# Patient Record
Sex: Female | Born: 1955 | Race: White | Hispanic: No | Smoking: Current every day smoker
Health system: Southern US, Community
[De-identification: ages and names within clinical notes are randomized; demographics above are authoritative.]

## PROBLEM LIST (undated history)

## (undated) ENCOUNTER — Emergency Department (HOSPITAL_COMMUNITY): Admission: EM | Payer: No Typology Code available for payment source | Source: Home / Self Care

## (undated) DIAGNOSIS — G40909 Epilepsy, unspecified, not intractable, without status epilepticus: Secondary | ICD-10-CM

## (undated) DIAGNOSIS — E119 Type 2 diabetes mellitus without complications: Secondary | ICD-10-CM

## (undated) DIAGNOSIS — C539 Malignant neoplasm of cervix uteri, unspecified: Secondary | ICD-10-CM

## (undated) DIAGNOSIS — C801 Malignant (primary) neoplasm, unspecified: Secondary | ICD-10-CM

## (undated) DIAGNOSIS — F419 Anxiety disorder, unspecified: Secondary | ICD-10-CM

## (undated) DIAGNOSIS — F32A Depression, unspecified: Secondary | ICD-10-CM

## (undated) DIAGNOSIS — D649 Anemia, unspecified: Secondary | ICD-10-CM

## (undated) DIAGNOSIS — F329 Major depressive disorder, single episode, unspecified: Secondary | ICD-10-CM

## (undated) DIAGNOSIS — I10 Essential (primary) hypertension: Secondary | ICD-10-CM

## (undated) HISTORY — DX: Malignant neoplasm of cervix uteri, unspecified: C53.9

## (undated) HISTORY — PX: OTHER SURGICAL HISTORY: SHX169

---

## 1999-01-08 ENCOUNTER — Emergency Department (HOSPITAL_COMMUNITY): Admission: EM | Admit: 1999-01-08 | Discharge: 1999-01-08 | Payer: Self-pay

## 1999-08-11 ENCOUNTER — Emergency Department (HOSPITAL_COMMUNITY): Admission: EM | Admit: 1999-08-11 | Discharge: 1999-08-11 | Payer: Self-pay | Admitting: Emergency Medicine

## 1999-10-24 ENCOUNTER — Emergency Department (HOSPITAL_COMMUNITY): Admission: EM | Admit: 1999-10-24 | Discharge: 1999-10-24 | Payer: Self-pay | Admitting: Emergency Medicine

## 2000-07-01 ENCOUNTER — Emergency Department (HOSPITAL_COMMUNITY): Admission: EM | Admit: 2000-07-01 | Discharge: 2000-07-01 | Payer: Self-pay | Admitting: Emergency Medicine

## 2000-07-04 ENCOUNTER — Encounter (HOSPITAL_COMMUNITY): Admission: RE | Admit: 2000-07-04 | Discharge: 2000-10-02 | Payer: Self-pay | Admitting: Emergency Medicine

## 2001-12-09 ENCOUNTER — Emergency Department (HOSPITAL_COMMUNITY): Admission: EM | Admit: 2001-12-09 | Discharge: 2001-12-09 | Payer: Self-pay | Admitting: Emergency Medicine

## 2004-08-16 ENCOUNTER — Emergency Department (HOSPITAL_COMMUNITY): Admission: EM | Admit: 2004-08-16 | Discharge: 2004-08-16 | Payer: Self-pay | Admitting: Emergency Medicine

## 2012-09-24 ENCOUNTER — Emergency Department (HOSPITAL_COMMUNITY)
Admission: EM | Admit: 2012-09-24 | Discharge: 2012-09-24 | Disposition: A | Discharge status: Home / Self Care | Payer: Self-pay | Attending: Emergency Medicine | Admitting: Emergency Medicine

## 2012-09-24 ENCOUNTER — Encounter (HOSPITAL_COMMUNITY): Payer: Self-pay | Admitting: Emergency Medicine

## 2012-09-24 DIAGNOSIS — Z8669 Personal history of other diseases of the nervous system and sense organs: Secondary | ICD-10-CM | POA: Insufficient documentation

## 2012-09-24 DIAGNOSIS — K029 Dental caries, unspecified: Secondary | ICD-10-CM | POA: Insufficient documentation

## 2012-09-24 HISTORY — DX: Epilepsy, unspecified, not intractable, without status epilepticus: G40.909

## 2012-09-24 MED ORDER — ALUM & MAG HYDROXIDE-SIMETH 200-200-20 MG/5ML PO SUSP
15.0000 mL | Freq: Once | ORAL | Status: AC
  Administered 2012-09-24: 15 mL via ORAL
  Filled 2012-09-24: qty 30

## 2012-09-24 MED ORDER — HYDROCODONE-ACETAMINOPHEN 5-325 MG PO TABS: 2.0000 | ORAL_TABLET | Freq: Four times a day (QID) | ORAL | Status: DC | PRN

## 2012-09-24 NOTE — ED Provider Notes (Signed)
Medical screening examination/treatment/procedure(s) were performed by non-physician practitioner and as supervising physician I was immediately available for consultation/collaboration.   David H Yao, MD 09/24/12 0600 

## 2012-09-24 NOTE — ED Notes (Signed)
Pt up at desk stating that she would like to leave.  Pt informed that she should be seen shortly.  Pt now back in room

## 2012-09-24 NOTE — ED Notes (Signed)
PT. REPORTS PERSISTENT LEFT UPPER MOLAR PAIN ONSET YESTERDAY UNRELIEVED BY OTC PAIN MEDICATION .

## 2012-09-24 NOTE — Discharge Instructions (Signed)
Dental Caries  Tooth decay (dental caries, cavities) is the most common of all oral diseases. It occurs in all ages but is more common in children and young adults.  CAUSES  Bacteria in your mouth combine with foods (particularly sugars and starches) to produce plaque. Plaque is a substance that sticks to the hard surfaces of teeth. The bacteria in the plaque produce acids that attack the enamel of teeth. Repeated acid attacks dissolve the enamel and create holes in the teeth. Root surfaces of teeth may also get these holes.  Other contributing factors include:   Frequent snacking and drinking of cavity-producing foods and liquids.  Poor oral hygiene.  Dry mouth.  Substance abuse such as methamphetamine.  Broken or poor fitting dental restorations.  Eating disorders.  Gastroesophageal reflux disease (GERD).  Certain radiation treatments to the head and neck. SYMPTOMS  At first, dental decay appears as white, chalky areas on the enamel. In this early stage, symptoms are seldom present. As the decay progresses, pits and holes may appear on the enamel surfaces. Progression of the decay will lead to softening of the hard layers of the tooth. At this point you may experience some pain or achy feeling after sweet, hot, or cold foods or drinks are consumed. If left untreated, the decay will reach the internal structures of the tooth and produce severe pain. Extensive dental treatment, such as root canal therapy, may be needed to save the tooth at this late stage of decay development.  DIAGNOSIS  Most cavities will be detected during regular check-ups. A thorough medical and dental history will be taken by the dentist. The dentist will use instruments to check the surfaces of your teeth for any breakdown or discoloration. Some dentists have special instruments, such as lasers, that detect tooth decay. Dental X-rays may also show some cavities that are not visible to the eye (such as between the  contact areas of the teeth). TREATMENT  Treatment involves removal of the tooth decay and replacement with a restorative material such as silver, gold, or composite (white) material. However, if the decay involves a large area of the tooth and there is little remaining healthy tooth structure, a cap (crown) will be fitted over the remaining structure. If the decay involves the center part of the tooth (pulp), root canal treatment will be needed before any type of dental restoration is placed. If the tooth is severely destroyed by the decay process, leaving the remaining tooth structures unrestorable, the tooth will need to be pulled (extracted). Some early tooth decay may be reversed by fluoride treatments and thorough brushing and flossing at home. PREVENTION   Eat healthy foods. Restrict the amount of sugary, starchy foods and liquids you consume. Avoid frequent snacking and drinking of unhealthy foods and liquids.  Sealants can help with prevention of cavities. Sealants are composite resins applied onto the biting surfaces of teeth at risk for decay. They smooth out the pits and grooves and prevent food from being trapped in them. This is done in early childhood before tooth decay has started.  Fluoride tablets may also be prescribed to children between 6 months and 4 years of age if your drinking water is not fluoridated. The fluoride absorbed by the tooth enamel makes teeth less susceptible to decay. Thorough daily cleaning with a toothbrush and dental floss is the best way to prevent cavities. Use of a fluoride toothpaste is highly recommended. Fluoride mouth rinses may be used in specific cases.  Topical application of  fluoride by your dentist is important in children.  Regular visits with a dentist for checkups and cleanings are also important. SEEK IMMEDIATE DENTAL CARE IF:  You have a fever.  You develop redness and swelling of your face, jaw, or neck.  You develop swelling around a  tooth.  You are unable to open your mouth or cannot swallow.  You have severe pain uncontrolled by pain medicine. Document Released: 02/23/2002 Document Revised: 08/26/2011 Document Reviewed: 11/08/2010 [facility] Patient Information 2013 [place], [place]. Been referred to a dentist, to please call first thing in the morning telling him that you referred through the emergency department, and they will make arrangements to see, you in the next one to 2, days

## 2012-09-24 NOTE — ED Provider Notes (Signed)
History     CSN: [id number]  Arrival date & time 09/24/12  0109   First MD Initiated Contact with Patient 09/24/12 0308      Chief Complaint  Patient presents with  . Dental Pain    (Consider location/radiation/quality/duration/timing/severity/associated sxs/prior treatment) HPI Comments: Sensation loss of feeling in her left upper second molar.  Several weeks, ago.  Pain has been increasing since that time.  She has not contacted her dentist  Patient is a 57 y.o. female presenting with tooth pain. The history is provided by the patient.  Dental PainThe primary symptoms include mouth pain. Primary symptoms do not include dental injury, headaches or fever. The symptoms began more than 1 week ago. The symptoms are worsening. The symptoms occur constantly.    Past Medical History  Diagnosis Date  . Epilepsy     History reviewed. No pertinent past surgical history.  No family history on file.  History  Substance Use Topics  . Smoking status: Not on file  . Smokeless tobacco: Not on file  . Alcohol Use: Not on file    OB History   Grav Para Term Preterm Abortions TAB SAB Ect Mult Living                  Review of Systems  Unable to perform ROS Constitutional: Negative for fever.  HENT: Positive for dental problem.   Neurological: Negative for headaches.  All other systems reviewed and are negative.    Allergies  Review of patient's allergies indicates not on file.  Home Medications   Current Outpatient Rx  Name  Route  Sig  Dispense  Refill  . HYDROcodone-acetaminophen (NORCO/VICODIN) 5-325 MG per tablet   Oral   Take 2 tablets by mouth every 6 (six) hours as needed for pain.   30 tablet   0     BP 136/87  Pulse 87  Temp(Src) 98.5 F (36.9 C) (Oral)  Resp 14  SpO2 97%  Physical Exam  Constitutional: She is oriented to person, place, and time. She appears well-developed and well-nourished.  HENT:  Head: Normocephalic and atraumatic.    Mouth/Throat:    Eyes: Pupils are equal, round, and reactive to light.  Neck: Normal range of motion.  Cardiovascular: Normal rate.   Pulmonary/Chest: Effort normal.  Musculoskeletal: Normal range of motion.  Lymphadenopathy:    She has no cervical adenopathy.  Neurological: She is alert and oriented to person, place, and time.    ED Course  Dental Date/Time: 09/24/2012 3:49 AM Performed by: Arman Filter Authorized by: Arman Filter Consent: Verbal consent obtained. Risks and benefits: risks, benefits and alternatives were discussed Consent given by: patient Patient understanding: patient states understanding of the procedure being performed Patient identity confirmed: verbally with patient Time out: Immediately prior to procedure a "time out" was called to verify the correct patient, procedure, equipment, support staff and site/side marked as required. Patient tolerance: Patient tolerated the procedure well with no immediate complications.   (including critical care time)  Labs Reviewed - No data to display No results found.   1. Dental cavity       MDM  Dental block performed patient tolerated procedure well.  She is referred to Dr. Dr. Russella Dar for followup in the morning        Arman Filter, NP 09/24/12 0349  Arman Filter, NP 09/24/12 0350  Arman Filter, NP 09/24/12 [phone]

## 2013-10-29 ENCOUNTER — Encounter (HOSPITAL_COMMUNITY): Payer: Self-pay | Admitting: Emergency Medicine

## 2013-10-29 ENCOUNTER — Emergency Department (HOSPITAL_COMMUNITY)
Admission: EM | Admit: 2013-10-29 | Discharge: 2013-10-29 | Discharge status: Against Medical Advice | Payer: Self-pay | Attending: Emergency Medicine | Admitting: Emergency Medicine

## 2013-10-29 DIAGNOSIS — R52 Pain, unspecified: Secondary | ICD-10-CM | POA: Insufficient documentation
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
## 2013-10-29 NOTE — ED Notes (Signed)
Pt is displaying manic behavior, flight of ideas.

## 2013-10-29 NOTE — ED Notes (Signed)
Pt smoking in her room.  Explained that she could not smoke in the hospital.  Pt voices understanding and apologizes for same.  Cigs. Removed and placed at nurses station.  Pt st's that she needs to leave due to  Having a MR child at home alone and needs to get back to him.  AMA signed and pt left to catch a bus home.

## 2013-10-29 NOTE — ED Notes (Signed)
Pt states her PCP is "Dr. Darrol Poke". States he was supposed to put her in a nursing home.

## 2013-11-08 ENCOUNTER — Encounter (HOSPITAL_COMMUNITY): Payer: Self-pay | Admitting: Emergency Medicine

## 2013-11-08 ENCOUNTER — Emergency Department (HOSPITAL_COMMUNITY)
Admission: EM | Admit: 2013-11-08 | Discharge: 2013-11-08 | Disposition: A | Discharge status: Home / Self Care | Payer: Self-pay | Attending: Emergency Medicine | Admitting: Emergency Medicine

## 2013-11-08 DIAGNOSIS — Z79899 Other long term (current) drug therapy: Secondary | ICD-10-CM | POA: Insufficient documentation

## 2013-11-08 DIAGNOSIS — Z880 Allergy status to penicillin: Secondary | ICD-10-CM | POA: Insufficient documentation

## 2013-11-08 DIAGNOSIS — G40909 Epilepsy, unspecified, not intractable, without status epilepticus: Secondary | ICD-10-CM | POA: Insufficient documentation

## 2013-11-08 DIAGNOSIS — R21 Rash and other nonspecific skin eruption: Secondary | ICD-10-CM | POA: Insufficient documentation

## 2013-11-08 MED ORDER — HYDROCORTISONE 2.5 % EX LOTN: TOPICAL_LOTION | Freq: Two times a day (BID) | CUTANEOUS | Status: DC

## 2013-11-08 NOTE — ED Provider Notes (Signed)
CSN: [id number]     Arrival date & time 11/08/13  1248 History   First MD Initiated Contact with Patient 11/08/13 1328    This chart was scribed for Margarita Sermons, a non-physician practitioner working with Threasa Beards, MD by Denice Bors, ED Scribe. This patient was seen in room TR08C/TR08C and the patient's care was started at 2:40 PM     Chief Complaint  Patient presents with  . Facial Pain     (Consider location/radiation/quality/duration/timing/severity/associated sxs/prior Treatment) The history is provided by the patient. No language interpreter was used.   HPI Comments: Latoya Green is a 58 y.o. female who presents to the Emergency Department complaining of persistent left facial rash onset 18 months. Describes rash as gradually improving in severity and mildly pruritic.  Reports associated redness. Denies homicidal ideations and suicidal ideations. Denies associated fever, itchiness, chills, nausea, emesis, drainage, and recent travel. States she was evaluated by PCP for same.   Past Medical History  Diagnosis Date  . Epilepsy    History reviewed. No pertinent past surgical history. History reviewed. No pertinent family history. History  Substance Use Topics  . Smoking status: Not on file  . Smokeless tobacco: Not on file  . Alcohol Use: No   OB History   Grav Para Term Preterm Abortions TAB SAB Ect Mult Living                 Review of Systems  Constitutional: Negative for fever and chills.  Skin: Positive for rash.      Allergies  Penicillins  Home Medications   Prior to Admission medications   Medication Sig Start Date End Date Taking? Authorizing Provider  carbamazepine (CARBATROL) 200 MG 12 hr capsule Take 200 mg by mouth 2 (two) times daily.   Yes Historical Provider, MD  sertraline (ZOLOFT) 100 MG tablet Take 100 mg by mouth daily.   Yes Historical Provider, MD   BP 145/85  Pulse 99  Temp(Src) 98.3 F (36.8 C) (Oral)  Resp 18   SpO2 98% Physical Exam  Nursing note and vitals reviewed. Constitutional: She is oriented to person, place, and time. She appears well-developed and well-nourished. No distress.  HENT:  Head: Normocephalic and atraumatic.  Mouth/Throat: Uvula is midline, oropharynx is clear and moist and mucous membranes are normal. No oropharyngeal exudate, posterior oropharyngeal edema or posterior oropharyngeal erythema.  Eyes: EOM are normal.  Neck: Neck supple.  Cardiovascular: Normal rate and regular rhythm.   Pulmonary/Chest: Effort normal and breath sounds normal. No respiratory distress.  Musculoskeletal: Normal range of motion.  Neurological: She is alert and oriented to person, place, and time.  Skin: Skin is warm and dry. Rash noted. There is erythema.  Left face: Erythematous, scaly papular rash (several papules)  Psychiatric: She has a normal mood and affect. Her behavior is normal.    ED Course  Procedures (including critical care time) COORDINATION OF CARE:  Nursing notes reviewed. Vital signs reviewed. Initial pt interview and examination performed.   Filed Vitals:   11/08/13 1316  BP: 145/85  Pulse: 99  Temp: 98.3 F (36.8 C)  TempSrc: Oral  Resp: 18  SpO2: 98%    2:50 PM-Discussed work up plan with pt at bedside, which includes No orders of the defined types were placed in this encounter.  . Pt agrees with plan.   Treatment plan initiated:Medications - No data to display   Initial diagnostic testing ordered.      Labs Review Labs Reviewed -  No data to display  Imaging Review No results found.   EKG Interpretation None      MDM   Final diagnoses:  None   Patient presenting with a pruritic rash to the left side of the face that has been present for 18 months.  No signs of infection.  No swelling of the lips, tongue, or throat.  Patient stable for discharge.  Patient instructed to follow up with her PCP or Dermatology.   Hyman Bible, PA-C 11/08/13  [phone]

## 2013-11-08 NOTE — ED Notes (Signed)
Patient states that she needs to be placed into a skilled nursing facility because "noone will hire me with this"  - patient was pointing to face.   Patient states was told that if she "picked out a nursing home, you will get me in there".

## 2013-11-08 NOTE — ED Notes (Signed)
Pt reports having rash and pain to left side of face x 18 months. Airway intact, no distress noted.

## 2013-11-11 NOTE — ED Provider Notes (Signed)
Medical screening examination/treatment/procedure(s) were performed by non-physician practitioner and as supervising physician I was immediately available for consultation/collaboration.   EKG Interpretation None       Threasa Beards, MD 11/11/13 [phone]

## 2013-12-04 ENCOUNTER — Emergency Department (HOSPITAL_COMMUNITY)
Admission: EM | Admit: 2013-12-04 | Discharge: 2013-12-06 | Disposition: A | Discharge status: Home / Self Care | Payer: Self-pay | Attending: Emergency Medicine | Admitting: Emergency Medicine

## 2013-12-04 ENCOUNTER — Encounter (HOSPITAL_COMMUNITY): Payer: Self-pay | Admitting: Emergency Medicine

## 2013-12-04 DIAGNOSIS — F3289 Other specified depressive episodes: Secondary | ICD-10-CM | POA: Insufficient documentation

## 2013-12-04 DIAGNOSIS — F17200 Nicotine dependence, unspecified, uncomplicated: Secondary | ICD-10-CM | POA: Insufficient documentation

## 2013-12-04 DIAGNOSIS — F411 Generalized anxiety disorder: Secondary | ICD-10-CM | POA: Insufficient documentation

## 2013-12-04 DIAGNOSIS — F329 Major depressive disorder, single episode, unspecified: Secondary | ICD-10-CM | POA: Insufficient documentation

## 2013-12-04 DIAGNOSIS — R45851 Suicidal ideations: Secondary | ICD-10-CM

## 2013-12-04 HISTORY — DX: Malignant (primary) neoplasm, unspecified: C80.1

## 2013-12-04 LAB — COMPREHENSIVE METABOLIC PANEL
ALBUMIN: 3.8 g/dL (ref 3.5–5.2)
ALT: 12 U/L (ref 0–35)
AST: 20 U/L (ref 0–37)
Alkaline Phosphatase: 101 U/L (ref 39–117)
BUN: 7 mg/dL (ref 6–23)
CALCIUM: 9.6 mg/dL (ref 8.4–10.5)
CO2: 21 mEq/L (ref 19–32)
CREATININE: 0.65 mg/dL (ref 0.50–1.10)
Chloride: 98 mEq/L (ref 96–112)
GFR calc Af Amer: >90 mL/min (ref >90)
GFR calc non Af Amer: >90 mL/min (ref >90)
Glucose, Bld: 176 mg/dL — ABNORMAL HIGH (ref 70–99)
Potassium: 3.6 mEq/L — ABNORMAL LOW (ref 3.7–5.3)
Sodium: 136 mEq/L — ABNORMAL LOW (ref 137–147)
TOTAL PROTEIN: 7.6 g/dL (ref 6.0–8.3)
Total Bilirubin: 0.2 mg/dL — ABNORMAL LOW (ref 0.3–1.2)

## 2013-12-04 LAB — RAPID URINE DRUG SCREEN, HOSP PERFORMED
AMPHETAMINES: NOT DETECTED
BENZODIAZEPINES: NOT DETECTED
Barbiturates: NOT DETECTED
Cocaine: NOT DETECTED
OPIATES: NOT DETECTED
Tetrahydrocannabinol: NOT DETECTED

## 2013-12-04 LAB — CBC
HCT: 37.9 % (ref 36.0–46.0)
Hemoglobin: 12.2 g/dL (ref 12.0–15.0)
MCH: 24.1 pg — AB (ref 26.0–34.0)
MCHC: 32.2 g/dL (ref 30.0–36.0)
MCV: 74.8 fL — AB (ref 78.0–100.0)
PLATELETS: 321 10*3/uL (ref 150–400)
RBC: 5.07 MIL/uL (ref 3.87–5.11)
RDW: 16.6 % — AB (ref 11.5–15.5)
WBC: 10.2 10*3/uL (ref 4.0–10.5)

## 2013-12-04 LAB — SALICYLATE LEVEL

## 2013-12-04 LAB — ACETAMINOPHEN LEVEL: Acetaminophen (Tylenol), Serum: <15 ug/mL (ref 10–30)

## 2013-12-04 LAB — ETHANOL: Alcohol, Ethyl (B): <11 mg/dL (ref 0–11)

## 2013-12-04 MED ORDER — SERTRALINE HCL 50 MG PO TABS
100.0000 mg | ORAL_TABLET | Freq: Every day | ORAL | Status: DC
  Administered 2013-12-05 – 2013-12-06 (×2): 100 mg via ORAL
  Filled 2013-12-04 (×3): qty 2

## 2013-12-04 MED ORDER — HYDROCORTISONE 2.5 % EX LOTN: TOPICAL_LOTION | Freq: Two times a day (BID) | CUTANEOUS | Status: DC

## 2013-12-04 MED ORDER — NICOTINE 21 MG/24HR TD PT24
21.0000 mg | MEDICATED_PATCH | Freq: Every day | TRANSDERMAL | Status: DC
  Administered 2013-12-05 – 2013-12-06 (×2): 21 mg via TRANSDERMAL

## 2013-12-04 MED ORDER — CARBAMAZEPINE ER 200 MG PO CP12
200.0000 mg | ORAL_CAPSULE | Freq: Two times a day (BID) | ORAL | Status: DC
  Administered 2013-12-04 – 2013-12-06 (×4): 200 mg via ORAL
  Filled 2013-12-04 (×8): qty 1

## 2013-12-04 MED ORDER — LORAZEPAM 1 MG PO TABS
1.0000 mg | ORAL_TABLET | Freq: Once | ORAL | Status: AC
  Administered 2013-12-04: 1 mg via ORAL
  Filled 2013-12-04: qty 1

## 2013-12-04 MED ORDER — HYDROCORTISONE 2.5 % RE CREA
TOPICAL_CREAM | Freq: Two times a day (BID) | RECTAL | Status: DC
  Administered 2013-12-04 – 2013-12-06 (×4): via TOPICAL
  Filled 2013-12-04 (×2): qty 28.35

## 2013-12-04 MED ORDER — IBUPROFEN 200 MG PO TABS: 600.0000 mg | ORAL_TABLET | Freq: Three times a day (TID) | ORAL | Status: DC | PRN

## 2013-12-04 MED ORDER — ACETAMINOPHEN 325 MG PO TABS: 650.0000 mg | ORAL_TABLET | ORAL | Status: DC | PRN

## 2013-12-04 MED ORDER — ZOLPIDEM TARTRATE 5 MG PO TABS: 5.0000 mg | ORAL_TABLET | Freq: Every evening | ORAL | Status: DC | PRN

## 2013-12-04 MED ORDER — ONDANSETRON HCL 4 MG PO TABS: 4.0000 mg | ORAL_TABLET | Freq: Three times a day (TID) | ORAL | Status: DC | PRN

## 2013-12-04 MED ORDER — HYDROCORTISONE 1 % EX LOTN
TOPICAL_LOTION | Freq: Two times a day (BID) | CUTANEOUS | Status: DC
  Filled 2013-12-04: qty 118

## 2013-12-04 MED ORDER — CARBAMAZEPINE ER 200 MG PO CP12: 200.0000 mg | ORAL_CAPSULE | Freq: Two times a day (BID) | ORAL | Status: DC
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
## 2013-12-04 NOTE — ED Notes (Signed)
Latoya Green with social work to check into situation regarding pts son and care at home for him.
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
## 2013-12-04 NOTE — ED Notes (Signed)
She states she wants to kill herself because she states "im afraid im going to die down here in this [place] and no one will help me to get back home up [place]." she states she would use a gun to kill herself if she had one. Denies HI. She denies substance abuse. She is A&OX4, resp e/u

## 2013-12-04 NOTE — Progress Notes (Addendum)
Weekend CSW made APS report with on-call worker Kenyatta regarding son Latoya Green who is at home without a caregiver.  Tilden Fossa, MSW, [place] Clinical Social Worker [facility] Emergency Dept. [phone]

## 2013-12-04 NOTE — ED Notes (Signed)
Valuables placed in envelope [phone]

## 2013-12-04 NOTE — ED Notes (Signed)
Pt son's name Latoya Green", 58 YO female at [facility] [phone], pt states he is an invalid. Clover Mealy, CSW in charge of case

## 2013-12-04 NOTE — Progress Notes (Signed)
Weekend CSW informed that patient caring for disabled son at home and there is no one to care for him while she is in the hospital. CSW met with patient who states that she is at the hospital due to suicidal ideations. Patient endorsed suicidal thoughts several times during the conversation. Patient stated that she has been the sole caregiver for her adult son who requires total care. Patient denies that there is any family or friends who can look in on her son. Patient reports that son is IDD. CSW informed patient that CSW will be required to contact APS to intervene. Patient reports that she cannot care for him anymore.   Weekend CSW contacted on-call APS worker, awaiting call back.   Tilden Fossa, MSW, [place] Clinical Social Worker [facility] Emergency Dept. [phone]

## 2013-12-05 ENCOUNTER — Emergency Department (HOSPITAL_COMMUNITY): Payer: Self-pay

## 2013-12-05 LAB — CARBAMAZEPINE LEVEL, TOTAL: Carbamazepine Lvl: 4.9 ug/mL (ref 4.0–12.0)

## 2013-12-05 LAB — PREGNANCY, URINE: Preg Test, Ur: NEGATIVE

## 2013-12-05 MED ORDER — LOPERAMIDE HCL 2 MG PO CAPS
2.0000 mg | ORAL_CAPSULE | Freq: Once | ORAL | Status: AC
  Administered 2013-12-05: 2 mg via ORAL
  Filled 2013-12-05: qty 1

## 2013-12-05 NOTE — ED Provider Notes (Signed)
Psych recs InPt for SI; Pt sleeping. 0900  Babette Relic, MD 12/06/13 [phone]

## 2013-12-05 NOTE — BH Assessment (Signed)
[place] Assessment Progress Note  The following facilities were called in an attempt to place the pt:  [facility] - left message and faxed referral for review @ [facility] - no answer @ [facility] [place] - left message @ [facility] - fax referral per Suzie @ 1029 - referral faxed for review [facility] - no beds per [facility] @ [place] no beds per [facility] @ [place] - no beds per [facility] @ [facility] [place] - no beds per [facility] @ 1035  TTS will continue to seek placement for the pt.  Shaune Pascal, MS, [facility] Licensed Professional Counselor Triage Specialist

## 2013-12-05 NOTE — ED Provider Notes (Signed)
Medical screening examination/treatment/procedure(s) were performed by non-physician practitioner and as supervising physician I was immediately available for consultation/collaboration.     Veryl Speak, MD 12/05/13 [phone]

## 2013-12-05 NOTE — Progress Notes (Signed)
Bill from [facility] called requesting additional information for review including UDS, Pregnancy, medications taking for epiliepsy, type of cancer and how long in remission and image of EKG.  MHT contacted [place], all information was faxed and MHT unable to locate EKG in Epic completed on 12/05/13.  [facility] Psych to continue to work on finding EKG test.  All information faxed needed for pt review for acceptance.  Wyvonnia Dusky, MHT/NS

## 2013-12-05 NOTE — ED Provider Notes (Signed)
Behavioral health requested screening EKG and chest x-ray prior to placement. Chest x-ray results reviewed no acute findings. EKG reviewed heart rate 81, mild prolonged QT, normal axis, right atrial enlargement, sinus, no acute ST changes.  Mariea Clonts   Mariea Clonts, MD 12/05/13 [phone]

## 2013-12-05 NOTE — Progress Notes (Signed)
MHT initiated psychiatric placement at the following facilities with bed availability:  1)[facility]-faxed referral 2)[facility]-no answer 3)[facility]-faxed referral 4)[facility]-faxed referral 5)[facility] beds [facility] beds 7)[facility]-faxed referral  Wyvonnia Dusky, MHT/NS

## 2013-12-05 NOTE — ED Notes (Addendum)
Pt belongings taken to ED locker and valuables placed in safe box 13

## 2013-12-05 NOTE — Progress Notes (Signed)
Weekend CSW contacted [facility] to inquire if patient's son has a care coordinator in hopes to inform care coordinator that son is home alone. [place] staff were unable to find patient in system due to limited information.   CSW met with patient to get more information. Patient states that although she has been in contact with [facility] in the past, her son does not have a care coordinator. Patient does have a Medicaid Education officer, museum though DSS. Patient again denied having any other family or agency involvement with son.   CSW requested non-emergency police assistance to perform a wellness check on son.   Tilden Fossa, MSW, [place] Clinical Social Worker [facility] Emergency Dept. [phone]

## 2013-12-05 NOTE — ED Notes (Signed)
EKG faxed to TTS [facility], UA and Tegretol level added to labs.

## 2013-12-05 NOTE — Discharge Instructions (Signed)
Move patient to [facility] psych ED.

## 2013-12-05 NOTE — ED Provider Notes (Addendum)
Pt alert, content, interacting w staff. Vitals stable. Psych team has requests pt be moved to [facility] psych ED for their evaluation/tx there. Discussed w EDP [place] at [facility].      Mirna Mires, MD 12/05/13 [phone]
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
## 2013-12-06 LAB — URINALYSIS, ROUTINE W REFLEX MICROSCOPIC
Bilirubin Urine: NEGATIVE
GLUCOSE, UA: NEGATIVE mg/dL
HGB URINE DIPSTICK: NEGATIVE
KETONES UR: NEGATIVE mg/dL
Nitrite: NEGATIVE
Protein, ur: NEGATIVE mg/dL
Specific Gravity, Urine: 1.01 (ref 1.005–1.030)
Urobilinogen, UA: 0.2 mg/dL (ref 0.0–1.0)
pH: 6.5 (ref 5.0–8.0)

## 2013-12-06 LAB — URINE MICROSCOPIC-ADD ON

## 2013-12-06 NOTE — BH Assessment (Signed)
Received call from [facility] at [facility] stating that Dr. Johnn Hai has accepted Pt to their facility AFTER 0800. Nursing report should be called to [phone]. Nataki asked she be called at [phone] when the Pt is ready for transport. Notified Dr. Julianne Rice and Miguel Rota, RN of acceptance.  Orpah Greek Rosana Hoes, [facility] Triage Specialist [phone]

## 2013-12-06 NOTE — BH Assessment (Addendum)
Reassessment completed. Pt continues to endorse suicidal ideations. Pt did not report any HI, AH or VH at this time. Pt did not report any issues with her medication, appetite or sleep. Inpatient treatment is recommended.

## 2013-12-06 NOTE — BH Assessment (Signed)
Spoke with Fraser Din, Biomedical engineer at [facility]. TTS requesting to speak with Carla in intake/admissions. Angela Nevin is not available and Fraser Din will ensure that Angela Nevin contacts TTS when she is available regarding this pt's acceptance to [facility].  Shaune Pollack, MS, [place] Assessment Counselor

## 2013-12-06 NOTE — BHH Counselor (Addendum)
Call back from [place]. Pt can be transferred now once RN calls report.  Arnold Long, [place] Assessment Counselor   [place] nurses' [phone] sts they are not aware of pt having been accepted to [facility]. Writer explained that pt was accepted by Dr. Johnn Hai per [place]. Writer told to call [facility] Dept. The correct phone number for [facility] is [phone] x [phone]. Medical laboratory scientific officer. Call from [place] at [facility]. She says pt has been accepted. She sts that she still needs to call NP to double check acceptance and that NP will give bed #. Angela Nevin will call writer back w/ bed #.   Arnold Long, [place] Assessment Counselor [phone]

## 2013-12-06 NOTE — Progress Notes (Signed)
[facility] CL did not get to see patient but will be sending information about [facility], using the address provided.

## 2013-12-06 NOTE — BH Assessment (Signed)
Confirmed with Candice Camp at [facility] that pt has been transferred to [facility] for inpatient treatment.  Shaune Pollack, MS, [place] Assessment Counselor

## 2013-12-06 NOTE — Progress Notes (Signed)
MHT initiated bed placement at the following facilities with bed availability:  1)[facility]-faxed pregnancy, UDS, EKG, Tegerol level; requested UA be faxed 2)[facility]-will review in AM with MD 3)[facility]-faxed referral  Wyvonnia Dusky, MHT/NS

## 2013-12-06 NOTE — Progress Notes (Signed)
MHT awaiting a UA to be completed for [facility] or [facility] to consider for AM admission.  Wyvonnia Dusky, MHT/NS

## 2013-12-06 NOTE — ED Notes (Signed)
[place], TTS called to report pt accepted to [facility], Dr Jake Samples accepting MD, pt not able to be transferred until after 8am.  RN to RN report, [phone].

## 2013-12-27 ENCOUNTER — Encounter (HOSPITAL_COMMUNITY): Payer: Self-pay | Admitting: Emergency Medicine

## 2013-12-27 ENCOUNTER — Emergency Department (HOSPITAL_COMMUNITY)
Admission: EM | Admit: 2013-12-27 | Discharge: 2013-12-28 | Disposition: A | Discharge status: Home / Self Care | Payer: Self-pay | Attending: Emergency Medicine | Admitting: Emergency Medicine

## 2013-12-27 DIAGNOSIS — Z859 Personal history of malignant neoplasm, unspecified: Secondary | ICD-10-CM | POA: Insufficient documentation

## 2013-12-27 DIAGNOSIS — G40909 Epilepsy, unspecified, not intractable, without status epilepticus: Secondary | ICD-10-CM | POA: Insufficient documentation

## 2013-12-27 DIAGNOSIS — F17200 Nicotine dependence, unspecified, uncomplicated: Secondary | ICD-10-CM | POA: Insufficient documentation

## 2013-12-27 DIAGNOSIS — Z880 Allergy status to penicillin: Secondary | ICD-10-CM | POA: Insufficient documentation

## 2013-12-27 DIAGNOSIS — Z0389 Encounter for observation for other suspected diseases and conditions ruled out: Secondary | ICD-10-CM | POA: Insufficient documentation

## 2013-12-27 DIAGNOSIS — Z711 Person with feared health complaint in whom no diagnosis is made: Secondary | ICD-10-CM

## 2013-12-27 DIAGNOSIS — Z591 Inadequate housing, unspecified: Secondary | ICD-10-CM | POA: Insufficient documentation

## 2013-12-27 DIAGNOSIS — Z79899 Other long term (current) drug therapy: Secondary | ICD-10-CM | POA: Insufficient documentation
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
## 2013-12-28 ENCOUNTER — Emergency Department (HOSPITAL_COMMUNITY)
Admission: EM | Admit: 2013-12-28 | Discharge: 2013-12-29 | Disposition: A | Discharge status: Other Acute Inpatient Hospital | Payer: Self-pay | Attending: Emergency Medicine | Admitting: Emergency Medicine

## 2013-12-28 ENCOUNTER — Encounter (HOSPITAL_COMMUNITY): Payer: Self-pay | Admitting: Emergency Medicine

## 2013-12-28 DIAGNOSIS — F3289 Other specified depressive episodes: Secondary | ICD-10-CM | POA: Insufficient documentation

## 2013-12-28 DIAGNOSIS — F329 Major depressive disorder, single episode, unspecified: Secondary | ICD-10-CM | POA: Insufficient documentation

## 2013-12-28 DIAGNOSIS — F32A Depression, unspecified: Secondary | ICD-10-CM

## 2013-12-28 DIAGNOSIS — Z859 Personal history of malignant neoplasm, unspecified: Secondary | ICD-10-CM | POA: Insufficient documentation

## 2013-12-28 DIAGNOSIS — F323 Major depressive disorder, single episode, severe with psychotic features: Secondary | ICD-10-CM | POA: Diagnosis present

## 2013-12-28 DIAGNOSIS — R45851 Suicidal ideations: Secondary | ICD-10-CM | POA: Insufficient documentation

## 2013-12-28 DIAGNOSIS — G40909 Epilepsy, unspecified, not intractable, without status epilepticus: Secondary | ICD-10-CM | POA: Insufficient documentation

## 2013-12-28 DIAGNOSIS — Z79899 Other long term (current) drug therapy: Secondary | ICD-10-CM | POA: Insufficient documentation

## 2013-12-28 DIAGNOSIS — Z880 Allergy status to penicillin: Secondary | ICD-10-CM | POA: Insufficient documentation

## 2013-12-28 DIAGNOSIS — F17200 Nicotine dependence, unspecified, uncomplicated: Secondary | ICD-10-CM | POA: Insufficient documentation

## 2013-12-28 LAB — RAPID URINE DRUG SCREEN, HOSP PERFORMED
AMPHETAMINES: NOT DETECTED
Barbiturates: NOT DETECTED
Benzodiazepines: NOT DETECTED
Cocaine: NOT DETECTED
OPIATES: NOT DETECTED
Tetrahydrocannabinol: NOT DETECTED

## 2013-12-28 LAB — CARBAMAZEPINE LEVEL, TOTAL: Carbamazepine Lvl: 5.2 ug/mL (ref 4.0–12.0)

## 2013-12-28 LAB — CBC WITH DIFFERENTIAL/PLATELET
Basophils Absolute: 0 10*3/uL (ref 0.0–0.1)
Basophils Relative: 0 % (ref 0–1)
EOS ABS: 0.1 10*3/uL (ref 0.0–0.7)
Eosinophils Relative: 1 % (ref 0–5)
HCT: 37.7 % (ref 36.0–46.0)
HEMOGLOBIN: 11.8 g/dL — AB (ref 12.0–15.0)
LYMPHS ABS: 1.3 10*3/uL (ref 0.7–4.0)
Lymphocytes Relative: 17 % (ref 12–46)
MCH: 22.7 pg — ABNORMAL LOW (ref 26.0–34.0)
MCHC: 31.3 g/dL (ref 30.0–36.0)
MCV: 72.5 fL — AB (ref 78.0–100.0)
MONOS PCT: 4 % (ref 3–12)
Monocytes Absolute: 0.3 10*3/uL (ref 0.1–1.0)
Neutro Abs: 6 10*3/uL (ref 1.7–7.7)
Neutrophils Relative %: 78 % — ABNORMAL HIGH (ref 43–77)
Platelets: 328 10*3/uL (ref 150–400)
RBC: 5.2 MIL/uL — AB (ref 3.87–5.11)
RDW: 16.2 % — ABNORMAL HIGH (ref 11.5–15.5)
WBC: 7.7 10*3/uL (ref 4.0–10.5)

## 2013-12-28 LAB — BASIC METABOLIC PANEL
Anion gap: 17 — ABNORMAL HIGH (ref 5–15)
BUN: 8 mg/dL (ref 6–23)
CHLORIDE: 100 meq/L (ref 96–112)
CO2: 21 mEq/L (ref 19–32)
Calcium: 9.5 mg/dL (ref 8.4–10.5)
Creatinine, Ser: 0.64 mg/dL (ref 0.50–1.10)
GFR calc Af Amer: >90 mL/min (ref >90)
GLUCOSE: 151 mg/dL — AB (ref 70–99)
Potassium: 4 mEq/L (ref 3.7–5.3)
Sodium: 138 mEq/L (ref 137–147)

## 2013-12-28 LAB — ETHANOL: Alcohol, Ethyl (B): <11 mg/dL (ref 0–11)

## 2013-12-28 MED ORDER — ONDANSETRON HCL 4 MG PO TABS: 4.0000 mg | ORAL_TABLET | Freq: Three times a day (TID) | ORAL | Status: DC | PRN

## 2013-12-28 MED ORDER — NICOTINE 21 MG/24HR TD PT24: 21.0000 mg | MEDICATED_PATCH | Freq: Every day | TRANSDERMAL | Status: DC | PRN

## 2013-12-28 MED ORDER — ALUM & MAG HYDROXIDE-SIMETH 200-200-20 MG/5ML PO SUSP: 30.0000 mL | ORAL | Status: DC | PRN

## 2013-12-28 MED ORDER — IBUPROFEN 200 MG PO TABS: 400.0000 mg | ORAL_TABLET | Freq: Three times a day (TID) | ORAL | Status: DC | PRN

## 2013-12-28 MED ORDER — ACETAMINOPHEN 325 MG PO TABS: 650.0000 mg | ORAL_TABLET | ORAL | Status: DC | PRN

## 2013-12-28 MED ORDER — LORAZEPAM 1 MG PO TABS
1.0000 mg | ORAL_TABLET | Freq: Three times a day (TID) | ORAL | Status: DC | PRN
  Administered 2013-12-28: 1 mg via ORAL
  Filled 2013-12-28 (×2): qty 1

## 2013-12-28 NOTE — Progress Notes (Signed)
Writer informed the TTS Counselor stationed at [employer] ED Estill [place])

## 2013-12-28 NOTE — ED Notes (Addendum)
Per EMS, Pt, from home, c/o SI, but could not give specific plan.  EMS reports that Pt has a disabled son that she cares for everyday and seems overwhelmed.  Hx of depression.     Upon assessment, Pt reports SI x several weeks w/ several different plans, including a knife and stopping medications.  Sts she used to see a psychiatrist for depression.  Sts "my son is going to have to go to a home.  I can't care for him anymore.  I called my neighbor after calling EMS and gave her the Social Worker's number.  She should be watching him."
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
## 2013-12-28 NOTE — ED Provider Notes (Signed)
CSN: [id number]     Arrival date & time 12/28/13  1551 History   First MD Initiated Contact with Patient 12/28/13 1601     Chief Complaint  Patient presents with  . Suicidal      HPI Pt was seen at 1600. Per pt, c/o gradual onset and worsening of persistent depression for the past several weeks. Pt endorses SI with several different plans (stopping her seizure medications, stabbing self with knife). States she can't take care of her disabled son anymore. EMS felt pt seemed "overwhelmed" on their arrival to scene. Denies SA, no HI, no hallucinations.    Past Medical History  Diagnosis Date  . Epilepsy   . Cancer    History reviewed. No pertinent past surgical history.  History  Substance Use Topics  . Smoking status: Current Every Day Smoker -- 1.00 packs/day    Types: Cigarettes  . Smokeless tobacco: Not on file  . Alcohol Use: No    Review of Systems ROS: Statement: All systems negative except as marked or noted in the HPI; Constitutional: Negative for fever and chills. ; ; Eyes: Negative for eye pain, redness and discharge. ; ; ENMT: Negative for ear pain, hoarseness, nasal congestion, sinus pressure and sore throat. ; ; Cardiovascular: Negative for chest pain, palpitations, diaphoresis, dyspnea and peripheral edema. ; ; Respiratory: Negative for cough, wheezing and stridor. ; ; Gastrointestinal: Negative for nausea, vomiting, diarrhea, abdominal pain, blood in stool, hematemesis, jaundice and rectal bleeding. . ; ; Genitourinary: Negative for dysuria, flank pain and hematuria. ; ; Musculoskeletal: Negative for back pain and neck pain. Negative for swelling and trauma.; ; Skin: Negative for pruritus, rash, abrasions, blisters, bruising and skin lesion.; ; Neuro: Negative for headache, lightheadedness and neck stiffness. Negative for weakness, altered level of consciousness , altered mental status, extremity weakness, paresthesias, involuntary movement, seizure and syncope.; Psych:   +depression, +SI. No SA, no HI, no hallucinations.     Allergies  Penicillins  Home Medications   Prior to Admission medications   Medication Sig Start Date End Date Taking? Authorizing Provider  acetaminophen (TYLENOL) 500 MG tablet Take 500-1,000 mg by mouth every 6 (six) hours as needed for moderate pain.   Yes Historical Provider, MD  carbamazepine (CARBATROL) 200 MG 12 hr capsule Take 200 mg by mouth 2 (two) times daily.   Yes Historical Provider, MD  sertraline (ZOLOFT) 100 MG tablet Take 100 mg by mouth daily.   Yes Historical Provider, MD  tolnaftate (ANTIFUNGAL) 1 % cream Apply 1 application topically 2 (two) times daily as needed. spot on face   Yes Historical Provider, MD   BP 129/85  Pulse 111  Temp(Src) 98.3 F (36.8 C) (Oral)  Resp 18  SpO2 96% Physical Exam 1605: Physical examination:  Nursing notes reviewed; Vital signs and O2 SAT reviewed;  Constitutional: Well developed, Well nourished, Well hydrated, In no acute distress; Head:  Normocephalic, atraumatic; Eyes: EOMI, PERRL, No scleral icterus; ENMT: Mouth and pharynx normal, Mucous membranes moist; Neck: Supple, Full range of motion, No lymphadenopathy; Cardiovascular: Regular rate and rhythm, No murmur, rub, or gallop; Respiratory: Breath sounds clear & equal bilaterally, No rales, rhonchi, wheezes.  Speaking full sentences with ease, Normal respiratory effort/excursion; Chest: Nontender, Movement normal; Abdomen: Soft, Nontender, Nondistended, Normal bowel sounds; Genitourinary: No CVA tenderness; Extremities: Pulses normal, No tenderness, No edema, No calf edema or asymmetry.; Neuro: AA&Ox3, Major CN grossly intact.  Speech clear. No gross focal motor or sensory deficits in extremities.; Skin: Color  normal, Warm, Dry.; Psych:  Affect flat, poor eye contact.    ED Course  Procedures     MDM  MDM Reviewed: previous chart, nursing note and vitals Reviewed previous: labs Interpretation: labs   Results for  orders placed during the hospital encounter of 12/28/13  ETHANOL      Result Value Ref Range   Alcohol, Ethyl (B) <11  0 - 11 mg/dL  URINE RAPID DRUG SCREEN (HOSP PERFORMED)      Result Value Ref Range   Opiates NONE DETECTED  NONE DETECTED   Cocaine NONE DETECTED  NONE DETECTED   Benzodiazepines NONE DETECTED  NONE DETECTED   Amphetamines NONE DETECTED  NONE DETECTED   Tetrahydrocannabinol NONE DETECTED  NONE DETECTED   Barbiturates NONE DETECTED  NONE DETECTED  BASIC METABOLIC PANEL      Result Value Ref Range   Sodium 138  137 - 147 mEq/L   Potassium 4.0  3.7 - 5.3 mEq/L   Chloride 100  96 - 112 mEq/L   CO2 21  19 - 32 mEq/L   Glucose, Bld 151 (*) 70 - 99 mg/dL   BUN 8  6 - 23 mg/dL   Creatinine, Ser 0.64  0.50 - 1.10 mg/dL   Calcium 9.5  8.4 - 10.5 mg/dL   GFR calc non Af Amer >90  >90 mL/min   GFR calc Af Amer >90  >90 mL/min   Anion gap 17 (*) 5 - 15  CBC WITH DIFFERENTIAL      Result Value Ref Range   WBC 7.7  4.0 - 10.5 K/uL   RBC 5.20 (*) 3.87 - 5.11 MIL/uL   Hemoglobin 11.8 (*) 12.0 - 15.0 g/dL   HCT 37.7  36.0 - 46.0 %   MCV 72.5 (*) 78.0 - 100.0 fL   MCH 22.7 (*) 26.0 - 34.0 pg   MCHC 31.3  30.0 - 36.0 g/dL   RDW 16.2 (*) 11.5 - 15.5 %   Platelets 328  150 - 400 K/uL   Neutrophils Relative % 78 (*) 43 - 77 %   Neutro Abs 6.0  1.7 - 7.7 K/uL   Lymphocytes Relative 17  12 - 46 %   Lymphs Abs 1.3  0.7 - 4.0 K/uL   Monocytes Relative 4  3 - 12 %   Monocytes Absolute 0.3  0.1 - 1.0 K/uL   Eosinophils Relative 1  0 - 5 %   Eosinophils Absolute 0.1  0.0 - 0.7 K/uL   Basophils Relative 0  0 - 1 %   Basophils Absolute 0.0  0.0 - 0.1 K/uL   Dg Chest Port 1 View 12/05/2013   CLINICAL DATA:  Admission chest radiograph.  EXAM: PORTABLE CHEST - 1 VIEW  COMPARISON:  None.  FINDINGS: The lungs are well-aerated. Pulmonary vascularity is at the upper limits of normal. Mildly increased interstitial markings are thought to be chronic in nature. There is no evidence of pleural  effusion or pneumothorax.  The cardiomediastinal silhouette is borderline normal in size. No acute osseous abnormalities are seen. A moderate hiatal hernia is seen.  IMPRESSION: 1. No acute cardiopulmonary process seen. 2. Moderate hiatal hernia seen.   Electronically Signed   By: Garald Balding M.D.   On: 12/05/2013 02:10    1900:  TTS evaluation pending. Will move to Psych ED. Holding orders written.   Alfonzo Feller, DO 12/28/13 2338

## 2013-12-28 NOTE — ED Notes (Signed)
Bed: [facility] Expected date:  Expected time:  Means of arrival:  Comments: EMS - [facility]/SI
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
## 2013-12-28 NOTE — ED Notes (Signed)
Patient and belongings have been wanded by security. Patient has 2 bags... 1 patient belonging bag and 1 pocketbook. Items are behind desk in triage.
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
## 2013-12-29 ENCOUNTER — Inpatient Hospital Stay (HOSPITAL_COMMUNITY)
Admission: AD | Admit: 2013-12-29 | Discharge: 2014-01-04 | DRG: 885 | Disposition: A | Discharge status: Home / Self Care | Payer: No Typology Code available for payment source | Source: Intra-hospital | Attending: Psychiatry | Admitting: Psychiatry

## 2013-12-29 ENCOUNTER — Encounter (HOSPITAL_COMMUNITY): Payer: Self-pay | Admitting: Behavioral Health

## 2013-12-29 ENCOUNTER — Encounter (HOSPITAL_COMMUNITY): Payer: Self-pay | Admitting: Registered Nurse

## 2013-12-29 DIAGNOSIS — F22 Delusional disorders: Secondary | ICD-10-CM | POA: Diagnosis present

## 2013-12-29 DIAGNOSIS — Z5987 Material hardship due to limited financial resources, not elsewhere classified: Secondary | ICD-10-CM

## 2013-12-29 DIAGNOSIS — R45851 Suicidal ideations: Secondary | ICD-10-CM | POA: Diagnosis not present

## 2013-12-29 DIAGNOSIS — Z598 Other problems related to housing and economic circumstances: Secondary | ICD-10-CM | POA: Diagnosis not present

## 2013-12-29 DIAGNOSIS — F411 Generalized anxiety disorder: Secondary | ICD-10-CM | POA: Diagnosis present

## 2013-12-29 DIAGNOSIS — F329 Major depressive disorder, single episode, unspecified: Secondary | ICD-10-CM | POA: Diagnosis present

## 2013-12-29 DIAGNOSIS — F323 Major depressive disorder, single episode, severe with psychotic features: Secondary | ICD-10-CM

## 2013-12-29 DIAGNOSIS — F333 Major depressive disorder, recurrent, severe with psychotic symptoms: Principal | ICD-10-CM | POA: Diagnosis present

## 2013-12-29 DIAGNOSIS — F17200 Nicotine dependence, unspecified, uncomplicated: Secondary | ICD-10-CM | POA: Diagnosis present

## 2013-12-29 DIAGNOSIS — Z85828 Personal history of other malignant neoplasm of skin: Secondary | ICD-10-CM

## 2013-12-29 DIAGNOSIS — Z5989 Other problems related to housing and economic circumstances: Secondary | ICD-10-CM | POA: Diagnosis not present

## 2013-12-29 DIAGNOSIS — G4700 Insomnia, unspecified: Secondary | ICD-10-CM | POA: Diagnosis present

## 2013-12-29 MED ORDER — CARBAMAZEPINE ER 200 MG PO TB12
200.0000 mg | ORAL_TABLET | Freq: Two times a day (BID) | ORAL | Status: DC
  Administered 2013-12-29 – 2014-01-04 (×12): 200 mg via ORAL
  Filled 2013-12-29 (×4): qty 1
  Filled 2013-12-29: qty 28
  Filled 2013-12-29 (×5): qty 1
  Filled 2013-12-29: qty 28
  Filled 2013-12-29 (×2): qty 1
  Filled 2013-12-29 (×2): qty 28
  Filled 2013-12-29 (×5): qty 1

## 2013-12-29 MED ORDER — HYDROXYZINE HCL 25 MG PO TABS
25.0000 mg | ORAL_TABLET | Freq: Four times a day (QID) | ORAL | Status: DC | PRN
  Administered 2013-12-29 – 2014-01-02 (×4): 25 mg via ORAL
  Filled 2013-12-29: qty 30
  Filled 2013-12-29 (×4): qty 1

## 2013-12-29 MED ORDER — MAGNESIUM HYDROXIDE 400 MG/5ML PO SUSP: 30.0000 mL | Freq: Every day | ORAL | Status: DC | PRN

## 2013-12-29 MED ORDER — CARBAMAZEPINE ER 200 MG PO TB12
200.0000 mg | ORAL_TABLET | Freq: Two times a day (BID) | ORAL | Status: DC
  Administered 2013-12-29: 200 mg via ORAL
  Filled 2013-12-29 (×2): qty 1

## 2013-12-29 MED ORDER — SERTRALINE HCL 100 MG PO TABS
100.0000 mg | ORAL_TABLET | Freq: Every day | ORAL | Status: DC
  Administered 2013-12-30 – 2014-01-04 (×6): 100 mg via ORAL
  Filled 2013-12-29 (×3): qty 1
  Filled 2013-12-29 (×2): qty 14
  Filled 2013-12-29 (×2): qty 1
  Filled 2013-12-29: qty 2
  Filled 2013-12-29 (×2): qty 1

## 2013-12-29 MED ORDER — SERTRALINE HCL 50 MG PO TABS
100.0000 mg | ORAL_TABLET | Freq: Every day | ORAL | Status: DC
  Administered 2013-12-29: 100 mg via ORAL
  Filled 2013-12-29: qty 2

## 2013-12-29 MED ORDER — TRAZODONE HCL 50 MG PO TABS
50.0000 mg | ORAL_TABLET | Freq: Every evening | ORAL | Status: DC | PRN
  Administered 2013-12-29 – 2014-01-01 (×5): 50 mg via ORAL
  Filled 2013-12-29 (×16): qty 1

## 2013-12-29 NOTE — ED Notes (Signed)
Notified [facility] of transportation needed to [facility]. Stated someone would be here between 4-4:15.

## 2013-12-29 NOTE — Progress Notes (Signed)
PHARMACY BRIEF NOTE:   MEDICATION RECONCILIATION - CARBAMAZEPINE ER    Patient's prior to admission medication list includes Carbatrol (carbamazepine ER) 200 mg PO BID, which has not yet been resumed.  According to chart, PMH includes epilepsy.   Recommend: Resume carbamazepine ER 200 mg PO BID unless withholding this medication is medically necessary and appropriate.  Clayburn Pert, PharmD, BCPS Pager: [phone] 12/29/2013  12:00 PM

## 2013-12-29 NOTE — ED Notes (Signed)
[facility] here to transport to [facility]. Belongings bag x2 given to driver. Ambulatory without difficulty. Denies pain. No complaints voiced.

## 2013-12-29 NOTE — ED Notes (Signed)
Informed her that she was going to be admitted to [facility].
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
## 2013-12-29 NOTE — Progress Notes (Signed)
Admission note: Pt skin assessed, belongings searched, v/s taken, documents signed and writer explained to pt the policy here at [facility].  Report given to [place], Therapist, sports. Pt safety maintained.   Pt reports feeling SI at time of admission. Pt verbally contracts for safety. Christa, RN made aware.

## 2013-12-29 NOTE — Progress Notes (Signed)
[facility] CL provided pt with a [employer], highlighting [facility] [place], to help patient establish primary care.

## 2013-12-29 NOTE — ED Notes (Signed)
Called report to Alva at [facility]. Accepted to room 501-1. Will notify [facility] for transportation.

## 2013-12-29 NOTE — Progress Notes (Signed)
Did not attend group 

## 2013-12-29 NOTE — ED Notes (Signed)
Very anxious, offered PRN Ativan but she declined.

## 2013-12-29 NOTE — Consult Note (Signed)
  Patient states that she is depressed.  "I am taking care of my son who is mentally and physically disabled; but I can't do it anymore.  I am getting sick and I can't take care him; I am trapped.  He won't let me call."  Patient states that she is overwhelmed.    Agree with TTS assessment Inpatient treatment recommended Patient accepted to [facility]  501/01.  Start home medications; monitor for safety and stabilization until transferred.   Shuvon B. Rankin FNP-BC  Patient is seen face to face for psychiatric evaluation along with physician extender, case discussed after clinical rounds and formulated appropriate treatment plan. Reviewed the information documented and agree with the treatment plan.  Aundra Espin,JANARDHAHA R. 12/30/2013 9:58 AM

## 2013-12-30 DIAGNOSIS — R45851 Suicidal ideations: Secondary | ICD-10-CM

## 2013-12-30 MED ORDER — RISPERIDONE 3 MG PO TABS
3.0000 mg | ORAL_TABLET | Freq: Every day | ORAL | Status: DC
  Administered 2013-12-30 – 2014-01-03 (×5): 3 mg via ORAL
  Filled 2013-12-30: qty 14
  Filled 2013-12-30 (×4): qty 1
  Filled 2013-12-30: qty 14
  Filled 2013-12-30 (×2): qty 1

## 2013-12-30 MED ORDER — NICOTINE POLACRILEX 2 MG MT GUM
2.0000 mg | CHEWING_GUM | OROMUCOSAL | Status: DC | PRN
  Administered 2013-12-30 (×3): 2 mg via ORAL
  Filled 2013-12-30 (×3): qty 1

## 2013-12-30 NOTE — Clinical Social Work Note (Addendum)
Pt requested CSW to contact [place] with DSS to check on her son; consent received and in the chart.  Pt's son is mentally retarded and pt is the sole caretaker for him.  CSW spoke with Tia [phone]) at this time.  Tia explained that pt's son has mild MR but has been sheltered and taught to stay in and watch TV all day, so this is all her son knows.  Tia explained that DSS plans to take guardianship of pt's son, but pt is unaware of this plan at this time.  Tia states that pt has been showing bizarre behaviors at home (as reported by the neighbor) such as picking up cigarette butts off the ground and smoking them and not being able to answer question clearly, or forgetting what she just said.    Tia states that pt's son is not being placed in a group home yet, as they are just starting the process. Tia states that the neighbor and aunt are checking on him daily, feeding him and making sure he takes his meds.    Regan Lemming, LCSW 12/30/2013  3:15 PM

## 2013-12-30 NOTE — [facility] Group Notes (Signed)
[place] LCSW Group Therapy  12/30/2013 1:15 PM   Type of Therapy:  Group Therapy  Participation Level:  Did Not Attend - pt has not attended groups all day, rests in her room and currently meeting with MD  Regan Lemming, [place] 12/30/2013 2:48 PM

## 2013-12-30 NOTE — [facility] Suicide Risk Assessment (Signed)
   Nursing information obtained from:  Patient Demographic factors:  Divorced or widowed;Caucasian;Low socioeconomic status;Living alone Current Mental Status:  Suicidal ideation indicated by patient;Suicidal ideation indicated by others;Self-harm thoughts Loss Factors:  Loss of significant relationship;Financial problems / change in socioeconomic status Historical Factors:  Family history of suicide Risk Reduction Factors:  Sense of responsibility to family;Employed Total Time spent with patient: 45 minutes  CLINICAL FACTORS:   Depression:   Anhedonia/ psychosis  Psychiatric Specialty Exam: Physical Exam  ROS  Blood pressure 127/78, pulse 98, temperature 97.8 F (36.6 C), temperature source Oral, resp. rate 18, height 5\' 7"  (1.702 m), weight 78.019 kg (172 lb).Body mass index is 26.93 kg/(m^2).  SEE ADMIT NOTE MSE   COGNITIVE FEATURES THAT CONTRIBUTE TO RISK:  Closed-mindedness  / limited insight  SUICIDE RISK:   Moderate:  Frequent suicidal ideation with limited intensity, and duration, some specificity in terms of plans, no associated intent, good self-control, limited dysphoria/symptomatology, some risk factors present, and identifiable protective factors, including available and accessible social support.  PLAN OF CARE:Patient will be admitted to inpatient psychiatric unit for stabilization and safety. Will provide and encourage milieu participation. Provide medication management and maked adjustments as needed.  Will follow daily.    I certify that inpatient services furnished can reasonably be expected to improve the patient's condition.  COBOS, [place] 12/30/2013, 2:07 PM

## 2013-12-30 NOTE — Progress Notes (Signed)
D:  Passive SI-contracts. Pt denies HI/AVH. Pt is pleasant and cooperative. Pt worried that son may have to go to Group home. Pt concerned that she can't take afford her current place to live without Son's additional income. Pt continues to pace the halls.  A: Pt was offered support and encouragement. Pt was given scheduled medications. Pt was encourage to attend groups. Q 15 minute checks were done for safety.   R:. Pt is taking medication. Pt has no complaints.Pt receptive to treatment and safety maintained on unit.

## 2013-12-30 NOTE — Progress Notes (Signed)
Patient ID: Latoya Green, female   DOB: [birth year], 58 y.o.   MRN: [id number]  0900 [facility] 0900  The focus of this group is to educate the patient on the purpose and policies of crisis stabilization and provide a format to answer questions about their admission.  The group details unit policies and expectations of patients while admitted.  Patient did not attend group although she was encouraged.
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
## 2013-12-30 NOTE — BHH Suicide Risk Assessment (Signed)
[facility] Adult Inpatient Family/Significant Other Suicide Prevention Education  Suicide Prevention Education:   Patient Refusal for Family/Significant Other Suicide Prevention Education: The patient has refused to provide written consent for family/significant other to be provided Family/Significant Other Suicide Prevention Education during admission and/or prior to discharge.  Physician notified.  CSW provided suicide prevention information with patient.    The suicide prevention education provided includes the following:  Suicide risk factors  Suicide prevention and interventions  National Suicide Hotline telephone number  [facility] assessment telephone number  [facility] Emergency Assistance [place] and/or Residential Mobile Crisis Unit telephone number   Regan Lemming, [place] 12/30/2013 9:23 AM

## 2013-12-30 NOTE — Progress Notes (Signed)
Patient ID: Latoya Green, female   DOB: 09-13-1955, 58 y.o.   MRN: [id number]  D: Pt. Denies HI and A/V Hallucinations. Patient endorses suicidal ideation but can contract for safety at this time.She rates her depression and anxiety at 9/10 and her hopelessness at 10/10 for the day. Patient reports that she slept poorly, and her energy level is low today. Patient does not report any pain or discomfort at this time. Patient is disorganized when speaking and flight of ideas. Patient reports that, "heat is going to make me sick and have a seizure." When asked if the air conditioning had been cut off in her home patient reported no but there is heat outside. Patient acts bizarre and is cautious with this Probation officer. Patient is isolating at this time.  A: Support and encouragement provided to the patient to go to groups and come to Probation officer with questions or concerns. Patient verbalizes understanding but remains avoidant. Scheduled medications administered to patient per physician's orders.  R: Patient cooperative but isolates. Patient is rarely seen in the milieu and most of the time is seen laying in her bed. Q15 minute checks are maintained for safety.

## 2013-12-31 LAB — LIPID PANEL
CHOLESTEROL: 141 mg/dL (ref 0–200)
HDL: 58 mg/dL (ref >39)
LDL Cholesterol: 53 mg/dL (ref 0–99)
TRIGLYCERIDES: 150 mg/dL — AB (ref <150)
Total CHOL/HDL Ratio: 2.4 RATIO
VLDL: 30 mg/dL (ref 0–40)

## 2013-12-31 LAB — HEMOGLOBIN A1C
Hgb A1c MFr Bld: 6.2 % — ABNORMAL HIGH (ref <5.7)
Mean Plasma Glucose: 131 mg/dL — ABNORMAL HIGH (ref <117)

## 2013-12-31 NOTE — Progress Notes (Signed)
NUTRITION ASSESSMENT  Pt identified as at risk on the Malnutrition Screen Tool  INTERVENTION: 1. Educated patient on the importance of nutrition and encouraged intake of food and beverages. 2. Discussed weight goals. 3. Supplements: none at this time.  NUTRITION DIAGNOSIS: Unintentional weight loss related to sub-optimal intake as evidenced by pt report.   Goal: Pt to meet >/= 90% of their estimated nutrition needs.  Monitor:  PO intake  Assessment:  Patient admitted with psychosis.  Eating well here with good appetite, Made self eat at home but would eat regular meals.  UBW 200 lbs.  Recent weight loss of 28 lbs.  58 y.o. female  Height: Ht Readings from Last 1 Encounters:  12/29/13 5\' 7"  (1.702 m)    Weight: Wt Readings from Last 1 Encounters:  12/29/13 172 lb (78.019 kg)    Weight Hx: Wt Readings from Last 10 Encounters:  12/29/13 172 lb (78.019 kg)  12/04/13 172 lb 7 oz (78.217 kg)    BMI:  Body mass index is 26.93 kg/(m^2). Pt meets criteria for overweight based on current BMI.  Estimated Nutritional Needs: Kcal: 25-30 kcal/kg Protein: > 1 gram protein/kg Fluid: 1 ml/kcal  Diet Order: General Pt is also offered choice of unit snacks mid-morning and mid-afternoon.  Pt is eating as desired.   Lab results and medications reviewed.   Antonieta Iba, RD, LDN Clinical Inpatient Dietitian Pager:  [phone] Weekend and after hours pager:  [phone]

## 2013-12-31 NOTE — Progress Notes (Signed)
Psychoeducational Group Note  Date:  12/31/2013 Time:  2105  Group Topic/Focus:  Wrap-Up Group:   The focus of this group is to help patients review their daily goal of treatment and discuss progress on daily workbooks.  Participation Level: Did Not Attend  Participation Quality:  Not Applicable  Affect:  Not Applicable  Cognitive:  Not Applicable  Insight:  Not Applicable  Engagement in Group: Not Applicable  Additional Comments:  The patient was encouraged to attend group, but she waived her hand and then rolled back over in her bed.   Archie Balboa S 12/31/2013, 9:07 PM

## 2013-12-31 NOTE — Progress Notes (Signed)
[name] is seen OOB UAL on the campus tolerated well  today. She is sad, depressed and has a flat affect. She rates her depression and hopelessness "5/10/6" and states her DC plan is " I'm not sure yet". A She  Is isolative to her room, does not communicate with this staff and delusional thinkging contiues. R Safety is in place poc cont.

## 2013-12-31 NOTE — BHH Group Notes (Signed)
[facility] LCSW Group Therapy  12/31/2013 1:15 PM   Type of Therapy:  Group Therapy  Participation Level:  Did Not Attend - pt sleeping in her room  Regan Lemming, [place] 12/31/2013 2:29 PM
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
## 2013-12-31 NOTE — BHH Group Notes (Signed)
[facility] LCSW Aftercare Discharge Planning Group Note   12/31/2013  8:45 AM  Participation Quality:  Did Not Attend - pt awake in her room but refuses to come to groups  Regan Lemming, LCSW 12/31/2013 9:20 AM

## 2013-12-31 NOTE — Tx Team (Signed)
Interdisciplinary Treatment Plan Update (Adult)  Date: 12/31/2013  Time Reviewed:  9:45 AM  Progress in Treatment: Attending groups: Yes Participating in groups:  Yes Taking medication as prescribed:  Yes Tolerating medication:  Yes Family/Significant othe contact made: No, pt refused Patient understands diagnosis:  Yes Discussing patient identified problems/goals with staff:  Yes Medical problems stabilized or resolved:  Yes Denies suicidal/homicidal ideation: Yes Issues/concerns per patient self-inventory:  Yes Other:  New problem(s) identified: N/A  Discharge Plan or Barriers: Pt will follow up at [facility] for outpatient medication management and therapy.    Reason for Continuation of Hospitalization: Anxiety Depression Medication Stabilization  Comments: N/A  Estimated length of stay: 3-5 days  For review of initial/current patient goals, please see plan of care.  Attendees: Patient:     Family:     Physician:  Dr. Parke Poisson 12/31/2013 9:26 AM   Nursing:  Vira Browns, RN 12/31/2013 9:26 AM   Clinical Social Worker:  Regan Lemming, LCSW 12/31/2013 9:26 AM   Other:   Other:     Other:     Other:     Other:    Other:    Other:    Other:    Other:      Scribe for Treatment Team:   Ane Payment, 12/31/2013 , 9:26 AM

## 2013-12-31 NOTE — Progress Notes (Signed)
D:  Pt passive SI-contracts. Pt denies HI/AVH. Pt is pleasant and cooperative. Pt stated she felt a little better today. Pt was observed on milieu pacing less than yesterday. Pt refused trazodone because she stated it made her legs jumpy last night.   A: Pt was offered support and encouragement. Pt was given scheduled medications. Pt was encourage to attend groups. Q 15 minute checks were done for safety.   R:Pt attends groups and interacts well with peers and staff. Pt is taking medication. Pt has no complaints at this time.Pt receptive to treatment and safety maintained on unit.

## 2014-01-01 DIAGNOSIS — F29 Unspecified psychosis not due to a substance or known physiological condition: Secondary | ICD-10-CM

## 2014-01-01 NOTE — Progress Notes (Signed)
[facility] MD Progress Note  01/01/2014 11:41 AM Latoya Green  MRN:  [id number] Subjective:  " I am feeling much better now. Im not as sleepy and much more alert." She notes improvement in her mood and over well being. She was present during this morning group sessions, and reports active participation. Reports compliance with medication and denies side effects at this time. Currently rates her depression at 5/10, anxiety 4/10"considering what Im facing its not to bad...money problems, fear of being alone once my son leaves." States her 36yo son may move to a group home, she is starting to develop caregiver strain and working. "Money will leave with him so in a way that is scary" Denies SI "not at the moment"/HI/AVH.  Objective:  Patient seen and chart reviewed. SW has made phone contacts to document her son, who is an adult who has a mental/physical disability, is safe. Report is that DSS is now involved. Continues to have little milieu participation, tends to stay in her room and keep to herself. No agitation or disruptive behaviors.She is observed walking up and down the hallway.   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  Respiratory: Negative for cough.   Cardiovascular: Negative for chest pain.  Psychiatric/Behavioral: Positive for depression.       (+) somatic , paranoid delusions    Blood pressure 98/66, pulse 98, temperature 98.5 F (36.9 C), temperature source Oral, resp. rate 16, height 5\' 7"  (1.702 m), weight 78.019 kg (172 lb).Body mass index is 26.93 kg/(m^2).  General Appearance: Fairly Groomed  Engineer, water::  Minimal   Speech:  Normal Rate  Volume:  Decreased  Mood:  Depressed, Hopeless and Worthless-  Affect:  Anxious   Thought Process:  Circumstantial and Linear  Orientation:  NA- fully alert and attentive  Thought Content:  WDL  Suicidal Thoughts:  No- denies any current suicidal ideations, denies homicidal ideations, contracts for safety on the unit  Homicidal Thoughts:  No  Memory:  Negative  Judgement:  Fair  Insight:  Lacking  Psychomotor Activity:  Normal  Concentration:  Fair  Recall:  Good  Fund of Knowledge:Good  Language: Good  Akathisia:  No  Handed:  Right  AIMS (if indicated):     Assets:  Communication Skills Desire for Improvement Resilience  Sleep:  Number of Hours: 5.25   Musculoskeletal: Strength & Muscle Tone: within normal limits Gait & Station: normal Patient leans: N/A  Current Medications: Current Facility-Administered Medications  Medication Dose Route Frequency Provider Last Rate Last Dose  . carbamazepine (TEGRETOL XR) 12 hr tablet 200 mg  200 mg Oral BID Shuvon Rankin, NP   200 mg at 01/01/14 0759  . hydrOXYzine (ATARAX/VISTARIL) tablet 25 mg  25 mg Oral Q6H PRN Laverle Hobby, PA-C   25 mg at 12/31/13 2147  . magnesium hydroxide (MILK OF MAGNESIA) suspension 30 mL  30 mL Oral Daily PRN Shuvon Rankin, NP      . nicotine polacrilex (NICORETTE) gum 2 mg  2 mg Oral PRN Neita Garnet, MD   2 mg at 12/30/13 2227  . risperiDONE (RISPERDAL) tablet 3 mg  3  mg Oral QHS Neita Garnet, MD   3 mg at 12/31/13 2147  . sertraline (ZOLOFT) tablet 100 mg  100 mg Oral Daily Shuvon Rankin, NP   100 mg at 01/01/14 0759  . traZODone (DESYREL) tablet 50 mg  50 mg Oral QHS,MR X 1 Laverle Hobby, PA-C   50 mg at 12/31/13 3151    Lab Results:  Results for orders placed during the hospital encounter of 12/29/13 (from the past 48 hour(s))  LIPID PANEL     Status: Abnormal   Collection Time    12/31/13  6:49 AM      Result Value Ref Range   Cholesterol 141  0 - 200 mg/dL    Triglycerides 150 (*) <150 mg/dL   HDL 58  >39 mg/dL   Total CHOL/HDL Ratio 2.4     VLDL 30  0 - 40 mg/dL   LDL Cholesterol 53  0 - 99 mg/dL   Comment:            Total Cholesterol/HDL:CHD Risk     Coronary Heart Disease Risk Table                         Men   Women      1/2 Average Risk   3.4   3.3      Average Risk       5.0   4.4      2 X Average Risk   9.6   7.1      3 X Average Risk  23.4   11.0                Use the calculated Patient Ratio     above and the CHD Risk Table     to determine the patient's CHD Risk.                ATP III CLASSIFICATION (LDL):      <100     mg/dL   Optimal      100-129  mg/dL   Near or Above                        Optimal      130-159  mg/dL   Borderline      160-189  mg/dL   High      >190     mg/dL   Very High     Performed at [place] A1[facility]     Status: Abnormal   Collection Time    12/31/13  6:49 AM      Result Value Ref Range   Hemoglobin A1C 6.2 (*) <5.7 %   Comment: (NOTE)                                                                               According to the ADA Clinical Practice Recommendations for 2011, when     HbA1c is used as a screening test:      >=6.5%   Diagnostic of Diabetes Mellitus               (if abnormal result  is confirmed)     5.7-6.4%   Increased risk of developing Diabetes Mellitus     References:Diagnosis and Classification of Diabetes Mellitus,Diabetes     EPPI,9518,84(ZYSAY 1):S62-S69 and Standards of Medical Care in             Diabetes - 2011,Diabetes TKZS,0109,32 (Suppl 1):S11-S61.   Mean Plasma Glucose 131 (*) <117 mg/dL   Comment: Performed at [facility]    Physical Findings: AIMS: Facial and Oral Movements Muscles of Facial Expression: None, normal Lips and Perioral Area: None, normal Jaw: None, normal Tongue: None, normal,Extremity Movements Upper (arms, wrists, hands, fingers): None, normal Lower (legs, knees, ankles, toes): None, normal, Trunk  Movements Neck, shoulders, hips: None, normal, Overall Severity Severity of abnormal movements (highest score from questions above): None, normal Incapacitation due to abnormal movements: None, normal Patient's awareness of abnormal movements (rate only patient's report): No Awareness, Dental Status Current problems with teeth and/or dentures?: No Does patient usually wear dentures?: No  CIWA:    COWS:     Assessment:  Patient remains depressed. She tends to isolate and her milieu participation has been limited. She is tolerating medications well thus far. Denies side effects. Please note that patient admits to having a difficult time if her son leaves the home, however she is no longer able to care for him. She is concerned about losing his income.  Of note, HgbA1C is elevated, but has no current clinical symptoms.  Treatment Plan Summary: Daily contact with patient to assess and evaluate symptoms and progress in treatment Medication management See below  Plan: 1. Continue inpatient treatment, milieu, support. She is encouraged to participate in all group sessions.  2. Have reviewed labs with patient and recommended dietary changes , will get a Nutrionist/Dietary consult and patient encouraged to see PCP soon after discharge. 3.Continue Risperidone 3 mgrs QHS 4. Continue Zoloft 100 mgrs Daily 5. Continue Tegretol XR 200 mgrs BID, for history of epilepsy.   Medical Decision Making Problem Points:  Established problem, stable/improving (1), Review of last therapy session (1) and Review of psycho-social stressors (1) Data Points:  Review or order clinical lab tests (1) Review of medication regiment & side effects (2)  I certify that inpatient services furnished can reasonably be expected to improve the patient's condition.   Priscille Loveless S FNP-BC 01/01/2014, 11:41 AM

## 2014-01-01 NOTE — Progress Notes (Signed)
Latoya Green is seen OOB UAL on the 500 hall today. She walks around with a frown on her face. SHe  Is anxious, nervous and paces up and down the halls often.   A She completes her morning assessment  And on it she rates her depression, hopelessness and anxiety "6/6/6" and says she is worried about her " heat sensitivity and she denies  Experiencing SI within the past 24 hrs.   R She takes her medications and attends her groups as planned. Safety is in place  And poc cont.

## 2014-01-01 NOTE — BHH Group Notes (Signed)
[facility] Group Notes:  (Nursing/MHT/Case Management/Adjunct)  Date:  01/01/2014  Time:  11:21 AM  Type of Therapy:  Psychoeducational Skills  Participation Level:  Did Not Attend  [name] 01/01/2014, 11:21 AM

## 2014-01-01 NOTE — BHH Group Notes (Signed)
[facility] Group Notes: (Clinical Social Work)   01/01/2014      Type of Therapy:  Group Therapy   Participation Level:  Did Not Attend    Selmer Dominion, LCSW 01/01/2014, 2:17 PM

## 2014-01-01 NOTE — Progress Notes (Addendum)
Psychoeducational Group Note  Date:  01/25/2012 Time: 1100  Group Topic/Focus:  Identifying Needs:   The focus of this group is to help patients identify their personal needs that have been historically problematic and identify healthy behaviors to address their needs.  Participation Level:  active Participation Quality: good Affect: flat Cognitive:  minimal  Insight:  minimal  Engagement in Group: engaged  Additional Comments:  P Elizandro Laura RN [employer]

## 2014-01-01 NOTE — Progress Notes (Signed)
[facility] Group Notes:  (Nursing/MHT/Case Management/Adjunct)  Date:  01/01/2014  Time:  8:46 PM  Type of Therapy:  Psychoeducational Skills  Participation Level:  Minimal  Participation Quality:  Resistant  Affect:  Flat  Cognitive:  Appropriate  Insight:  Improving  Engagement in Group:  Improving  Modes of Intervention:  Education  Summary of Progress/Problems: The patient had little to share in group this evening. The patient indicated that she felt better today since she was feeling more awake and more alert. As a theme for the day, her support system outside of the hospital will consist of her neighbors.   Archie Balboa S 01/01/2014, 8:46 PM

## 2014-01-02 MED ORDER — ACETAMINOPHEN 325 MG PO TABS
650.0000 mg | ORAL_TABLET | Freq: Four times a day (QID) | ORAL | Status: DC | PRN
  Administered 2014-01-02: 650 mg via ORAL

## 2014-01-02 NOTE — Progress Notes (Signed)
[facility] MD Progress Note  01/02/2014 11:05 AM Latoya Green  MRN:  [id number] Subjective: Pt states she is feeling good. Her son is being taken care of by a relative so this put her at ease knowing he is ok. She feels normal again, "Im not out of sort, not crying, not trying to kill myself, and I want to fix problems." She notes improvement in her mood and over well being. She was present during this morning group sessions, and reports active participation.  Reports compliance with medication and denies side effects at this time. Currently rates her depression at 1/10, anxiety 0/10. Denies SI/HI/AVH.  Objective:  Patient seen and chart reviewed. Continues to have improve in milieu participation, she is observed in the dayroom engaging with peers.  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  Respiratory: Negative for cough.   Cardiovascular: Negative for chest pain.  Psychiatric/Behavioral: Positive for depression.    Blood pressure 104/77, pulse 98, temperature 98.1 F (36.7 C), temperature source Oral, resp. rate 18, height 5\' 7"  (1.702 m), weight 78.019 kg (172 lb).Body mass index is 26.93 kg/(m^2).  General Appearance: Fairly Groomed  Engineer, water::  Minimal  Speech:  Normal Rate  Volume:  Decreased  Mood:  Depressed, Hopeless and Worthless-  Affect:  Anxious   Thought Process:  Circumstantial and Linear  Orientation:  NA- fully alert and attentive  Thought Content:  WDL  Suicidal Thoughts:  No- denies any current suicidal ideations, denies homicidal ideations, contracts for safety on the unit  Homicidal Thoughts:  No  Memory:   Negative  Judgement:  Fair  Insight:  Lacking  Psychomotor Activity:  Normal  Concentration:  Fair  Recall:  Good  Fund of Knowledge:Good  Language: Good  Akathisia:  No  Handed:  Right  AIMS (if indicated):     Assets:  Communication Skills Desire for Improvement Resilience  Sleep:  Number of Hours: 4   Musculoskeletal: Strength & Muscle Tone: within normal limits Gait & Station: normal Patient leans: N/A  Current Medications: Current Facility-Administered Medications  Medication Dose Route Frequency Provider Last Rate Last Dose  . acetaminophen (TYLENOL) tablet 650 mg  650 mg Oral Q6H PRN Dara Hoyer, PA-C   650 mg at 01/02/14 0143  . carbamazepine (TEGRETOL XR) 12 hr tablet 200 mg  200 mg Oral BID Shuvon Rankin, NP   200 mg at 01/02/14 0723  . hydrOXYzine (ATARAX/VISTARIL) tablet 25 mg  25 mg Oral Q6H PRN Laverle Hobby, PA-C   25 mg at 12/31/13 2147  . magnesium hydroxide (MILK OF MAGNESIA) suspension 30 mL  30 mL Oral Daily PRN Shuvon Rankin, NP      . nicotine polacrilex (NICORETTE) gum 2 mg  2 mg Oral PRN Neita Garnet, MD   2 mg at 12/30/13 2227  . risperiDONE (RISPERDAL) tablet 3 mg  3 mg Oral QHS Neita Garnet, MD   3 mg at 01/01/14 2128  . sertraline (ZOLOFT) tablet 100 mg  100 mg Oral Daily Shuvon Rankin, NP   100 mg at 01/02/14 0723  . traZODone (DESYREL) tablet 50 mg  50 mg Oral QHS,MR X  1 Laverle Hobby, PA-C   50 mg at 01/01/14 2128    Lab Results:  No results found for this or any previous visit (from the past 48 hour(s)).  Physical Findings: AIMS: Facial and Oral Movements Muscles of Facial Expression: None, normal Lips and Perioral Area: None, normal Jaw: None, normal Tongue: None, normal,Extremity Movements Upper (arms, wrists, hands, fingers): None, normal Lower (legs, knees, ankles, toes): None, normal, Trunk Movements Neck, shoulders, hips: None, normal, Overall Severity Severity of abnormal movements (highest score from questions above):  None, normal Incapacitation due to abnormal movements: None, normal Patient's awareness of abnormal movements (rate only patient's report): No Awareness, Dental Status Current problems with teeth and/or dentures?: No Does patient usually wear dentures?: No  CIWA:    COWS:     Assessment:  Patient remains depressed, but looks better than yesterday. She has attend milieu group sessions this morning.She is tolerating medications well thus far. Denies side effects.   Treatment Plan Summary: Daily contact with patient to assess and evaluate symptoms and progress in treatment Medication management See below  Plan: 1. Continue inpatient treatment, milieu, support. She is encouraged to participate in all group sessions.  2. Have reviewed labs with patient and recommended dietary changes , will get a Nutrionist/Dietary consult and patient encouraged to see PCP soon after discharge. 3.Continue Risperidone 3 mgrs QHS 4. Continue Zoloft 100 mgrs Daily 5. Continue Tegretol XR 200 mgrs BID, for history of epilepsy.   Medical Decision Making Problem Points:  Established problem, stable/improving (1), Review of last therapy session (1) and Review of psycho-social stressors (1) Data Points:  Review or order clinical lab tests (1) Review of medication regiment & side effects (2)  I certify that inpatient services furnished can reasonably be expected to improve the patient's condition.   Priscille Loveless S FNP-BC 01/02/2014, 11:05 AM

## 2014-01-02 NOTE — Progress Notes (Signed)
D: Pt denies SI/HI/AVH. Pt is pleasant and cooperative. Pt stated she is still working on her situation. Pt happy a family member agreed to pick her up during D/C. Pt does not want to take the Trazodone- complains it gives her restless legs at night.   A: Pt was offered support and encouragement. Pt was given scheduled medications. Pt was encourage to attend groups. Q 15 minute checks were done for safety.   R:Pt attends groups and interacts well with peers and staff. Pt is taking medication.Pt receptive to treatment and safety maintained on unit.

## 2014-01-02 NOTE — Progress Notes (Addendum)
Latoya Green is seen out on the 500 hall today. Status quo. She is pleasant, cooperative. Takes her meds according to schedule.   A She completes her morning self assessment and on it she rates her feelings of depression, hopelessness and anxiety "1/1/1", respectively and  she denies experiencing SI within the past 24 hrs . Pt remains disconnected...meaning she isolates from staff and patients, her thought pattern can be incongruent and her affect is flat and emotionless.   R POC cont and potential DC for pt to return to her home at the beginning of this coming week.

## 2014-01-02 NOTE — Progress Notes (Signed)
Patient ID: Latoya Green, female   DOB: 1956/03/08, 58 y.o.   MRN: [id number] D)  Has been visible on the hall this evening and has been interacting more with peers.  Attended group, afterward came to med window for hs meds, and was talking about her son being at home and being taken care of by her aunt who is 83 and "looks like Haskel Khan", and if there is an issue with his care, she is not aware of it.   States her aunt has a good relationship with her son and she is in good health, and is thankful for help at this time.  States she knows she has to get back on her meds and  Is working on herself.  Has been pleasant, compliant, brighter, still disheveled but improving. A)  Support and encouragement given,  Will continue to monitor for safety with q 15 minute checks, continue POC R)  Safety maintained.

## 2014-01-02 NOTE — Progress Notes (Signed)
Psychoeducational Group Note  Date: 01/02/2014 Time: 1100 Group Topic/Focus:  Making Healthy Choices:   The focus of this group is to help patients identify negative/unhealthy choices they were using prior to admission and identify positive/healthier coping strategies to replace them upon discharge.  Participation Level:  Did Not Attend  PAdditional Comments:    01/02/2014,6:03 PM Laureen Frederic, Trixie Rude

## 2014-01-02 NOTE — Progress Notes (Signed)
Patient ID: Latoya Green, female   DOB: [birth year], 58 y.o.   MRN: [id number]  Patient awake with complaints of restlessness and aches in both legs. Pt states she believes the Trazodone may have caused this. Pt requests Tylenol for aches and states it helps her sleep as well. [place], [place] notified; new orders obtained.

## 2014-01-02 NOTE — BHH Group Notes (Signed)
[place] Group Notes:  (Nursing/MHT/Case Management/Adjunct)  Date:  01/02/2014  Time:  10:55 AM  Type of Therapy:  Psychoeducational Skills  Participation Level:  Active  Participation Quality:  Appropriate  Affect:  Appropriate  Cognitive:  Appropriate  Insight:  Appropriate  Engagement in Group:  Engaged  Modes of Intervention:  Discussion  Summary of Progress/Problems: Pt did attend self inventory group, pt reported that she was negative SI/HI, no AH/VH noted. Pt rated her depression as a 2, and her helplessness/hopelessness as a 2.     Pt reported no issues or concerns.   Benancio Deeds Shanta 01/02/2014, 10:55 AM

## 2014-01-02 NOTE — BHH Group Notes (Signed)
[place] Group Notes:  (Clinical Social Work)  01/02/2014   1:15-2:15PM  Summary of Progress/Problems:  The main focus of today's process group was to   identify the patient's current support system and decide on other supports that can be put in place.  The picture on workbook was used to discuss why additional supports are needed.  An emphasis was placed on using counselor, doctor, therapy groups, 12-step groups, and problem-specific support groups to expand supports.   There was also an extensive discussion about what constitutes a healthy support versus an unhealthy support.  The patient expressed full comprehension of the concepts presented.  One current health support is 1 relative and a few friends who live close to her.  She was very quiet and observant throughout group.  Type of Therapy:  Process Group  Participation Level:  Active  Participation Quality:  Attentive and Sharing  Affect:  Blunted and Depressed  Cognitive:  Appropriate and Oriented  Insight:  Developing/Improving  Engagement in Therapy:  Developing/Improving  Modes of Intervention:  Education,  Support and Processing  Selmer Dominion, LCSW 01/02/2014, 4:00pm

## 2014-01-02 NOTE — Progress Notes (Signed)
Hall Group Notes:  (Nursing/MHT/Case Management/Adjunct)  Date:  01/02/2014  Time:  9:01 PM  Type of Therapy:  Psychoeducational Skills  Participation Level:  Minimal  Participation Quality:  Attentive  Affect:  Flat  Cognitive:  Lacking  Insight:  Lacking  Engagement in Group:  Limited  Modes of Intervention:  Education  Summary of Progress/Problems: The patient verbalized in group that she was "fine" and that she wishes to be discharged but would not offer any details about her day. Her goal for tomorrow is to speak with the doctor.   Julio Storr S 01/02/2014, 9:01 PM

## 2014-01-03 MED ORDER — DOXEPIN HCL 25 MG PO CAPS
25.0000 mg | ORAL_CAPSULE | Freq: Every evening | ORAL | Status: DC | PRN
  Filled 2014-01-03 (×7): qty 1

## 2014-01-03 NOTE — Progress Notes (Signed)
Patient ID: Latoya Green, female   DOB: 04/15/1956, 58 y.o.   MRN: [id number] D: Client reports "medication kicked in" "I've got some aid with my son, more resources" "groups hadn't been that interesting, I talk to people all day at my job" A: Probation officer introduced self to client provided emotional support, reviewed medications and administration schedule. Staff will monitor q11min for safety. R: Client is safe on the unit, attended group.

## 2014-01-03 NOTE — BHH Group Notes (Signed)
[place] LCSW Group Therapy  01/03/2014 1:15 PM   Type of Therapy:  Group Therapy  Participation Level:  Did Not Attend - pt observed in the hallway, waiting to talk to the doctor about discharging  Regan Lemming, [place] 01/03/2014 3:27 PM

## 2014-01-03 NOTE — BHH Group Notes (Signed)
[facility] LCSW Aftercare Discharge Planning Group Note   01/03/2014 8:45 AM  Participation Quality:  Alert, Appropriate and Oriented  Mood/Affect:  Calm  Depression Rating:  1  Anxiety Rating:  1  Thoughts of Suicide:  Pt denies SI/HI  Will you contract for safety?   Yes  Current AVH:  Pt denies  Plan for Discharge/Comments:  Pt attended discharge planning group and actively participated in group.  CSW provided pt with today's workbook.  Pt reports feeling ready to d/c.  Pt states that she is worried about her disabled son and wants to get home to care him.  Pt continues to talk about the heat sensitivity but appears less delusional today, and improved from last week's interactions.  Pt will return home in [place] and has follow up scheduled at [facility] for outpatient medication management and therapy.  No further needs voiced by pt at this time.    Transportation Means: Pt reports access to transportation - relative will pick pt up  Supports: No supports mentioned at this time  Regan Lemming, LCSW 01/03/2014 10:37 AM

## 2014-01-03 NOTE — Progress Notes (Addendum)
Patient ID: Latoya Green, female   DOB: Sep 17, 1955, 58 y.o.   MRN: [id number]  D: Pt. Denies SI/HI and A/V Hallucinations. Patient does not report any pain or discomfort at this time. Patient reports that she is worried about her son and, "I need to get home." Patient is fixated on discharge and getting home to her son. Patient rates her depression, anxiety, and hopelessness at 1/10 for the day. Patient reports that she slept well, her appetite is good, and concentration level is normal today.  A: Support and encouragement provided to the patient to speak to MD and LCSW about desire for discharge today. Patient verbalized understanding. Scheduled medications administered to patient per physician's orders. No s/s of distress noted.  R: Patient is receptive and cooperative with Probation officer. Patient is seen in the milieu, pacing at times. Q15 minute checks are maintained for safety.

## 2014-01-03 NOTE — Progress Notes (Signed)
Patient ID: Latoya Green, female   DOB: Jul 29, 1955, 58 y.o.   MRN: [id number] [facility] MD Progress Note  01/03/2014 5:59 PM Mearl Harewood  MRN:  [id number] Subjective: Pt seen and chart reviewed. Pt is making excellent progress per nursing staff. Pt denies Si, HI, and AVH, contracts for safety. Pt expresses good insight, a good support system, and a job that she has been allowed to return to tomorrow. Pt is requesting discharge in AM and per social work, Engineer, civil (consulting), and this NP, pt is likely stable to do so.   Objective:  Patient seen and chart reviewed. Continues to have improve in milieu participation, she is observed in the dayroom engaging with peers.  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  Respiratory: Negative for cough.   Cardiovascular: Negative for chest pain.  Psychiatric/Behavioral: Positive for depression.    Blood pressure 113/78, pulse 101, temperature 97.6 F (36.4 C), temperature source Oral, resp. rate 20, height 5\' 7"  (1.702 m), weight 78.019 kg (172 lb).Body mass index is 26.93 kg/(m^2).  General Appearance: Fairly Groomed  Engineer, water::  Minimal  Speech:  Normal Rate  Volume:  Decreased  Mood:  Depressed, Hopeless and Worthless-  Affect:  Anxious   Thought Process:  Circumstantial and Linear  Orientation:  NA- fully alert and attentive  Thought Content:  WDL  Suicidal Thoughts:  No- denies any current suicidal ideations, denies homicidal ideations, contracts for safety on the unit  Homicidal Thoughts:  No  Memory:  Negative  Judgement:  Fair  Insight:  Lacking  Psychomotor Activity:  Normal   Concentration:  Fair  Recall:  Good  Fund of Knowledge:Good  Language: Good  Akathisia:  No  Handed:  Right  AIMS (if indicated):     Assets:  Communication Skills Desire for Improvement Resilience  Sleep:  Number of Hours: 6   Musculoskeletal: Strength & Muscle Tone: within normal limits Gait & Station: normal Patient leans: N/A  Current Medications: Current Facility-Administered Medications  Medication Dose Route Frequency Provider Last Rate Last Dose  . acetaminophen (TYLENOL) tablet 650 mg  650 mg Oral Q6H PRN Dara Hoyer, PA-C   650 mg at 01/02/14 0143  . carbamazepine (TEGRETOL XR) 12 hr tablet 200 mg  200 mg Oral BID Shuvon Rankin, NP   200 mg at 01/03/14 1636  . doxepin (SINEQUAN) capsule 25 mg  25 mg Oral QHS,MR X 1 Benjamine Mola, FNP      . hydrOXYzine (ATARAX/VISTARIL) tablet 25 mg  25 mg Oral Q6H PRN Laverle Hobby, PA-C   25 mg at 01/02/14 2140  . magnesium hydroxide (MILK OF MAGNESIA) suspension 30 mL  30 mL Oral Daily PRN Shuvon Rankin, NP      . nicotine polacrilex (NICORETTE) gum 2 mg  2 mg Oral PRN Neita Garnet, MD   2 mg at 12/30/13 2227  . risperiDONE (RISPERDAL) tablet 3 mg  3 mg Oral QHS Neita Garnet, MD   3 mg at 01/02/14 2140  . sertraline (ZOLOFT) tablet 100 mg  100 mg Oral Daily Shuvon Rankin, NP   100 mg at 01/03/14 6433    Lab  Results:  No results found for this or any previous visit (from the past 48 hour(s)).  Physical Findings: AIMS: Facial and Oral Movements Muscles of Facial Expression: None, normal Lips and Perioral Area: None, normal Jaw: None, normal Tongue: None, normal,Extremity Movements Upper (arms, wrists, hands, fingers): None, normal Lower (legs, knees, ankles, toes): None, normal, Trunk Movements Neck, shoulders, hips: None, normal, Overall Severity Severity of abnormal movements (highest score from questions above): None, normal Incapacitation due to abnormal movements: None, normal Patient's awareness of abnormal  movements (rate only patient's report): No Awareness, Dental Status Current problems with teeth and/or dentures?: No Does patient usually wear dentures?: No  CIWA:    COWS:     Assessment:  Patient remains depressed, but looks better than yesterday. She has attend milieu group sessions this morning.She is tolerating medications well thus far. Denies side effects.   Treatment Plan Summary: Daily contact with patient to assess and evaluate symptoms and progress in treatment Medication management See below  Plan: 1. Continue inpatient treatment, milieu, support. She is encouraged to participate in all group sessions.  2. Have reviewed labs with patient and recommended dietary changes , will get a Nutrionist/Dietary consult and patient encouraged to see PCP soon after discharge. 3.Continue Risperidone 3 mgrs QHS 4. Continue Zoloft 100 mgrs Daily 5. Continue Tegretol XR 200 mgrs BID, for history of epilepsy.   Medical Decision Making Problem Points:  Established problem, stable/improving (1), Review of last therapy session (1) and Review of psycho-social stressors (1) Data Points:  Review or order clinical lab tests (1) Review of medication regiment & side effects (2)  I certify that inpatient services furnished can reasonably be expected to improve the patient's condition.   Benjamine Mola FNP-BC 01/03/2014, 5:59 PM

## 2014-01-04 DIAGNOSIS — F333 Major depressive disorder, recurrent, severe with psychotic symptoms: Principal | ICD-10-CM

## 2014-01-04 LAB — GLUCOSE, CAPILLARY: Glucose-Capillary: 98 mg/dL (ref 70–99)

## 2014-01-04 MED ORDER — CARBAMAZEPINE ER 200 MG PO CP12: 200.0000 mg | ORAL_CAPSULE | Freq: Two times a day (BID) | ORAL | Status: DC

## 2014-01-04 MED ORDER — SERTRALINE HCL 100 MG PO TABS: 100.0000 mg | ORAL_TABLET | Freq: Every day | ORAL | Status: DC

## 2014-01-04 MED ORDER — HYDROXYZINE HCL 25 MG PO TABS: 25.0000 mg | ORAL_TABLET | Freq: Four times a day (QID) | ORAL | Status: DC | PRN

## 2014-01-04 MED ORDER — RISPERIDONE 3 MG PO TABS: 3.0000 mg | ORAL_TABLET | Freq: Every day | ORAL | Status: DC

## 2014-01-04 NOTE — Progress Notes (Signed)
Patient ID: Latoya Green, female   DOB: 1955-08-07, 58 y.o.   MRN: [id number] Patient is discharged ambulatory to ride home with her son and her aunt.  She denies SI/HI.  She verbalizes understanding of her discharge meds and followup.  She was given scripts and a 14 day supply of meds by MD.  She says that friends and neighbors in her apt complex are supportive but they work and are not available to provide transportation during the day.  She is worried about having to ride the bus in the heat.  She was appreciative of care given.

## 2014-01-04 NOTE — Progress Notes (Signed)
Pt stated,"I am so excited about going home today and seeing my son." Pt told the nurse her son has been disabled since birth and that she has a very close relationship with him. Pt does contract for safety and denies Si and HI.She said ,"I can not wait to take him to [employer] which is his favorite place to go." Pt appears bright and happy this am.

## 2014-01-04 NOTE — Tx Team (Signed)
Interdisciplinary Treatment Plan Update (Adult)  Date: 01/04/2014  Time Reviewed:  9:45 AM  Progress in Treatment: Attending groups: Yes Participating in groups:  Yes Taking medication as prescribed:  Yes Tolerating medication:  Yes Family/Significant othe contact made: No, pt refused Patient understands diagnosis:  Yes Discussing patient identified problems/goals with staff:  Yes Medical problems stabilized or resolved:  Yes Denies suicidal/homicidal ideation: Yes Issues/concerns per patient self-inventory:  Yes Other:  New problem(s) identified: N/A  Discharge Plan or Barriers: Pt will follow up at [facility] for outpatient medication management and therapy.   Reason for Continuation of Hospitalization: Stable to d/c today  Comments: N/A  Estimated length of stay: D/C today  For review of initial/current patient goals, please see plan of care.  Attendees: Patient:     Family:     Physician:  Dr. Parke Poisson 01/04/2014 9:33 AM   Nursing:   Marilynne Halsted, RN 01/04/2014 9:33 AM   Clinical Social Worker:  Regan Lemming, LCSW 01/04/2014 9:33 AM   Other:  Peri Maris, [place] 01/04/2014 9:33 AM   Other:  Jake Bathe care coordinator 01/04/2014 9:33 AM   Other:  Thurnell Garbe, RN 01/04/2014 9:33 AM   Other:     Other:    Other:    Other:    Other:    Other:    Other:     Scribe for Treatment Team:   Ane Payment, 01/04/2014 9:33 AM

## 2014-01-04 NOTE — BHH Group Notes (Signed)
[place] Group Notes:  orientation  Date:  01/04/2014  Time:  9:34 AM  Type of Therapy:  Nurse Education  Participation Level:  Active  Participation Quality:  Appropriate  Affect:  Appropriate  Cognitive:  Alert  Insight:  Appropriate  Engagement in Group:  Engaged  Modes of Intervention:  Clarification and Discussion  Summary of Progress/Problems:  Delman Kitten 01/04/2014, 9:34 AM

## 2014-01-04 NOTE — Progress Notes (Signed)
[facility] Adult Case Management Discharge Plan :  Will you be returning to the same living situation after discharge: Yes,  returning to own home At discharge, do you have transportation home?:Yes,  relative will pick pt up Do you have the ability to pay for your medications:Yes,  provided pt with samples and prescriptions and referred pt to [facility] for assistance with affording meds  Release of information consent forms completed and in the chart;  Patient's signature needed at discharge.  Patient to Follow up at: Follow-up Information   Follow up with [facility] On 01/06/2014. (Walk in on this date for hospital discharge appointment. Walk in clinic is Monday - Friday 8 am - 3 pm. They will than schedule you for medication management and therapy. )    Contact information:   [address], [place] [address] Phone: [phone] Fax: [phone]      Patient denies SI/HI:   Yes,  denies SI/HI    Safety Planning and Suicide Prevention discussed:  Yes,  discussed with pt.  Pt refused consent to contact family/friend.  See suicide prevention education note.   Ane Payment 01/04/2014, 9:40 AM

## 2014-01-04 NOTE — [facility] Suicide Risk Assessment (Signed)
Demographic Factors:  58 year old divorced woman, lives with her adult disabled son  Total Time spent with patient: 30 minutes  Psychiatric Specialty Exam: Physical Exam  ROS  Blood pressure 141/82, pulse 90, temperature 97.8 F (36.6 C), temperature source Oral, resp. rate 20, height 5\' 7"  (1.702 m), weight 78.019 kg (172 lb).Body mass index is 26.93 kg/(m^2).  General Appearance: improved grooming  Eye Contact::  Good  Speech:  Normal Rate  Volume:  Normal  Mood:  Euthymic  Affect:  Appropriate  Thought Process:  Goal Directed and Linear  Orientation:  Full (Time, Place, and Person)  Thought Content:  improved, denies hallucinations. Regarding recent delusions, she states it is not bothering her now, and states " risperidone has helped me a lot"  Suicidal Thoughts:  No- denies any suicidal or homicidal ideations  Homicidal Thoughts:  No  Memory:  NA  Judgement:  Fair  Insight:  Fair  Psychomotor Activity:  Normal  Concentration:  Good  Recall:  Good  Fund of Knowledge:Good  Language: Good  Akathisia:  Negative  Handed:  Right  AIMS (if indicated):     Assets:  Communication Skills Desire for Improvement Resilience  Sleep:  Number of Hours: 6    Musculoskeletal: Strength & Muscle Tone: within normal limits Gait & Station: normal Patient leans: N/A   Mental Status Per Nursing Assessment::   On Admission:  Suicidal ideation indicated by patient;Suicidal ideation indicated by others;Self-harm thoughts  Current Mental Status by Physician: At this time her mood is improved, her affect appropriate, grooming is improved, she is better related, calm , psychotic symptoms have improved, delusional thoughts have resolved, no SI or HI.   Loss Factors: States she was able to keep her job, which has relieved some stress. Has disabled adult son at home  Historical Factors: Prior suicide attempts ( 1987)  Risk Reduction Factors:   Sense of responsibility to family,  Living with another person, especially a relative, Positive social support and Positive coping skills or problem solving skills  Continued Clinical Symptoms:  Mood and psychotic symptoms much improved at this time  Cognitive Features That Contribute To Risk:  No gross cognitive deficits noted at this time. Insight has improved partially  Suicide Risk:  Mild:  Suicidal ideation of limited frequency, intensity, duration, and specificity.  There are no identifiable plans, no associated intent, mild dysphoria and related symptoms, good self-control (both objective and subjective assessment), few other risk factors, and identifiable protective factors, including available and accessible social support.  Discharge Diagnoses:   AXIS I:  Major Depression, Recurrent severe- with psychotic features AXIS II:  Deferred AXIS III:   Past Medical History  Diagnosis Date  . Epilepsy   . Cancer    AXIS IV:  economic problems and problems related to social environment AXIS V:  51-60 moderate symptoms ( 60 upon discharge)   Plan Of Care/Follow-up recommendations:  Activity:  As tolerated Diet:  low carbohydrated diet Tests:  NA Other:  See below  Is patient on multiple antipsychotic therapies at discharge:  No   Has Patient had three or more failed trials of antipsychotic monotherapy by history:  No  Recommended Plan for Multiple Antipsychotic Therapies: NA  Follow up with [facility] On 01/06/2014. (Walk in on this date for hospital discharge appointment. Walk in clinic is Monday - Friday 8 am - 3 pm. They will than schedule you for medication management and therapy. )  For medical follow up , will follow  up with Dr. Moreen Fowler, her PCP. We discussed the importance of following up soon to manage/discuss slightly elevated glucose /HgBA1C, and I reviewed with patient risk of incipient DM.  With her patient's express consent I helped her call his office and make appt for 7/28 at 3,15 PM with Dr. Moreen Fowler.     Braidan Ricciardi 01/04/2014, 10:58 AM

## 2014-01-07 NOTE — Progress Notes (Signed)
Patient Discharge Instructions:  After Visit Summary (AVS):   Faxed to:  01/07/14 Psychiatric Admission Assessment Note:   Faxed to:  01/07/14 Suicide Risk Assessment - Discharge Assessment:   Faxed to:  12/2413 Faxed/Sent to the Next Level Care provider:  01/07/14 Faxed to [facility] [place], 01/07/2014, 3:51 PM
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
## 2014-02-27 ENCOUNTER — Encounter (HOSPITAL_COMMUNITY): Payer: Self-pay | Admitting: Emergency Medicine

## 2014-02-27 ENCOUNTER — Emergency Department (HOSPITAL_COMMUNITY)
Admission: EM | Admit: 2014-02-27 | Discharge: 2014-02-28 | Disposition: A | Discharge status: Other Acute Inpatient Hospital | Payer: Federal, State, Local not specified - Other | Attending: Emergency Medicine | Admitting: Emergency Medicine

## 2014-02-27 ENCOUNTER — Ambulatory Visit (HOSPITAL_COMMUNITY)
Admission: RE | Admit: 2014-02-27 | Discharge: 2014-02-27 | Disposition: A | Discharge status: Home / Self Care | Payer: Self-pay | Attending: Psychiatry | Admitting: Psychiatry

## 2014-02-27 DIAGNOSIS — IMO0002 Reserved for concepts with insufficient information to code with codable children: Secondary | ICD-10-CM | POA: Insufficient documentation

## 2014-02-27 DIAGNOSIS — G40909 Epilepsy, unspecified, not intractable, without status epilepticus: Secondary | ICD-10-CM | POA: Insufficient documentation

## 2014-02-27 DIAGNOSIS — Z79899 Other long term (current) drug therapy: Secondary | ICD-10-CM | POA: Insufficient documentation

## 2014-02-27 DIAGNOSIS — F323 Major depressive disorder, single episode, severe with psychotic features: Secondary | ICD-10-CM

## 2014-02-27 DIAGNOSIS — F17200 Nicotine dependence, unspecified, uncomplicated: Secondary | ICD-10-CM | POA: Insufficient documentation

## 2014-02-27 DIAGNOSIS — Z880 Allergy status to penicillin: Secondary | ICD-10-CM | POA: Insufficient documentation

## 2014-02-27 DIAGNOSIS — R45851 Suicidal ideations: Secondary | ICD-10-CM

## 2014-02-27 DIAGNOSIS — Z859 Personal history of malignant neoplasm, unspecified: Secondary | ICD-10-CM | POA: Insufficient documentation

## 2014-02-27 DIAGNOSIS — R1110 Vomiting, unspecified: Secondary | ICD-10-CM | POA: Insufficient documentation

## 2014-02-27 DIAGNOSIS — F411 Generalized anxiety disorder: Secondary | ICD-10-CM | POA: Insufficient documentation

## 2014-02-27 LAB — COMPREHENSIVE METABOLIC PANEL
ALK PHOS: 96 U/L (ref 39–117)
ALT: 11 U/L (ref 0–35)
AST: 16 U/L (ref 0–37)
Albumin: 3.9 g/dL (ref 3.5–5.2)
Anion gap: 15 (ref 5–15)
BUN: 7 mg/dL (ref 6–23)
CO2: 22 meq/L (ref 19–32)
Calcium: 9.7 mg/dL (ref 8.4–10.5)
Chloride: 97 mEq/L (ref 96–112)
Creatinine, Ser: 0.6 mg/dL (ref 0.50–1.10)
GLUCOSE: 106 mg/dL — AB (ref 70–99)
Potassium: 4 mEq/L (ref 3.7–5.3)
SODIUM: 134 meq/L — AB (ref 137–147)
Total Bilirubin: <0.2 mg/dL — ABNORMAL LOW (ref 0.3–1.2)
Total Protein: 7.8 g/dL (ref 6.0–8.3)

## 2014-02-27 LAB — RAPID URINE DRUG SCREEN, HOSP PERFORMED
Amphetamines: NOT DETECTED
BENZODIAZEPINES: NOT DETECTED
Barbiturates: NOT DETECTED
Cocaine: NOT DETECTED
Opiates: NOT DETECTED
TETRAHYDROCANNABINOL: NOT DETECTED

## 2014-02-27 LAB — CBC
HCT: 39.7 % (ref 36.0–46.0)
HEMOGLOBIN: 12.6 g/dL (ref 12.0–15.0)
MCH: 23 pg — ABNORMAL LOW (ref 26.0–34.0)
MCHC: 31.7 g/dL (ref 30.0–36.0)
MCV: 72.4 fL — ABNORMAL LOW (ref 78.0–100.0)
PLATELETS: 268 10*3/uL (ref 150–400)
RBC: 5.48 MIL/uL — ABNORMAL HIGH (ref 3.87–5.11)
RDW: 18.2 % — ABNORMAL HIGH (ref 11.5–15.5)
WBC: 7.5 10*3/uL (ref 4.0–10.5)

## 2014-02-27 LAB — SALICYLATE LEVEL: Salicylate Lvl: <2 mg/dL — ABNORMAL LOW (ref 2.8–20.0)

## 2014-02-27 LAB — ETHANOL: Alcohol, Ethyl (B): <11 mg/dL (ref 0–11)

## 2014-02-27 LAB — ACETAMINOPHEN LEVEL

## 2014-02-27 MED ORDER — IBUPROFEN 200 MG PO TABS: 600.0000 mg | ORAL_TABLET | Freq: Three times a day (TID) | ORAL | Status: DC | PRN

## 2014-02-27 MED ORDER — ONDANSETRON HCL 4 MG PO TABS: 4.0000 mg | ORAL_TABLET | Freq: Three times a day (TID) | ORAL | Status: DC | PRN

## 2014-02-27 MED ORDER — HYDROXYZINE HCL 25 MG PO TABS: 25.0000 mg | ORAL_TABLET | Freq: Four times a day (QID) | ORAL | Status: DC | PRN

## 2014-02-27 MED ORDER — SERTRALINE HCL 50 MG PO TABS
100.0000 mg | ORAL_TABLET | Freq: Every day | ORAL | Status: DC
  Administered 2014-02-28: 100 mg via ORAL
  Filled 2014-02-27 (×2): qty 2

## 2014-02-27 MED ORDER — ZOLPIDEM TARTRATE 5 MG PO TABS
5.0000 mg | ORAL_TABLET | Freq: Every evening | ORAL | Status: DC | PRN
  Administered 2014-02-27: 5 mg via ORAL
  Filled 2014-02-27: qty 1

## 2014-02-27 MED ORDER — LORAZEPAM 1 MG PO TABS
1.0000 mg | ORAL_TABLET | Freq: Three times a day (TID) | ORAL | Status: DC | PRN
  Administered 2014-02-27: 1 mg via ORAL
  Filled 2014-02-27: qty 1

## 2014-02-27 MED ORDER — ALUM & MAG HYDROXIDE-SIMETH 200-200-20 MG/5ML PO SUSP: 30.0000 mL | ORAL | Status: DC | PRN

## 2014-02-27 MED ORDER — ACETAMINOPHEN 325 MG PO TABS
650.0000 mg | ORAL_TABLET | ORAL | Status: DC | PRN
Start: 2014-02-27 — End: 2014-02-28

## 2014-02-27 MED ORDER — CARBAMAZEPINE ER 200 MG PO TB12
200.0000 mg | ORAL_TABLET | Freq: Two times a day (BID) | ORAL | Status: DC
  Administered 2014-02-28: 200 mg via ORAL
  Filled 2014-02-27 (×3): qty 1

## 2014-02-27 NOTE — ED Notes (Signed)
Pt was sent here from [facility] for medical clearance, for SI. Pt sts she would kill herself by starving to death.

## 2014-02-27 NOTE — Progress Notes (Signed)
Recommendation has been made for inptx per Reginold Agent, NP, [facility] currently at capacity.  Attempted to seek placement by contacting the following and all are at capacity:  [place]- per Marva Panda- per Gwendolyn Lima- per Arlyn Leak- per Maximiano Coss- per [facility]- per Murvin Natal- per [place]- per Ralene Cork- per Molly Maduro- per [facility]- per [facility]- per Kendrick Fries [facility]- per [facility]- per [facility]- no longer accepting outside referrals [facility]- per Lattie Haw [facility]- per [facility]- could not reach/no answer [facility]- per Dareen Piano- can not reach [facility]- per [place] per [facility] Disposition MHT

## 2014-02-27 NOTE — ED Provider Notes (Signed)
CSN: [id number]     Arrival date & time 02/27/14  1439 History  This chart was scribed for non-physician practitioner, Baron Sane, PA-C working with Dorie Rank, MD by Frederich Balding, ED scribe. This patient was seen in room WTR3/WLPT3 and the patient's care was started at 3:53 PM.   Chief Complaint  Patient presents with  . Suicidal   The history is provided by the patient. No language interpreter was used.   HPI Comments: Latoya Green is a 58 y.o. female who presents to the Emergency Department complaining of suicidal ideations that recently worsened. Pt was sent here from [facility] for medical clearance. Pt states she would kill herself by starving to death. States she attempted suicide several years ago. Pt reports diarrhea earlier today. Denies HI, hallucinations, self injury, chest pain, difficulty breathing. Denies alcohol or recreational drug use.   Past Medical History  Diagnosis Date  . Epilepsy   . Cancer    History reviewed. No pertinent past surgical history. No family history on file. History  Substance Use Topics  . Smoking status: Current Every Day Smoker -- 1.00 packs/day    Types: Cigarettes  . Smokeless tobacco: Not on file  . Alcohol Use: No   OB History   Grav Para Term Preterm Abortions TAB SAB Ect Mult Living                 Review of Systems  Cardiovascular: Negative for chest pain.  Gastrointestinal: Positive for vomiting.  Psychiatric/Behavioral: Positive for suicidal ideas. Negative for hallucinations and self-injury.  All other systems reviewed and are negative.  Allergies  Keflex and Penicillins  Home Medications   Prior to Admission medications   Medication Sig Start Date End Date Taking? Authorizing Provider  carbamazepine (CARBATROL) 200 MG 12 hr capsule Take 1 capsule (200 mg total) by mouth 2 (two) times daily. 01/04/14  Yes Benjamine Mola, FNP  hydrocortisone cream 1 % Apply 1 application topically daily. Face   Yes  Historical Provider, MD  sertraline (ZOLOFT) 100 MG tablet Take 1 tablet (100 mg total) by mouth daily. 01/04/14  Yes Benjamine Mola, FNP  hydrOXYzine (ATARAX/VISTARIL) 25 MG tablet Take 1 tablet (25 mg total) by mouth every 6 (six) hours as needed for anxiety. 01/04/14   Elyse Jarvis Withrow, FNP   BP 121/78  Pulse 95  Temp(Src) 98.8 F (37.1 C) (Oral)  Resp 17  SpO2 98%  Physical Exam  Nursing note and vitals reviewed. Constitutional: She is oriented to person, place, and time. She appears well-developed and well-nourished. No distress.  HENT:  Head: Normocephalic and atraumatic.  Right Ear: External ear normal.  Left Ear: External ear normal.  Nose: Nose normal.  Mouth/Throat: Oropharynx is clear and moist.  Eyes: Conjunctivae are normal.  Neck: Normal range of motion. Neck supple.  Cardiovascular: Normal rate, regular rhythm and normal heart sounds.   Pulmonary/Chest: Effort normal and breath sounds normal. No respiratory distress. She has no wheezes. She has no rales.  Abdominal: Soft. Bowel sounds are normal. There is no tenderness.  Musculoskeletal: Normal range of motion.  Neurological: She is alert and oriented to person, place, and time.  Skin: Skin is warm and dry. She is not diaphoretic.  Psychiatric: Her mood appears anxious. She expresses suicidal ideation. She expresses no homicidal ideation. She expresses suicidal plans.    ED Course  Procedures (including critical care time) Medications  LORazepam (ATIVAN) tablet 1 mg (not administered)  acetaminophen (TYLENOL) tablet 650 mg (  not administered)  ibuprofen (ADVIL,MOTRIN) tablet 600 mg (not administered)  zolpidem (AMBIEN) tablet 5 mg (not administered)  ondansetron (ZOFRAN) tablet 4 mg (not administered)  alum & mag hydroxide-simeth (MAALOX/MYLANTA) 200-200-20 MG/5ML suspension 30 mL (not administered)  carbamazepine (CARBATROL) 12 hr capsule 200 mg (not administered)  hydrOXYzine (ATARAX/VISTARIL) tablet 25 mg (not  administered)  sertraline (ZOLOFT) tablet 100 mg (not administered)    DIAGNOSTIC STUDIES: Oxygen Saturation is 97% on RA, normal by my interpretation.    COORDINATION OF CARE: 3:54 PM-Discussed treatment plan which includes lab work with pt at bedside and pt agreed to plan.   Labs Review Labs Reviewed  CBC - Abnormal; Notable for the following:    RBC 5.48 (*)    MCV 72.4 (*)    MCH 23.0 (*)    RDW 18.2 (*)    All other components within normal limits  COMPREHENSIVE METABOLIC PANEL - Abnormal; Notable for the following:    Sodium 134 (*)    Glucose, Bld 106 (*)    Total Bilirubin <0.2 (*)    All other components within normal limits  SALICYLATE LEVEL - Abnormal; Notable for the following:    Salicylate Lvl <8.3 (*)    All other components within normal limits  ACETAMINOPHEN LEVEL  ETHANOL  URINE RAPID DRUG SCREEN (HOSP PERFORMED)    Imaging Review No results found.   EKG Interpretation None      MDM   Final diagnoses:  Suicidal ideation    Filed Vitals:   02/27/14 1815  BP: 121/78  Pulse: 95  Temp: 98.8 F (37.1 C)  Resp: 17   Afebrile, NAD, non-toxic appearing, AAOx4.  Patient presents to the ER for suicidal ideation with plan to starve herself. No HI, hallucinations, ETOH or RD use, self injury. Current Plan is to have patient be evaluated by TTS for further assessment on whether or not to be placed inpatient.  Patient has been cleared to move to [facility] pending further assessment.     I personally performed the services described in this documentation, which was scribed in my presence. The recorded information has been reviewed and is accurate.  Harlow Mares, PA-C 02/27/14 1825
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
## 2014-02-27 NOTE — ED Notes (Signed)
Pt remains SI, contracts for safety, will continue to monitor.

## 2014-02-27 NOTE — ED Notes (Signed)
Pt made aware of need for urine specimen. Pt states she can not void at this time. 

## 2014-02-27 NOTE — ED Notes (Signed)
Pt pacing hallway, refusing meds.

## 2014-02-28 ENCOUNTER — Encounter (HOSPITAL_COMMUNITY): Payer: Self-pay | Admitting: Psychiatry

## 2014-02-28 ENCOUNTER — Encounter (HOSPITAL_COMMUNITY): Payer: Self-pay | Admitting: *Deleted

## 2014-02-28 ENCOUNTER — Inpatient Hospital Stay (HOSPITAL_COMMUNITY)
Admission: EM | Admit: 2014-02-28 | Discharge: 2014-03-07 | DRG: 885 | Disposition: A | Discharge status: Home / Self Care | Payer: Federal, State, Local not specified - Other | Source: Intra-hospital | Attending: Psychiatry | Admitting: Psychiatry

## 2014-02-28 DIAGNOSIS — Z598 Other problems related to housing and economic circumstances: Secondary | ICD-10-CM | POA: Diagnosis not present

## 2014-02-28 DIAGNOSIS — F259 Schizoaffective disorder, unspecified: Secondary | ICD-10-CM | POA: Diagnosis present

## 2014-02-28 DIAGNOSIS — Z9119 Patient's noncompliance with other medical treatment and regimen: Secondary | ICD-10-CM | POA: Diagnosis not present

## 2014-02-28 DIAGNOSIS — Z91199 Patient's noncompliance with other medical treatment and regimen due to unspecified reason: Secondary | ICD-10-CM

## 2014-02-28 DIAGNOSIS — F17200 Nicotine dependence, unspecified, uncomplicated: Secondary | ICD-10-CM | POA: Diagnosis present

## 2014-02-28 DIAGNOSIS — Z5989 Other problems related to housing and economic circumstances: Secondary | ICD-10-CM | POA: Diagnosis not present

## 2014-02-28 DIAGNOSIS — F411 Generalized anxiety disorder: Secondary | ICD-10-CM | POA: Diagnosis present

## 2014-02-28 DIAGNOSIS — Z85828 Personal history of other malignant neoplasm of skin: Secondary | ICD-10-CM

## 2014-02-28 DIAGNOSIS — R45851 Suicidal ideations: Secondary | ICD-10-CM | POA: Diagnosis not present

## 2014-02-28 DIAGNOSIS — F329 Major depressive disorder, single episode, unspecified: Secondary | ICD-10-CM | POA: Diagnosis present

## 2014-02-28 DIAGNOSIS — Z5987 Material hardship due to limited financial resources, not elsewhere classified: Secondary | ICD-10-CM

## 2014-02-28 DIAGNOSIS — F333 Major depressive disorder, recurrent, severe with psychotic symptoms: Secondary | ICD-10-CM

## 2014-02-28 DIAGNOSIS — G40309 Generalized idiopathic epilepsy and epileptic syndromes, not intractable, without status epilepticus: Secondary | ICD-10-CM | POA: Diagnosis present

## 2014-02-28 DIAGNOSIS — F323 Major depressive disorder, single episode, severe with psychotic features: Secondary | ICD-10-CM | POA: Diagnosis present

## 2014-02-28 MED ORDER — MAGNESIUM HYDROXIDE 400 MG/5ML PO SUSP
30.0000 mL | Freq: Every day | ORAL | Status: DC | PRN
Start: 2014-02-28 — End: 2014-03-07

## 2014-02-28 MED ORDER — ACETAMINOPHEN 325 MG PO TABS: 650.0000 mg | ORAL_TABLET | Freq: Four times a day (QID) | ORAL | Status: DC | PRN

## 2014-02-28 MED ORDER — ALUM & MAG HYDROXIDE-SIMETH 200-200-20 MG/5ML PO SUSP: 30.0000 mL | ORAL | Status: DC | PRN

## 2014-02-28 MED ORDER — RISPERIDONE 1 MG PO TABS: 1.0000 mg | ORAL_TABLET | Freq: Two times a day (BID) | ORAL | Status: DC

## 2014-02-28 MED ORDER — HYDROXYZINE HCL 25 MG PO TABS
25.0000 mg | ORAL_TABLET | Freq: Four times a day (QID) | ORAL | Status: DC | PRN
  Administered 2014-03-04: 25 mg via ORAL
  Filled 2014-02-28: qty 1
  Filled 2014-02-28: qty 30
  Filled 2014-02-28: qty 1

## 2014-02-28 MED ORDER — HYDROCORTISONE 1 % EX CREA
TOPICAL_CREAM | Freq: Two times a day (BID) | CUTANEOUS | Status: DC
  Administered 2014-03-01 – 2014-03-05 (×6): via TOPICAL
  Filled 2014-02-28: qty 28

## 2014-02-28 MED ORDER — SERTRALINE HCL 100 MG PO TABS
100.0000 mg | ORAL_TABLET | Freq: Every day | ORAL | Status: DC
  Administered 2014-03-01 – 2014-03-02 (×2): 100 mg via ORAL
  Filled 2014-02-28 (×4): qty 1

## 2014-02-28 MED ORDER — RISPERIDONE 1 MG PO TABS
1.0000 mg | ORAL_TABLET | Freq: Two times a day (BID) | ORAL | Status: DC
  Filled 2014-02-28 (×6): qty 1

## 2014-02-28 MED ORDER — CARBAMAZEPINE ER 200 MG PO TB12
200.0000 mg | ORAL_TABLET | Freq: Two times a day (BID) | ORAL | Status: DC
  Administered 2014-03-01 – 2014-03-07 (×13): 200 mg via ORAL
  Filled 2014-02-28 (×4): qty 1
  Filled 2014-02-28: qty 28
  Filled 2014-02-28 (×8): qty 1
  Filled 2014-02-28: qty 28
  Filled 2014-02-28 (×5): qty 1

## 2014-02-28 NOTE — Progress Notes (Signed)
Per Otila Kluver, [facility] patient is accepted to [facility] bed 303-1.  Chesley Noon, MSW, [place], 02/28/2014 Evening Clinical Social Worker [phone]

## 2014-02-28 NOTE — Consult Note (Signed)
[place] Psychiatry Consult   Reason for Consult:  Suicidal ideations with a plan Referring Physician:  EDP  Latoya Green is an 58 y.o. female. Total Time spent with patient: 20 minutes  Assessment: AXIS I:  Major depression with psychotic features AXIS II:  Deferred AXIS III:   Past Medical History  Diagnosis Date  . Epilepsy   . Cancer    AXIS IV:  other psychosocial or environmental problems, problems related to social environment and problems with primary support group AXIS V:  21-30 behavior considerably influenced by delusions or hallucinations OR serious impairment in judgment, communication OR inability to function in almost all areas  Plan:  Recommend psychiatric Inpatient admission when medically cleared.Dr. Darleene Green assessed the patient and concurs with the plan.  Subjective:   Latoya Green is a 58 y.o. female patient admitted with depression and suicidal ideations.  HPI:  The patient stated she is suicidal with a plan to slit her wrists or starve herself.  "My body has stopped working."  She stopped taking her antipsychotic medications because she could not swallow them but able to swallow the ones in the ED. Denies alcohol/drug use and homicidal ideations.  Her 52 yo son is handicap and she was caring for him until recently.  She could no longer manage his care and had to send him to a facility. HPI Elements:   Location:  generalized. Quality:  acute. Severity:  severe. Timing:  constant. Duration:  past week or so. Context:  stressors, not taking her medications.  Past Psychiatric History: Past Medical History  Diagnosis Date  . Epilepsy   . Cancer     reports that she has been smoking Cigarettes.  She has been smoking about 1.00 pack per day. She does not have any smokeless tobacco history on file. She reports that she does not drink alcohol or use illicit drugs. History reviewed. No pertinent family history.         Allergies:   Allergies   Allergen Reactions  . Keflex [Cephalexin] Swelling    Pain in her throught.  . Penicillins Other (See Comments)    Childhood allergy    ACT Assessment Complete:  Yes:    Educational Status    Risk to Self: Risk to self with the past 6 months Is patient at risk for suicide?: Yes Substance abuse history and/or treatment for substance abuse?: No  Risk to Others:    Abuse:    Prior Inpatient Therapy:    Prior Outpatient Therapy:    Additional Information:                    Objective: Blood pressure 116/75, pulse 96, temperature 98.1 F (36.7 C), temperature source Oral, resp. rate 20, SpO2 98.00%.There is no weight on file to calculate BMI. Results for orders placed during the hospital encounter of 02/27/14 (from the past 72 hour(s))  ACETAMINOPHEN LEVEL     Status: None   Collection Time    02/27/14  3:34 PM      Result Value Ref Range   Acetaminophen (Tylenol), Serum <15.0  10 - 30 ug/mL   Comment:            THERAPEUTIC CONCENTRATIONS VARY     SIGNIFICANTLY. A RANGE OF 10-30     ug/mL MAY BE AN EFFECTIVE     CONCENTRATION FOR MANY PATIENTS.     HOWEVER, SOME ARE BEST TREATED     AT CONCENTRATIONS OUTSIDE THIS  RANGE.     ACETAMINOPHEN CONCENTRATIONS     >150 ug/mL AT 4 HOURS AFTER     INGESTION AND >50 ug/mL AT 12     HOURS AFTER INGESTION ARE     OFTEN ASSOCIATED WITH TOXIC     REACTIONS.  CBC     Status: Abnormal   Collection Time    02/27/14  3:34 PM      Result Value Ref Range   WBC 7.5  4.0 - 10.5 K/uL   RBC 5.48 (*) 3.87 - 5.11 MIL/uL   Hemoglobin 12.6  12.0 - 15.0 g/dL   HCT 39.7  36.0 - 46.0 %   MCV 72.4 (*) 78.0 - 100.0 fL   MCH 23.0 (*) 26.0 - 34.0 pg   MCHC 31.7  30.0 - 36.0 g/dL   RDW 18.2 (*) 11.5 - 15.5 %   Platelets 268  150 - 400 K/uL   Comment: REPEATED TO VERIFY  COMPREHENSIVE METABOLIC PANEL     Status: Abnormal   Collection Time    02/27/14  3:34 PM      Result Value Ref Range   Sodium 134 (*) 137 - 147 mEq/L    Potassium 4.0  3.7 - 5.3 mEq/L   Chloride 97  96 - 112 mEq/L   CO2 22  19 - 32 mEq/L   Glucose, Bld 106 (*) 70 - 99 mg/dL   BUN 7  6 - 23 mg/dL   Creatinine, Ser 0.60  0.50 - 1.10 mg/dL   Calcium 9.7  8.4 - 10.5 mg/dL   Total Protein 7.8  6.0 - 8.3 g/dL   Albumin 3.9  3.5 - 5.2 g/dL   AST 16  0 - 37 U/L   ALT 11  0 - 35 U/L   Alkaline Phosphatase 96  39 - 117 U/L   Total Bilirubin <0.2 (*) 0.3 - 1.2 mg/dL   GFR calc non Af Amer >90  >90 mL/min   GFR calc Af Amer >90  >90 mL/min   Comment: (NOTE)     The eGFR has been calculated using the CKD EPI equation.     This calculation has not been validated in all clinical situations.     eGFR's persistently <90 mL/min signify possible Chronic Kidney     Disease.   Anion gap 15  5 - 15  ETHANOL     Status: None   Collection Time    02/27/14  3:34 PM      Result Value Ref Range   Alcohol, Ethyl (B) <11  0 - 11 mg/dL   Comment:            LOWEST DETECTABLE LIMIT FOR     SERUM ALCOHOL IS 11 mg/dL     FOR MEDICAL PURPOSES ONLY  SALICYLATE LEVEL     Status: Abnormal   Collection Time    02/27/14  3:34 PM      Result Value Ref Range   Salicylate Lvl <0.9 (*) 2.8 - 20.0 mg/dL  URINE RAPID DRUG SCREEN (HOSP PERFORMED)     Status: None   Collection Time    02/27/14  8:53 PM      Result Value Ref Range   Opiates NONE DETECTED  NONE DETECTED   Cocaine NONE DETECTED  NONE DETECTED   Benzodiazepines NONE DETECTED  NONE DETECTED   Amphetamines NONE DETECTED  NONE DETECTED   Tetrahydrocannabinol NONE DETECTED  NONE DETECTED   Barbiturates NONE DETECTED  NONE DETECTED  Comment:            DRUG SCREEN FOR MEDICAL PURPOSES     ONLY.  IF CONFIRMATION IS NEEDED     FOR ANY PURPOSE, NOTIFY LAB     WITHIN 5 DAYS.                LOWEST DETECTABLE LIMITS     FOR URINE DRUG SCREEN     Drug Class       Cutoff (ng/mL)     Amphetamine      1000     Barbiturate      200     Benzodiazepine   342     Tricyclics       876     Opiates          300      Cocaine          300     THC              50   Labs are reviewed and are pertinent for no medical issues noted.  Current Facility-Administered Medications  Medication Dose Route Frequency Provider Last Rate Last Dose  . acetaminophen (TYLENOL) tablet 650 mg  650 mg Oral Q4H PRN Jennifer L Piepenbrink, PA-C      . alum & mag hydroxide-simeth (MAALOX/MYLANTA) 200-200-20 MG/5ML suspension 30 mL  30 mL Oral PRN Jennifer L Piepenbrink, PA-C      . carbamazepine (TEGRETOL XR) 12 hr tablet 200 mg  200 mg Oral BID Jennifer L Piepenbrink, PA-C   200 mg at 02/28/14 0943  . hydrOXYzine (ATARAX/VISTARIL) tablet 25 mg  25 mg Oral Q6H PRN Jennifer L Piepenbrink, PA-C      . ibuprofen (ADVIL,MOTRIN) tablet 600 mg  600 mg Oral Q8H PRN Jennifer L Piepenbrink, PA-C      . LORazepam (ATIVAN) tablet 1 mg  1 mg Oral Q8H PRN Stephani Police Piepenbrink, PA-C   1 mg at 02/27/14 2149  . ondansetron (ZOFRAN) tablet 4 mg  4 mg Oral Q8H PRN Jennifer L Piepenbrink, PA-C      . sertraline (ZOLOFT) tablet 100 mg  100 mg Oral Daily Jennifer L Piepenbrink, PA-C   100 mg at 02/28/14 0943  . zolpidem (AMBIEN) tablet 5 mg  5 mg Oral QHS PRN Stephani Police Piepenbrink, PA-C   5 mg at 02/27/14 2149   Current Outpatient Prescriptions  Medication Sig Dispense Refill  . carbamazepine (CARBATROL) 200 MG 12 hr capsule Take 1 capsule (200 mg total) by mouth 2 (two) times daily.  60 capsule  0  . hydrocortisone cream 1 % Apply 1 application topically daily. Face      . sertraline (ZOLOFT) 100 MG tablet Take 1 tablet (100 mg total) by mouth daily.  30 tablet  0  . hydrOXYzine (ATARAX/VISTARIL) 25 MG tablet Take 1 tablet (25 mg total) by mouth every 6 (six) hours as needed for anxiety.  14 tablet  0    Psychiatric Specialty Exam:     Blood pressure 116/75, pulse 96, temperature 98.1 F (36.7 C), temperature source Oral, resp. rate 20, SpO2 98.00%.There is no weight on file to calculate BMI.  General Appearance: Disheveled  Eye  Sport and exercise psychologist::  Fair  Speech:  Normal Rate  Volume:  Normal  Mood:  Depressed  Affect:  Congruent  Thought Process:  Tangential  Orientation:  Full (Time, Place, and Person)  Thought Content:  Delusions and Hallucinations: Auditory  Suicidal Thoughts:  Yes.  with  intent/plan  Homicidal Thoughts:  No  Memory:  Immediate;   Fair Recent;   Fair Remote;   Fair  Judgement:  Poor  Insight:  Fair  Psychomotor Activity:  Decreased  Concentration:  Fair  Recall:  [employer] of [place]: Fair  Akathisia:  No  Handed:  Right  AIMS (if indicated):     Assets:  Leisure Time Physical Health Resilience Social Support  Sleep:      Musculoskeletal: Strength & Muscle Tone: within normal limits Gait & Station: normal Patient leans: N/A  Treatment Plan Summary: Daily contact with patient to assess and evaluate symptoms and progress in treatment Medication management; admit to inpatient psychiatry for stabilization  Waylan Boga, PMH-NP 02/28/2014 5:52 PM

## 2014-02-28 NOTE — ED Notes (Signed)
Patient has been in her room most of the shift.  Has been quiet and somewhat withdrawn.  Admits to being very depressed and states she does not feel she would be safe if she were to be discharged today.  She is currently awaiting a bed at [facility].  She has been quiet and cooperative this shift.

## 2014-02-28 NOTE — ED Notes (Signed)
Report called to Avelino Leeds, RN at [facility].

## 2014-02-28 NOTE — Progress Notes (Signed)
[place],   Provided pt with a [employer], highlighting [facility], to help patient establish primary care.

## 2014-03-01 MED ORDER — ARIPIPRAZOLE 10 MG PO TABS
10.0000 mg | ORAL_TABLET | Freq: Every day | ORAL | Status: DC
  Administered 2014-03-01: 10 mg via ORAL
  Filled 2014-03-01 (×3): qty 1

## 2014-03-01 NOTE — [facility] Suicide Risk Assessment (Signed)
[place] INPATIENT:  Family/Significant Other Suicide Prevention Education  Suicide Prevention Education:  Patient Refusal for Family/Significant Other Suicide Prevention Education: The patient Latoya Green has refused to provide written consent for family/significant other to be provided Family/Significant Other Suicide Prevention Education during admission and/or prior to discharge.  Physician notified.  Concha Pyo 03/01/2014, 3:08 PM

## 2014-03-01 NOTE — Clinical Social Work Note (Signed)
CSW met with patient to complete PSA.  Patient advised that she will need to go into Hospice as she has an illness and does not want to die from having a suicide.  She stated her son is going into a group home and suicide is her only option.  Patient declined to allow collateral contact.
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
## 2014-03-01 NOTE — Progress Notes (Signed)
The focus of this group is to educate the patient on the purpose and policies of crisis stabilization and provide a format to answer questions about their admission.  The group details unit policies and expectations of patients while admitted.  Patient did not attend 0900 nurse education orientation group this morning.  Patient stayed in her room. 

## 2014-03-01 NOTE — Tx Team (Signed)
Initial Interdisciplinary Treatment Plan   PATIENT STRESSORS: Financial difficulties Health problems Loss of special needs son, who is to be placed in group home   PROBLEM LIST: Problem List/Patient Goals Date to be addressed Date deferred Reason deferred Estimated date of resolution  Suicidal Ideations 02-28-14     Delusions about "chemical roots" 02-28-14     Depressed 02-28-14     Skin? Dermatitis/rash 02-28-14                                    DISCHARGE CRITERIA:  Improved stabilization in mood, thinking, and/or behavior Need for constant or close observation no longer present Reduction of life-threatening or endangering symptoms to within safe limits Verbal commitment to aftercare and medication compliance  PRELIMINARY DISCHARGE PLAN: Attend aftercare/continuing care group Outpatient therapy Participate in family therapy Return to previous living arrangement Return to previous work or school arrangements  PATIENT/FAMIILY INVOLVEMENT: This treatment plan has been presented to and reviewed with the patient, Latoya Green, and/or family member.  The patient and family have been given the opportunity to ask questions and make suggestions.  [name] 03/01/2014, 12:23 AM
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
## 2014-03-01 NOTE — Progress Notes (Signed)
Adult Psychoeducational Group Note  Date:  03/01/2014 Time:  9:07 PM  Group Topic/Focus:  Wrap-Up Group:   The focus of this group is to help patients review their daily goal of treatment and discuss progress on daily workbooks.  Participation Level:  Did Not Attend  Participation Quality:  Appropriate  Affect:  Appropriate  Cognitive:  Confused  Insight: Lacking and None  Engagement in Group:  None  Modes of Intervention:  None  Additional Comments:  Pt walked the hall and did not go to group   Kvion Shapley R 03/01/2014, 9:07 PM

## 2014-03-01 NOTE — BHH Group Notes (Signed)
[facility] LCSW Group Therapy  Feelings Around Diagnosis 1:15 - 2:30   03/01/2014   2:45 PM   Type of Therapy:  Group Therapy  Participation Level:  Did not attend group.  Patient stated she was too sick.   Concha Pyo 03/01/2014  2:45 PM

## 2014-03-01 NOTE — Progress Notes (Addendum)
Patient ID: Latoya Green, female   DOB: 1955/07/06, 58 y.o.   MRN: [id number] Client is a 58 yo female admitted with suicidal ideations, plan to starve self to death. Client complains that "chemical root stuff setting in, getting to sick to work" Client reports stressor as special needs son being removed from the home going to a group home. "when he goes the money goes"  Client was recently at [facility] in 7/15 with similair complaints. Client works for [employer]. Client contracts for safety, medical history includes seizures and cancer(unknown location), noticeable skin disorder problems ?dermatitis. Client irritable and fidgety during assessment, making tangential remarks. Client denies SHI. Client goal "to feel physical better"  Staff will monitor q47min for safety. Client is safe on the unit.

## 2014-03-01 NOTE — Progress Notes (Signed)
D:  Patient's self inventory sheet, patient has poor sleep, no sleep medication given.  Good appetite, low energy level, poor concentration.  Rated depression 9, hopeless 10, anxiety 3.  Has experienced diarrhea in past 24 hours.  Feels SI at times, contracts for safety.  Physical problems, stomach pain, worst pain #3.  Goal is to work on health today.  Thank you to staff.  No discharge plans. A:  Medications administered per MD orders.  Emotional support and encouragement. R:  Denied HI.  Denied A/V hallucinations.  Safety maintained with 15 minute checks.
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
## 2014-03-01 NOTE — Tx Team (Signed)
Interdisciplinary Treatment Plan Update   Date Reviewed:  03/01/2014  Time Reviewed:  8:23 AM  Progress in Treatment:   Attending groups: Yes Participating in groups: Yes Taking medication as prescribed: Yes  Tolerating medication: Yes Family/Significant other contact made:  No, but will ask patient for consent for collateral contact Patient understands diagnosis: Yes  Discussing patient identified problems/goals with staff: Yes Medical problems stabilized or resolved: Yes Denies suicidal/homicidal ideation: Yes Patient has not harmed self or others: Yes  For review of initial/current patient goals, please see plan of care.  Estimated Length of Stay:  3-5 days  Reasons for Continued Hospitalization:  Anxiety Depression Medication stabilization Suicidal ideation  New Problems/Goals identified:    Discharge Plan or Barriers:   Home with outpatient follow up at [facility]  Additional Comments:  Latoya Green is a 58 y.o. female who presents to the Emergency Department complaining of suicidal ideations that recently worsened. Pt was sent here from [facility] for medical clearance. Pt states she would kill herself by starving to death. States she attempted suicide several years ago. Pt reports diarrhea earlier today. Denies HI, hallucinations, self injury, chest pain, difficulty breathing. Denies alcohol or recreational drug use.   Patient and CSW reviewed patient's identified goals and treatment plan.  Patient verbalized understanding and agreed to treatment plan.   Attendees:  Patient:  03/01/2014 8:23 AM   Signature:  Gabriel Earing, MD 03/01/2014 8:23 AM  Signature: Carlton Adam 03/01/2014 8:23 AM  Signature:  03/01/2014 8:23 AM  Signature:Beverly Danelle Earthly, RN 03/01/2014 8:23 AM  Signature: 03/01/2014 8:23 AM  Signature:  Joette Catching, LCSW 03/01/2014 8:23 AM  Signature:  Erasmo Downer Drinkard, LCSW-A 03/01/2014 8:23 AM  Signature:  Lucinda Dell, Care Coordinator [facility] 03/01/2014 8:23  AM  Signature:   03/01/2014 8:23 AM  Signature: Marilynne Halsted, RN 03/01/2014  8:23 AM  Signature:   Lars Pinks, RN [facility] 03/01/2014  8:23 AM  Signature:  Taylor Worker LCSW 03/01/2014  8:23 AM    Scribe for Treatment Team:   Joette Catching,  03/01/2014 8:23 AM \

## 2014-03-01 NOTE — Consult Note (Signed)
  Patient seen, evaluated and I agree with notes by Nurse Practitioner. Corena Pilgrim, MD

## 2014-03-02 LAB — CARBAMAZEPINE LEVEL, TOTAL: CARBAMAZEPINE LVL: 3.4 ug/mL — AB (ref 4.0–12.0)

## 2014-03-02 MED ORDER — ARIPIPRAZOLE 15 MG PO TABS
15.0000 mg | ORAL_TABLET | Freq: Every day | ORAL | Status: DC
  Administered 2014-03-02 – 2014-03-06 (×5): 15 mg via ORAL
  Filled 2014-03-02: qty 1
  Filled 2014-03-02: qty 14
  Filled 2014-03-02 (×5): qty 1

## 2014-03-02 MED ORDER — SERTRALINE HCL 50 MG PO TABS
150.0000 mg | ORAL_TABLET | Freq: Every day | ORAL | Status: DC
  Administered 2014-03-03 – 2014-03-07 (×5): 150 mg via ORAL
  Filled 2014-03-02 (×7): qty 1

## 2014-03-02 NOTE — Progress Notes (Signed)
D   Pt is depressed and anxious   She reports feeling 10 times better than when she first came in   Her thinking is clearer and she is not making any delusional statements   She expresses concern for the medication she takes and said she is getting a new medication and hopes it doesn't have a bad reaction like her former medication   She said the rash on her face and body is getting better since she quit taking the other medication   She contracts for safety  She did not attend group but has been visible on the milieu A    Verbal support given   Medications administered and effectiveness monitored   Q 15 min checks  Encouraged group attendance and socialization R   Pt receptive and is presently safe

## 2014-03-02 NOTE — BHH Group Notes (Signed)
[place] LCSW Group Therapy  Emotional Regulation 1:15 - 2: 30 PM        03/02/2014     Type of Therapy:  Group Therapy  Participation Level:  Appropriate  Participation Quality:  Minimal  Affect:  Appropriate  Cognitive:  Attentive Appropriate  Insight:  Developing/Improving   Engagement in Therapy:  Developing/Improving  Modes of Intervention:  Discussion Exploration Problem-Solving Supportive  Summary of Progress/Problems:  Group topic was emotional regulations.  Patient did not participate in group discussion but was able to remain in group throughout the session.  She was commended for her effort.    Concha Pyo 03/02/2014

## 2014-03-02 NOTE — ED Provider Notes (Signed)
Medical screening examination/treatment/procedure(s) were performed by non-physician practitioner and as supervising physician I was immediately available for consultation/collaboration.   EKG Interpretation None        Yousif Edelson, MD 03/02/14 1505 

## 2014-03-02 NOTE — BHH Group Notes (Signed)
Adult Psychoeducational Group Note  Date:  03/02/2014 Time:  9:57 PM  Group Topic/Focus:  Wrap-Up Group:   The focus of this group is to help patients review their daily goal of treatment and discuss progress on daily workbooks.  Participation Level:  Active  Participation Quality:  Attentive  Affect:  Appropriate  Cognitive:  Appropriate  Insight: Good  Engagement in Group:  Engaged  Modes of Intervention:  Discussion  Additional Comments:  Julieth stated that her better.  She stated her mood was better, she has a better outlook and her medications are kicking in.  She also stated she was still working on discharge plans and her goal was to talk with the doctor.  She expressed that her talk with the doctor went well and as a coping skill she is talking to others more than usual.  [name] A 03/02/2014, 9:57 PM
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
## 2014-03-02 NOTE — BHH Group Notes (Signed)
[facility] LCSW Aftercare Discharge Planning Group Note   03/02/2014 10:43 AM    Participation Quality: Patient did not attend group. She advised of having problems with diarrhea.    Concha Pyo 03/02/2014  10:44 AM

## 2014-03-02 NOTE — Progress Notes (Signed)
Patient in room crying. Writer asked patient what was going on. Patient stated that she was in the process of finding her mentally disabled son a long term treatment facility because she was unable to provide care for him due to her age. Writer provided emotional support and 1:1 conversation. Patient stated that she was feeling better but was struggling with her choice. Patient denied any suicidal thoughts, just stated that she was depressed. Patient contracted for safety. Will continue to monitor.
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
## 2014-03-03 NOTE — Progress Notes (Signed)
Patient ID: Latoya Green, female   DOB: 1956-01-17, 57 y.o.   MRN: [id number] D: Client in bed, eyes closed, respirations even. A: Writer observed for s/s of distress. Staff to monitor q22min for safety. R: Client is safe on the unit, no distress noted, respirations unlabored.

## 2014-03-03 NOTE — Progress Notes (Signed)
D. Pt has been up and has been visible in milieu this evening, however did not attend evening karaoke group. Pt appears depressed on the unit, spoke about transitioning her handicap son out of the home and how it is very difficult for her as she spoke about how they depend on each other but feels it will be in his best interest in the long run. Pt did receive medications this evening without incident. A. Support and encouragement provided. R. Safety maintained, will continue to monitor.

## 2014-03-03 NOTE — Progress Notes (Signed)
D: Patient has depressed affect and mood. She reported on the self inventory sheet that sleep is poor, appetite and ability to concentrate are both good and normal energy level. Patient rates depression "5", feelings of hopelessness "9" and anxiety "4". She has not attended any of the morning groups. Patient adheres to all medications.  A: Support and encouragement provided to patient. Administered scheduled medications per ordering MD. Monitor Q15 minute checks for safety.  R: Patient receptive. Denies SI/HI and auditory/visual hallucinations. Patient remains safe on the unit.

## 2014-03-03 NOTE — BHH Group Notes (Signed)
[place] LCSW Group Therapy 03/03/2014  1:15 pm   Type of Therapy: Group Therapy Participation Level: Active  Participation Quality: Attentive, Sharing and Supportive  Affect: Depressed and Flat  Cognitive: Alert and Oriented  Insight: Developing/Improving and Engaged  Engagement in Therapy: Developing/Improving and Engaged  Modes of Intervention: Clarification, Confrontation, Discussion, Education, Exploration, Limit-setting, Orientation, Problem-solving, Rapport Building, Art therapist, Socialization and Support  Summary of Progress/Problems: The topic for group was balance in life. Today's group focused on defining balance in one's own words, identifying things that can knock one off balance, and exploring healthy ways to maintain balance in life. Group members were asked to provide an example of a time when they felt off balance, describe how they handled that situation,and process healthier ways to regain balance in the future. Group members were asked to share the most important tool for maintaining balance that they learned while at [facility] and how they plan to apply this method after discharge. Patient shared with group that she was able to speak with her employer this afternoon and still has her job. CSW's and other group members provided emotional support and encouragement.  Tilden Fossa, MSW, [place] Worker [facility] [phone]

## 2014-03-03 NOTE — Progress Notes (Signed)
Adult Psychoeducational Group Note  Date:  03/03/2014 Time:  9:00 AM  Group Topic/Focus:  Morning Wellness Group  Participation Level:  Did Not Attend   Kathlen Brunswick 03/03/2014, 12:38 PM

## 2014-03-03 NOTE — Progress Notes (Signed)
Pt attended karaoke group this evening.  

## 2014-03-04 MED ORDER — LOPERAMIDE HCL 2 MG PO CAPS
2.0000 mg | ORAL_CAPSULE | Freq: Once | ORAL | Status: AC
  Administered 2014-03-04: 2 mg via ORAL
  Filled 2014-03-04 (×2): qty 1

## 2014-03-04 NOTE — Progress Notes (Signed)
Patient ID: [name], female   DOB: 1955-10-09, 58 y.o.   MRN: [id number] Received report from B. McNichols, RN. D: client in bed, eyes closed, respirations even. A: Writer assessed for s/s of distress. Staff will monitor q75min for safety. R: Client is safe on the unit, no distress noted, respirations unlabored.

## 2014-03-04 NOTE — Progress Notes (Signed)
Patient ID: Latoya Green, female   DOB: [birth year], 58 y.o.   MRN: [id number] [facility] MD Progress Note  03/04/2014 11:36 AM Latoya Green  MRN:  [id number] Subjective:  States she is feeling less anxious and less upset  Objective:   I have discussed case with treatment team and met with patient. Staff reports that patient is improved, with a fuller range of affect, decreased delusional ruminations, and better related overall. Patient states she is feeling better. She does express concern , worry about her adult/disabled son, who is now being followed by DSS, and is reportedly going to a Publishing rights manager. States she will miss him but knows " it is probably best for him". 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 Since this good news has been feeling better. Delusional thoughts gradually improving , she continues to feel she was poisoned at one point, but is much less focused on this and no longer as somatically focused. Grooming is also slowly improving Behavior on unit in good control, visible in milieu. Denies medication side effects at this time.    Diagnosis:   MDD with psychotic features  Total Time spent with patient: 25 minutes     ADL's:  Improved   Sleep: fair   Appetite: improving  Suicidal Ideation:  Ongoing passive thoughts of death, but denies any plan or intention of hurting self and contracts for safety on the unit Homicidal Ideation:  Denies  AEB (as evidenced by):  Psychiatric Specialty Exam: Physical Exam  Review of Systems  Constitutional: Negative for fever and chills.  Respiratory: Negative for cough and shortness of breath.   Cardiovascular: Negative for chest pain.  Gastrointestinal: Negative for nausea and vomiting.       Today does not complain of nausea or abdominal discomfort  Skin: Negative.        Complains of " spots " and  itching  Diffusely on skin  Psychiatric/Behavioral: Positive for depression.       Delusions    Blood pressure 104/78, pulse 102, temperature 98.5 F (36.9 C), temperature source Oral, resp. rate 20, height 5' 9.5" (1.765 m), weight 78.472 kg (173 lb).Body mass index is 25.19 kg/(m^2).  General Appearance: gradually improving  Eye Contact::  Good  Speech:  Normal Rate  Volume:  Normal  Mood:  Less  Severely depressed, affect is more reactive   Affect:   Less constricted   Thought Process:  Linear- less ruminative  Orientation:  Other:  alert and attentive, no evidence of delirium  Thought Content:  Delusions are subsiding in intensity and severity but are still present. No hallucinations , and does not appear internally preoccupied at this time  Suicidal Thoughts:  Denies current suicidal ideations, and is able to contract for safety on the unit.  Homicidal Thoughts:  No  Memory:  recent and remote grossly intact  Judgement:  Fair  Insight:  Fair  Psychomotor Activity:  Normal  Concentration:  Good  Recall:  Good  Fund of Knowledge:Good  Language: Good  Akathisia:  Negative  Handed:  Right  AIMS (if indicated):     Assets:  Desire for Improvement Resilience  Sleep:  Number of Hours: 5   Musculoskeletal: Strength & Muscle Tone: within normal limits Gait & Station: normal Patient leans: N/A  Current Medications: Current Facility-Administered Medications  Medication Dose Route Frequency Provider Last Rate Last  Dose  . acetaminophen (TYLENOL) tablet 650 mg  650 mg Oral Q6H PRN Waylan Boga, NP      . alum & mag hydroxide-simeth (MAALOX/MYLANTA) 200-200-20 MG/5ML suspension 30 mL  30 mL Oral Q4H PRN Waylan Boga, NP      . ARIPiprazole (ABILIFY) tablet 15 mg  15 mg Oral QHS Neita Garnet, MD   15 mg at 03/03/14 2211  . carbamazepine (TEGRETOL XR) 12 hr tablet 200 mg  200 mg Oral BID Waylan Boga, NP   200 mg at 03/04/14 2197  . hydrocortisone cream 1 %   Topical BID Laverle Hobby, PA-C      . hydrOXYzine (ATARAX/VISTARIL) tablet 25 mg  25 mg Oral Q6H PRN Waylan Boga, NP      . magnesium hydroxide (MILK OF MAGNESIA) suspension 30 mL  30 mL Oral Daily PRN Waylan Boga, NP      . sertraline (ZOLOFT) tablet 150 mg  150 mg Oral Daily Neita Garnet, MD   150 mg at 03/04/14 5883    Lab Results:  No results found for this or any previous visit (from the past 48 hour(s)).  Physical Findings: AIMS: Facial and Oral Movements Muscles of Facial Expression: None, normal Lips and Perioral Area: None, normal Jaw: None, normal Tongue: None, normal,Extremity Movements Upper (arms, wrists, hands, fingers): None, normal Lower (legs, knees, ankles, toes): None, normal, Trunk Movements Neck, shoulders, hips: None, normal, Overall Severity Severity of abnormal movements (highest score from questions above): None, normal Incapacitation due to abnormal movements: None, normal Patient's awareness of abnormal movements (rate only patient's report): No Awareness, Dental Status Current problems with teeth and/or dentures?: No Does patient usually wear dentures?: No  CIWA:  CIWA-Ar Total: 2 COWS:  COWS Total Score: 3  Assessment Ongoing gradual improvement, with less severe depression, less intense delusional ruminations and somatic concerns. Mood clearly improved and affect more reactive, and insight improving , states she realizes she needs to " stay on medication in order not to get sick". Happy that her job is being held for her and she is still employed.   Treatment Plan Summary: Daily contact with patient to assess and evaluate symptoms and progress in treatment Medication management See below  Plan: 1. Continue inpatient treatment, milieu, support- although improved, based on severity of her illness and ongoing symptoms, I think further inpatient stabilization is warranted at this time. 2.  Abilify  15 mgrs QHS 3.  Zoloft 150 mgrs QAM 4. Tegretol XR  200 mgrs  BID  5.  Treatment team working on disposition planning.   Medical Decision Making Problem Points:  Established problem, stable/improving (1), Review of last therapy session (1) and Review of psycho-social stressors (1) Data Points:  Review of medication regiment & side effects (2)  I certify that inpatient services furnished can reasonably be expected to improve the patient's condition.   COBOS, FERNANDO 03/04/2014, 11:36 AM

## 2014-03-04 NOTE — [facility] Group Notes (Signed)
[facility] LCSW Group Therapy  Feelings Around Relapse 1:15 -2:30        03/04/2014   Type of Therapy:  Group Therapy  Participation Level:  Did not attend group due to not feeling well.   Concha Pyo 03/04/2014

## 2014-03-04 NOTE — Progress Notes (Signed)
D.  Pt pleasant on approach, denies complaints at this time.  Interacting appropriately with peers on the unit.  Pt did not attend group this evening.  A.  Support and encouragement offered  R.  Pt remains safe on unit, will continue to monitor.

## 2014-03-04 NOTE — Progress Notes (Signed)
Nursing Shift Note D: pt reports good sleep, good appetite, rated depression #5, rated anxiety #5, denies pain,SI, HI, AVH. Pt daily goal is to talk more. A: meds given per MD order. Support and encouragement given. Will continue to monitor for safety. R: pt is in a safe and therapeutic milieu. No acute distress.

## 2014-03-04 NOTE — [facility] Group Notes (Signed)
Adult Psychoeducational Group Note  Date:  03/04/2014 Time:  10:00 PM  Group Topic/Focus:  AA Meeting  Participation Level:  Did Not Attend  Participation Quality:  None  Affect:  None  Cognitive:  None  Insight: None  Engagement in Group:  None  Modes of Intervention:  Discussion and Education  Additional Comments:  Latoya Green did not attend group.  [name] A 03/04/2014, 10:00 PM

## 2014-03-04 NOTE — Tx Team (Signed)
Interdisciplinary Treatment Plan Update   Date Reviewed:  03/04/2014  Time Reviewed:  8:14 AM  Progress in Treatment:   Attending groups: Yes Participating in groups: Yes Taking medication as prescribed: Yes  Tolerating medication: Yes Family/Significant other contact made:  No, patient declines collateral contact. Patient understands diagnosis: Yes  Discussing patient identified problems/goals with staff: Yes Medical problems stabilized or resolved: Yes Denies suicidal/homicidal ideation: Yes Patient has not harmed self or others: Yes  For review of initial/current patient goals, please see plan of care.  Estimated Length of Stay:  3-4 days  Reasons for Continued Hospitalization:  Anxiety Depression Medication stabilization   New Problems/Goals identified:    Discharge Plan or Barriers:   Home with outpatient follow up at [facility]  Additional Comments:   Patient and CSW reviewed patient's identified goals and treatment plan.  Patient verbalized understanding and agreed to treatment plan.   Attendees:  Patient:  03/04/2014 8:14 AM   Signature:  Gabriel Earing, MD 03/04/2014 8:14 AM  Signature: Carlton Adam 03/04/2014 8:14 AM  Signature:  Talbert Cage, RN 03/04/2014 8:14 AM  Signature:  Drake Leach, RN 03/04/2014 8:14 AM  Signature: 03/04/2014 8:14 AM  Signature:  Joette Catching, LCSW 03/04/2014 8:14 AM  Signature:  Erasmo Downer Drinkard, LCSW-A 03/04/2014 8:14 AM  Signature:  Lucinda Dell, Care Coordinator [facility] 03/04/2014 8:14 AM  Signature:   03/04/2014 8:14 AM  Signature: 03/04/2014  8:14 AM  Signature:   Lars Pinks, RN [facility] 03/04/2014  8:14 AM  Signature:  Girard Worker LCSW 03/04/2014  8:14 AM    Scribe for Treatment Team:   Joette Catching,  03/04/2014 8:14 AM \

## 2014-03-04 NOTE — [facility] Group Notes (Signed)
[facility] LCSW Aftercare Discharge Planning Group Note   03/04/2014 8:15 AM    Participation Quality: Did not attend group due to meeting with MD.  Concha Pyo

## 2014-03-05 DIAGNOSIS — R45851 Suicidal ideations: Secondary | ICD-10-CM

## 2014-03-05 DIAGNOSIS — F323 Major depressive disorder, single episode, severe with psychotic features: Principal | ICD-10-CM

## 2014-03-05 NOTE — Plan of Care (Signed)
Problem: Diagnosis: Increased Risk For Suicide Attempt Goal: LTG-Patient Will Report Improved Mood and Deny Suicidal LTG (by discharge) Patient will report improved mood and deny suicidal ideation.  Outcome: Progressing Pt with bright affect this evening, interacting with roommate.  Denies suicidal ideation

## 2014-03-05 NOTE — Progress Notes (Signed)
D.  Pt pleasant on approach, denies complaints at this time.  Pt did not attend evening wrap up group.  Pt is interacting appropriately with peers on the unit.  Denies SI/HI/hallucinations at this time.  A.  Support and encouragement offered  R.  Pt remains safe at this time.

## 2014-03-05 NOTE — [facility] Group Notes (Signed)
[place] Group Notes: (Clinical Social Work)   03/05/2014      Type of Therapy:  Group Therapy   Participation Level:  Did Not Attend - was in bed and refused to come to group   Selmer Dominion, LCSW 03/05/2014, 12:53 PM

## 2014-03-05 NOTE — [facility] Group Notes (Signed)
[facility] Group Notes:Goals group  Date:  03/05/2014  Time:  9:42 AM  Type of Therapy:  Nurse Education  Participation Level:  Active  Participation Quality:  Appropriate  Affect:  Appropriate  Cognitive:  Alert  Insight:  Appropriate  Engagement in Group:  Engaged  Modes of Intervention:  Discussion  Summary of Progress/Problems:Pt stated her goal today was to do her packet.  Marcello Moores [facility] 03/05/2014, 9:42 AM

## 2014-03-05 NOTE — Progress Notes (Addendum)
Pt appears very blunted and flat this am. She has been walking up and down the hallway and has been attending groups but does not talk very much-Pt depression is a 4/10 and anxiety is a 6/10.Pt goal is to go home and work on her depression. She does contract for safety and denies Si and HI.4:30pm _pt did not want to go outdoors this afternoon. Her affect continues to remains flat and blunted.

## 2014-03-05 NOTE — Progress Notes (Signed)
Patient ID: Latoya Green, female   DOB: 10/14/1955, 58 y.o.   MRN: [id number] [facility] MD Progress Note  03/05/2014 11:15 AM Audree Brittingham  MRN:  [id number] Subjective:  States she is feeling much better. " I am working on my physical stuff, I have stomach issues yesterday it was diarrhea, and today it is like constipation.Mentally I feel pretty good." Currently rates her depression 2/10, anxiety 3/10, and hopelessness 0/10 at this time.  Objective:   I have discussed case with treatment team and met with patient. Staff reports that patient is improved, with a fuller range of affect, decreased delusional ruminations, and better related overall. Patient states she is feeling better.Grooming is also slowly improving. Behavior on unit in good control, visible in milieu. Denies medication side effects at this time. Reports compliance with all medications at this time.   Diagnosis:   MDD with psychotic features  Total Time spent with patient: 25 minutes   ADL's:  Improved   Sleep: fair   Appetite: improving  Suicidal Ideation:  Ongoing passive thoughts of death, but denies any plan or intention of hurting self and contracts for safety on the unit Homicidal Ideation:  Denies  AEB (as evidenced by):  Psychiatric Specialty Exam: Physical Exam   Review of Systems  Constitutional: Negative for fever and chills.  Respiratory: Negative for cough and shortness of breath.   Cardiovascular: Negative for chest pain.  Gastrointestinal: Negative for nausea and vomiting.       Today does not complain of nausea or abdominal discomfort  Skin: Negative.        Complains of " spots " and itching  Diffusely on skin  Psychiatric/Behavioral: Positive for depression.       Delusions    Blood pressure 120/85, pulse 95, temperature 98.7 F (37.1 C), temperature source Oral, resp. rate 16, height 5' 9.5" (1.765 m), weight 78.472 kg (173 lb).Body mass index is 25.19 kg/(m^2).  General Appearance: gradually  improving  Eye Contact::  Good  Speech:  Normal Rate  Volume:  Normal  Mood:  Euthmyic  Affect:   Less constricted   Thought Process:  Goal Directed and Intact  Orientation:  Other:  alert and attentive, no evidence of delirium  timeThought Content: WNL  Suicidal Thoughts:  Denies current suicidal ideations, and is able to contract for safety on the unit.  Homicidal Thoughts:  No  Memory:  recent and remote grossly intact  Judgement:  Fair  Insight:  Fair  Psychomotor Activity:  Normal  Concentration:  Good  Recall:  Good  Fund of Knowledge:Good  Language: Good  Akathisia:  Negative  Handed:  Right  AIMS (if indicated):     Assets:  Desire for Improvement Resilience  Sleep:  Number of Hours: 6.5   Musculoskeletal: Strength & Muscle Tone: within normal limits Gait & Station: normal Patient leans: N/A  Current Medications: Current Facility-Administered Medications  Medication Dose Route Frequency Provider Last Rate Last Dose  . acetaminophen (TYLENOL) tablet 650 mg  650 mg Oral Q6H PRN Jamison Lord, NP      . alum & mag hydroxide-simeth (MAALOX/MYLANTA) 200-200-20 MG/5ML suspension 30 mL  30 mL Oral Q4H PRN Jamison Lord, NP      . ARIPiprazole (ABILIFY) tablet 15 mg  15 mg Oral QHS Fernando Cobos, MD   15 mg at 03/04/14 2130  . carbamazepine (TEGRETOL XR) 12 hr tablet 200 mg  200 mg Oral BID Jamison Lord, NP   200 mg at 03/05/14 0803  .   hydrocortisone cream 1 %   Topical BID Laverle Hobby, PA-C      . hydrOXYzine (ATARAX/VISTARIL) tablet 25 mg  25 mg Oral Q6H PRN Waylan Boga, NP   25 mg at 03/04/14 2130  . magnesium hydroxide (MILK OF MAGNESIA) suspension 30 mL  30 mL Oral Daily PRN Waylan Boga, NP      . sertraline (ZOLOFT) tablet 150 mg  150 mg Oral Daily Neita Garnet, MD   150 mg at 03/05/14 0803    Lab Results:  No results found for this or any previous visit (from the past 30 hour(s)).  Physical Findings: AIMS: Facial and Oral Movements Muscles of Facial  Expression: None, normal Lips and Perioral Area: None, normal Jaw: None, normal Tongue: None, normal,Extremity Movements Upper (arms, wrists, hands, fingers): None, normal Lower (legs, knees, ankles, toes): None, normal, Trunk Movements Neck, shoulders, hips: None, normal, Overall Severity Severity of abnormal movements (highest score from questions above): None, normal Incapacitation due to abnormal movements: None, normal Patient's awareness of abnormal movements (rate only patient's report): No Awareness, Dental Status Current problems with teeth and/or dentures?: No Does patient usually wear dentures?: No  CIWA:  CIWA-Ar Total: 2 COWS:  COWS Total Score: 3  Assessment Ongoing gradual improvement, with less severe depression, less intense delusional ruminations and somatic concerns. Mood clearly improved and affect more reactive, and insight improving.    Treatment Plan Summary: Daily contact with patient to assess and evaluate symptoms and progress in treatment Medication management See below  Plan: 1. Continue inpatient treatment, milieu, support- although improved, based on severity of her illness and ongoing symptoms, I think further inpatient stabilization is warranted at this time. 2.  Abilify  15 mgrs QHS 3.  Zoloft 150 mgrs QAM 4. Tegretol XR  200 mgrs  BID  5. Treatment team working on disposition planning.   Medical Decision Making Problem Points:  Established problem, stable/improving (1), Review of last therapy session (1) and Review of psycho-social stressors (1) Data Points:  Review of medication regiment & side effects (2)  I certify that inpatient services furnished can reasonably be expected to improve the patient's condition.   Priscille Loveless S FNP-BC 03/05/2014, 11:15 AM  I agreed with the findings, treatment and disposition plan of this patient. Berniece Andreas, MD

## 2014-03-05 NOTE — [facility] Group Notes (Signed)
[place] Group Notes:  Healthy coping skills  Date:  03/05/2014  Time:  2:36 PM  Type of Therapy:  Nurse Education  Participation Level:  Active  Participation Quality:  Appropriate  Affect:  Appropriate  Cognitive:  Alert  Insight:  Appropriate  Engagement in Group:  Engaged  Modes of Intervention:  Discussion  Summary of Progress/Problems:  Latoya Green 03/05/2014, 2:36 PM

## 2014-03-05 NOTE — Progress Notes (Signed)
Did not attended group 

## 2014-03-06 NOTE — Progress Notes (Signed)
Psychoeducational Group Note  Date:  03/06/2014 Time:  1315  Group Topic/Focus:  Making Healthy Choices:   The focus of this group is to help patients identify negative/unhealthy choices they were using prior to admission and identify positive/healthier coping strategies to replace them upon discharge.  Participation Level:  Did not attend  Paulino Rily 03/06/2014

## 2014-03-06 NOTE — [facility] Group Notes (Signed)
[place] Group Notes: (Clinical Social Work)   03/06/2014      Type of Therapy:  Group Therapy   Participation Level:  Did Not Attend - would not respond to [facility] invitations to attend group   Selmer Dominion, [place] 03/06/2014, 12:48 PM

## 2014-03-06 NOTE — Progress Notes (Signed)
Patient ID: Latoya Green, female   DOB: 20-Oct-1955, 58 y.o.   MRN: [id number] [name] MD Progress Note  03/06/2014 3:08 PM Latoya Green  MRN:  [id number] Subjective:  States she is feeling much better.Pt is observed lying in her room, stating that she is tired. " Currently rates her depression 2/10, anxiety 3/10, and hopelessness 0/10 at this time. She is anticipating going home tomorrow. She states her fears and anxiety have decreased, now that things have been resolved with her son.   Objective:   Staff reports that patient is improved, with a fuller range of affect, decreased delusional ruminations, and better related overall. Patient states she is feeling better. Behavior on unit in good control, visible in milieu. Denies medication side effects at this time. Reports compliance with all medications at this time.   Diagnosis:   MDD with psychotic features  Total Time spent with patient: 25 minutes   ADL's:  Improved   Sleep: fair   Appetite: improving  Suicidal Ideation:  Ongoing passive thoughts of death, but denies any plan or intention of hurting self and contracts for safety on the unit Homicidal Ideation:  Denies  AEB (as evidenced by):  Psychiatric Specialty Exam: Physical Exam   Review of Systems  Constitutional: Negative for fever and chills.  Respiratory: Negative for cough and shortness of breath.   Cardiovascular: Negative for chest pain.  Gastrointestinal: Negative for nausea and vomiting.       Today does not complain of nausea or abdominal discomfort  Skin: Negative.        Complains of " spots " and itching  Diffusely on skin  Psychiatric/Behavioral: Positive for depression.       Delusions    Blood pressure 140/78, pulse 92, temperature 97.8 F (36.6 C), temperature source Oral, resp. rate 18, height 5' 9.5" (1.765 m), weight 78.472 kg (173 lb).Body mass index is 25.19 kg/(m^2).  General Appearance: gradually improving  Eye Contact::  Good  Speech:  Normal  Rate  Volume:  Normal  Mood:  Euthmyic  Affect:   Less constricted   Thought Process:  Goal Directed and Intact  Orientation:  Other:  alert and attentive, no evidence of delirium  timeThought Content: WNL  Suicidal Thoughts:  Denies current suicidal ideations, and is able to contract for safety on the unit.  Homicidal Thoughts:  No  Memory:  recent and remote grossly intact  Judgement:  Fair  Insight:  Fair  Psychomotor Activity:  Normal  Concentration:  Good  Recall:  Good  Fund of Knowledge:Good  Language: Good  Akathisia:  Negative  Handed:  Right  AIMS (if indicated):     Assets:  Desire for Improvement Resilience  Sleep:  Number of Hours: 5   Musculoskeletal: Strength & Muscle Tone: within normal limits Gait & Station: normal Patient leans: N/A  Current Medications: Current Facility-Administered Medications  Medication Dose Route Frequency Provider Last Rate Last Dose  . acetaminophen (TYLENOL) tablet 650 mg  650 mg Oral Q6H PRN Waylan Boga, NP      . alum & mag hydroxide-simeth (MAALOX/MYLANTA) 200-200-20 MG/5ML suspension 30 mL  30 mL Oral Q4H PRN Waylan Boga, NP      . ARIPiprazole (ABILIFY) tablet 15 mg  15 mg Oral QHS Neita Garnet, MD   15 mg at 03/05/14 2154  . carbamazepine (TEGRETOL XR) 12 hr tablet 200 mg  200 mg Oral BID Waylan Boga, NP   200 mg at 03/06/14 0742  . hydrocortisone cream 1 %  Topical BID Laverle Hobby, PA-C      . hydrOXYzine (ATARAX/VISTARIL) tablet 25 mg  25 mg Oral Q6H PRN Waylan Boga, NP   25 mg at 03/04/14 2130  . magnesium hydroxide (MILK OF MAGNESIA) suspension 30 mL  30 mL Oral Daily PRN Waylan Boga, NP      . sertraline (ZOLOFT) tablet 150 mg  150 mg Oral Daily Neita Garnet, MD   150 mg at 03/06/14 3235    Lab Results:  No results found for this or any previous visit (from the past 48 hour(s)).  Physical Findings: AIMS: Facial and Oral Movements Muscles of Facial Expression: None, normal Lips and Perioral Area: None,  normal Jaw: None, normal Tongue: None, normal,Extremity Movements Upper (arms, wrists, hands, fingers): None, normal Lower (legs, knees, ankles, toes): None, normal, Trunk Movements Neck, shoulders, hips: None, normal, Overall Severity Severity of abnormal movements (highest score from questions above): None, normal Incapacitation due to abnormal movements: None, normal Patient's awareness of abnormal movements (rate only patient's report): No Awareness, Dental Status Current problems with teeth and/or dentures?: No Does patient usually wear dentures?: No  CIWA:  CIWA-Ar Total: 2 COWS:  COWS Total Score: 3  Assessment Ongoing gradual improvement, with less severe depression, less intense delusional ruminations and somatic concerns. Mood clearly improved and affect more reactive, and insight improving.    Treatment Plan Summary: Daily contact with patient to assess and evaluate symptoms and progress in treatment Medication management See below  Plan: 1. Continue inpatient treatment, milieu, support- although improved, based on severity of her illness and ongoing symptoms, I think further inpatient stabilization is warranted at this time. 2.  Abilify  15 mgrs QHS 3.  Zoloft 150 mgrs QAM 4. Tegretol XR  200 mgrs  BID  5. Treatment team working on disposition planning.   Medical Decision Making Problem Points:  Established problem, stable/improving (1), Review of last therapy session (1) and Review of psycho-social stressors (1) Data Points:  Review of medication regiment & side effects (2)  I certify that inpatient services furnished can reasonably be expected to improve the patient's condition.   Priscille Loveless S FNP-BC 03/06/2014, 3:08 PM  I agreed with the findings, treatment and disposition plan of this patient. Berniece Andreas, MD

## 2014-03-06 NOTE — Progress Notes (Signed)
Patient ID: Latoya Green, female   DOB: 04-19-1956, 58 y.o.   MRN: [id number] D: Patient has minimal interaction with staff this morning.  She is quiet and subdued.  She denies any SI/HI/AVH.  She presents with flat, blunted affect.  She is attending some groups.  Patient is uncertain where she will go after discharge.  She is concerned she will have to go a shelter.  She reports her depressive symptoms have decreased; she rates her depression as a 2; hopelessness as a 3.  She denies any anxiety.   A: Continue to monitor medication management and MD orders.  Safety checks completed every 15 minutes per protocol. Encourage patient to attend groups and participate in her treatment. R: Patient is cooperative; her behavior is appropriate.

## 2014-03-06 NOTE — Progress Notes (Signed)
Psychoeducational Group Note  Date: 03/06/2014 Time: 1015  Group Topic/Focus:  Being Greatful: This group is focused on helping patients identify 3 pillars of strength in their lives as well as something they've learned thus far in their lives.  Participation Level:  Minimal  Participation Quality:  Attentive  Affect:  Flat  Cognitive:  Alert  Insight:  Limited  Engagement in Group:  Engaged  Additional Comments:    03/06/2014,10:22 AM Rob Hickman Trixie Rude

## 2014-03-06 NOTE — Progress Notes (Signed)
Did not attended group 

## 2014-03-07 DIAGNOSIS — F329 Major depressive disorder, single episode, unspecified: Secondary | ICD-10-CM

## 2014-03-07 MED ORDER — ARIPIPRAZOLE 15 MG PO TABS: 15.0000 mg | ORAL_TABLET | Freq: Every day | ORAL | Status: DC

## 2014-03-07 MED ORDER — SERTRALINE HCL 50 MG PO TABS
150.0000 mg | ORAL_TABLET | Freq: Every day | ORAL | Status: DC
  Filled 2014-03-07: qty 42

## 2014-03-07 MED ORDER — HYDROCORTISONE 1 % EX CREA: 1.0000 "application " | TOPICAL_CREAM | Freq: Every day | CUTANEOUS | Status: DC

## 2014-03-07 MED ORDER — SERTRALINE HCL 50 MG PO TABS: 150.0000 mg | ORAL_TABLET | Freq: Every day | ORAL | Status: DC

## 2014-03-07 MED ORDER — CARBAMAZEPINE ER 200 MG PO TB12: 200.0000 mg | ORAL_TABLET | Freq: Two times a day (BID) | ORAL | Status: DC

## 2014-03-07 MED ORDER — HYDROXYZINE HCL 25 MG PO TABS: 25.0000 mg | ORAL_TABLET | Freq: Four times a day (QID) | ORAL | Status: DC | PRN

## 2014-03-07 NOTE — Progress Notes (Signed)
[facility] Adult Case Management Discharge Plan :  Will you be returning to the same living situation after discharge: Yes,  Patient is returning to her home. At discharge, do you have transportation home?:Yes,  Patient to arrange transportation home. Do you have the ability to pay for your medications:No.  Patient needs assistance with indigent medications   Release of information consent forms completed and in the chart;  Patient's signature needed at discharge.  Patient to Follow up at: Follow-up Information   Follow up with Dr. Ramond Craver On 03/08/2014. (You are scheduled with Dr. Marily Memos on Tuesday, March 08, 2014 atr 11:45 AM)    Contact information:   [address], [place]   [address]  [phone]      Patient denies SI/HI:  Patient no longer endorsing SI/HI or other thoughts of self harm.   Safety Planning and Suicide Prevention discussed:.Reviewed with all patients during discharge planning group   Jodette Wik, Eulas Post 03/07/2014, 10:24 AM

## 2014-03-07 NOTE — BHH Suicide Risk Assessment (Signed)
Suicide Risk Assessment  Discharge Assessment     Demographic Factors:  Caucasian  Total Time spent with patient: 30 minutes  Psychiatric Specialty Exam:     Blood pressure 123/81, pulse 86, temperature 97.5 F (36.4 C), temperature source Oral, resp. rate 20, height 5' 9.5" (1.765 m), weight 78.472 kg (173 lb).Body mass index is 25.19 kg/(m^2).  General Appearance: Fairly Groomed  Engineer, water::  Fair  Speech:  Clear and Coherent  Volume:  Normal  Mood:  Euthymic  Affect:  Appropriate  Thought Process:  Coherent and Goal Directed  Orientation:  Full (Time, Place, and Person)  Thought Content:  plans as she moves on, wants to be D/C today and get back to work  Suicidal Thoughts:  No  Homicidal Thoughts:  No  Memory:  Immediate;   Fair Recent;   Fair Remote;   Fair  Judgement:  Fair  Insight:  Present  Psychomotor Activity:  Normal  Concentration:  Fair  Recall:  [employer] of Knowledge:NA  Language: Fair  Akathisia:  No  Handed:    AIMS (if indicated):     Assets:  Desire for Improvement Housing Vocational/Educational  Sleep:  Number of Hours: 6.25    Musculoskeletal: Strength & Muscle Tone: within normal limits Gait & Station: normal Patient leans: N/A   Mental Status Per Nursing Assessment::   On Admission:  Suicidal ideation indicated by patient  Current Mental Status by Physician: In full contact with reality. Denies SI plans or intent. Expressed readiness to be D/C home today. She wants to get to work this afternoon. States that one of her sources of stress is her 80 some year old son who has some disabilities and is still living with her. She states that placement has been arranged in a group home.    Loss Factors: NA  Historical Factors: NA  Risk Reduction Factors:   Sense of responsibility to family and Employed  Continued Clinical Symptoms:  Depression:   Severe  Cognitive Features That Contribute To Risk: None identified   Suicide Risk:   Minimal: No identifiable suicidal ideation.  Patients presenting with no risk factors but with morbid ruminations; may be classified as minimal risk based on the severity of the depressive symptoms  Discharge Diagnoses:   AXIS I:  Major Depression single episode severe AXIS II:  No diagnosis AXIS III:   Past Medical History  Diagnosis Date  . Epilepsy   . Cancer    AXIS IV:  other psychosocial or environmental problems AXIS V:  61-70 mild symptoms  Plan Of Care/Follow-up recommendations:  Activity:  as tolerated Diet:  regular Follow up outpatient basis Is patient on multiple antipsychotic therapies at discharge:  No   Has Patient had three or more failed trials of antipsychotic monotherapy by history:  No  Recommended Plan for Multiple Antipsychotic Therapies: NA    Latoya Green A 03/07/2014, 1:22 PM

## 2014-03-07 NOTE — BHH Group Notes (Signed)
[facility] LCSW Aftercare Discharge Planning Group Note   03/07/2014 8:24 AM    Participation Quality:  Appropraite  Mood/Affect:  Appropriate  Depression Rating:  0  Anxiety Rating:  0  Thoughts of Suicide:  No  Will you contract for safety?   NA  Current AVH:  No  Plan for Discharge/Comments:  Patient attended discharge planning group and actively participated in group.  Patient to discharge home and follow up with [facility].  CSW provided all participants with daily workbook.   Transportation Means: Patient has transportation.   Supports:  Patient has a support system.   Ishia Tenorio, Eulas Post

## 2014-03-07 NOTE — Discharge Summary (Signed)
Physician Discharge Summary Note  [name] is an 58 y.o., female MRN:  [id number] DOB:  1955/10/21 Patient phone:  [phone] (home)  Patient address:   [address] [place] [address],  Total Time spent with patient: 30 minutes  Date of Admission:  02/28/2014 Date of Discharge: 03/07/2014  Reason for Admission:  MDD (major depressive disorder), single episode, severe with psychotic features, major depression  Discharge Diagnoses: Active Problems:   Major depression   Psychiatric Specialty Exam: Physical Exam  Skin: Rash (resolving) noted.  Psychiatric: She has a normal mood and affect. Her behavior is normal. Judgment and thought content normal.    Review of Systems  Constitutional: Negative.   HENT: Negative.   Eyes: Negative.   Respiratory: Negative.   Cardiovascular: Negative.   Gastrointestinal: Negative.   Genitourinary: Negative.   Musculoskeletal: Negative.   Skin: Positive for rash.       resolving  Neurological: Negative.   Endo/Heme/Allergies: Negative.   Psychiatric/Behavioral: Positive for depression (Hx of, stabilized, chronic). Negative for suicidal ideas, hallucinations, memory loss and substance abuse. The patient is nervous/anxious. The patient does not have insomnia.     Blood pressure 123/81, pulse 86, temperature 97.5 F (36.4 C), temperature source Oral, resp. rate 20, height 5' 9.5" (1.765 m), weight 78.472 kg (173 lb).Body mass index is 25.19 kg/(m^2).   Past Psychiatric History: Diagnosis:    MDD (major depressive disorder), single episode, severe with psychotic features, major depression  Hospitalizations:  [facility] Adult inpatient  Outpatient Care:  [facility]  Substance Abuse Care:  NA  Self-Mutilation:  NA  Suicidal Attempts:  Violent Behaviors:   Musculoskeletal: Strength & Muscle Tone: within normal limits Gait & Station: normal Patient leans: N/A  DSM5:  Schizophrenia Disorders:   NA Obsessive-Compulsive Disorders:  NA Trauma-Stressor Disorders:  NA Substance/Addictive Disorders:  NA Depressive Disorders:  Major Depressive Disorder - Severe (296.23) and Major Depressive Disorder - with Psychotic Features (296.24)  Axis Diagnosis:   AXIS I:  Major Depression, single episode AXIS II:  Deferred AXIS III:   Past Medical History  Diagnosis Date  . Epilepsy   . Cancer    AXIS IV:  economic problems, housing problems and other psychosocial or environmental problems AXIS V:  61-70 mild symptoms  Level of Care:  OP  Hospital Course:  Latoya Green is a 58 year old woman known to our service from recent admission in July 2015.  She has history of depression, somatic /paranoid delusions at that time.  She stopped taking her meds for several weeks and she presented to [facility] with same psychotic symptoms of depression and paranoia.  Patient also has a skin rash that she attributes to "poison chemical/voodoo chemical".    Upon admission, evaluation and treatment, it was determined that Latoya Green would need medication management to re-stabilize her depressive mood and her paranoid state.  She was administered Ariprazole 15 mg for mood stabilization, Carbamazepine 200 mg for mood control, Hydroxyzine 25 mg for anxiety and Sertraline 50 mg for depression.  She will be prescribed these medication upon discharge for maintenance.  To treat the skin rash, Latoya Green will be discharged on Hydrocortisone 1% to help with skin rash that has started to resolve.    Latoya Green also attended group therapy sessions to help learn and develop better coping skills for daily life stressors.  She was encouraged to remain compliant wit her meds and was give sample supply from [facility].  Follow up appointments was also  set up at [facility], [place], [place] and she was advised and encouraged to be adherent to make these appointments.    Consults:  psychiatry  Significant Diagnostic Studies:  labs:  per ED  Discharge Vitals:   Blood pressure 123/81, pulse 86, temperature 97.5 F (36.4 C), temperature source Oral, resp. rate 20, height 5' 9.5" (1.765 m), weight 78.472 kg (173 lb). Body mass index is 25.19 kg/(m^2). Lab Results:   No results found for this or any previous visit (from the past 72 hour(s)).  Physical Findings: AIMS: Facial and Oral Movements Muscles of Facial Expression: None, normal Lips and Perioral Area: None, normal Jaw: None, normal Tongue: None, normal,Extremity Movements Upper (arms, wrists, hands, fingers): None, normal Lower (legs, knees, ankles, toes): None, normal, Trunk Movements Neck, shoulders, hips: None, normal, Overall Severity Severity of abnormal movements (highest score from questions above): None, normal Incapacitation due to abnormal movements: None, normal Patient's awareness of abnormal movements (rate only patient's report): No Awareness, Dental Status Current problems with teeth and/or dentures?: No Does patient usually wear dentures?: No  CIWA:  CIWA-Ar Total: 2 COWS:  COWS Total Score: 3  Psychiatric Specialty Exam: See Psychiatric Specialty Exam and Suicide Risk Assessment completed by Attending Physician prior to discharge.  Discharge destination:  Home  Is patient on multiple antipsychotic therapies at discharge:  No   Has Patient had three or more failed trials of antipsychotic monotherapy by history:  No  Recommended Plan for Multiple Antipsychotic Therapies: NA     Medication List    ASK your doctor about these medications     Indication   carbamazepine 200 MG 12 hr capsule  Commonly known as:  CARBATROL  Take 1 capsule (200 mg total) by mouth 2 (two) times daily.   Indication:  mood stabilization     hydrocortisone cream 1 %  Apply 1 application topically daily. Face      hydrOXYzine 25 MG tablet  Commonly known as:  ATARAX/VISTARIL  Take 1 tablet (25 mg total) by mouth every 6 (six) hours as needed for anxiety.    Indication:  anxiety     sertraline 100 MG tablet  Commonly known as:  ZOLOFT  Take 1 tablet (100 mg total) by mouth daily.   Indication:  mood stabilization           Follow-up Information   Follow up with Dr. Ramond Craver On 03/08/2014. (You are scheduled with Dr. Marily Memos on Tuesday, March 08, 2014 atr 11:45 AM)    Contact information:   [address], [place]   [address]  [phone]      Follow-up recommendations:  Activity:  As tolerated Diet:  As tolerated  Comments:  1.  Take all your medications as prescribed.              2.  Report any adverse side effects to outpatient provider.                       3.  Patient instructed to not use alcohol or illegal drugs while on prescription medicines.            4.  In the event of worsening symptoms, instructed patient to call 911, the crisis hotline or go to nearest emergency room for evaluation of symptoms.  Total Discharge Time:  Greater than 30 minutes.  SignedKerrie Buffalo MAY, AGNP-BC 03/07/2014, 10:42 AM I personally assessed the patient and formulated the plan Geralyn Flash A. [place],  M.D. 

## 2014-03-07 NOTE — Progress Notes (Signed)
Patient ID: Latoya Green, female   DOB: 05/22/1956, 58 y.o.   MRN: [id number]  Pt. Denies SI/HI and A/V hallucinations. Belongings returned to patient at time of discharge. Patient denies any pain or discomfort. Discharge instructions and medications were reviewed with patient. Patient verbalized understanding of both medications and discharge instructions. Patient rates her hopelessness and anxiety at 1/10 for the day. Q15 minute safety checks maintained until discharge. No distress noted upon discharge.

## 2014-03-07 NOTE — Progress Notes (Signed)
Assumed care of patient at 2300. Pt rested overnight without complaint. No distress. Level III obs in place and pt remains safe. Jamie Kato

## 2014-03-09 NOTE — Progress Notes (Signed)
Patient Discharge Instructions:  After Visit Summary (AVS):   Faxed to:  03/09/14 Discharge Summary Note:   Faxed to:  03/09/14 Psychiatric Admission Assessment Note:   Faxed to:  03/09/14 Suicide Risk Assessment - Discharge Assessment:   Faxed to:  03/09/14 Faxed/Sent to the Next Level Care provider:  03/09/14 Faxed to [facility], 03/09/2014, 3:57 PM

## 2014-04-05 ENCOUNTER — Encounter (HOSPITAL_COMMUNITY): Payer: Self-pay | Admitting: Behavioral Health

## 2014-04-05 ENCOUNTER — Inpatient Hospital Stay (HOSPITAL_COMMUNITY)
Admission: AD | Admit: 2014-04-05 | Discharge: 2014-04-11 | DRG: 885 | Disposition: A | Discharge status: Home / Self Care | Payer: Federal, State, Local not specified - Other | Source: Intra-hospital | Attending: Psychiatry | Admitting: Psychiatry

## 2014-04-05 ENCOUNTER — Emergency Department (HOSPITAL_COMMUNITY)
Admission: EM | Admit: 2014-04-05 | Discharge: 2014-04-05 | Disposition: A | Discharge status: Other Acute Inpatient Hospital | Payer: Self-pay | Attending: Emergency Medicine | Admitting: Emergency Medicine

## 2014-04-05 ENCOUNTER — Encounter (HOSPITAL_COMMUNITY): Payer: Self-pay | Admitting: Emergency Medicine

## 2014-04-05 DIAGNOSIS — Z9114 Patient's other noncompliance with medication regimen: Secondary | ICD-10-CM | POA: Diagnosis present

## 2014-04-05 DIAGNOSIS — R45851 Suicidal ideations: Secondary | ICD-10-CM | POA: Diagnosis present

## 2014-04-05 DIAGNOSIS — Z599 Problem related to housing and economic circumstances, unspecified: Secondary | ICD-10-CM

## 2014-04-05 DIAGNOSIS — F251 Schizoaffective disorder, depressive type: Secondary | ICD-10-CM

## 2014-04-05 DIAGNOSIS — F329 Major depressive disorder, single episode, unspecified: Secondary | ICD-10-CM

## 2014-04-05 DIAGNOSIS — Z87828 Personal history of other (healed) physical injury and trauma: Secondary | ICD-10-CM | POA: Insufficient documentation

## 2014-04-05 DIAGNOSIS — F333 Major depressive disorder, recurrent, severe with psychotic symptoms: Secondary | ICD-10-CM | POA: Diagnosis present

## 2014-04-05 DIAGNOSIS — F259 Schizoaffective disorder, unspecified: Secondary | ICD-10-CM

## 2014-04-05 DIAGNOSIS — F17210 Nicotine dependence, cigarettes, uncomplicated: Secondary | ICD-10-CM | POA: Diagnosis present

## 2014-04-05 DIAGNOSIS — G4700 Insomnia, unspecified: Secondary | ICD-10-CM | POA: Diagnosis present

## 2014-04-05 DIAGNOSIS — Z85828 Personal history of other malignant neoplasm of skin: Secondary | ICD-10-CM

## 2014-04-05 DIAGNOSIS — G43909 Migraine, unspecified, not intractable, without status migrainosus: Secondary | ICD-10-CM | POA: Insufficient documentation

## 2014-04-05 DIAGNOSIS — F32A Depression, unspecified: Secondary | ICD-10-CM

## 2014-04-05 DIAGNOSIS — Z880 Allergy status to penicillin: Secondary | ICD-10-CM | POA: Insufficient documentation

## 2014-04-05 DIAGNOSIS — Z79899 Other long term (current) drug therapy: Secondary | ICD-10-CM | POA: Insufficient documentation

## 2014-04-05 DIAGNOSIS — Z720 Tobacco use: Secondary | ICD-10-CM | POA: Insufficient documentation

## 2014-04-05 HISTORY — DX: Major depressive disorder, single episode, unspecified: F32.9

## 2014-04-05 HISTORY — DX: Depression, unspecified: F32.A

## 2014-04-05 LAB — COMPREHENSIVE METABOLIC PANEL
ALT: 6 U/L (ref 0–35)
AST: 18 U/L (ref 0–37)
Albumin: 3.8 g/dL (ref 3.5–5.2)
Alkaline Phosphatase: 96 U/L (ref 39–117)
Anion gap: 13 (ref 5–15)
BUN: 8 mg/dL (ref 6–23)
CHLORIDE: 97 meq/L (ref 96–112)
CO2: 25 meq/L (ref 19–32)
Calcium: 9.7 mg/dL (ref 8.4–10.5)
Creatinine, Ser: 0.62 mg/dL (ref 0.50–1.10)
GFR calc non Af Amer: >90 mL/min (ref >90)
GLUCOSE: 121 mg/dL — AB (ref 70–99)
POTASSIUM: 4.1 meq/L (ref 3.7–5.3)
Sodium: 135 mEq/L — ABNORMAL LOW (ref 137–147)
Total Protein: 7.5 g/dL (ref 6.0–8.3)

## 2014-04-05 LAB — CBC WITH DIFFERENTIAL/PLATELET
BASOS ABS: 0 10*3/uL (ref 0.0–0.1)
Basophils Relative: 0 % (ref 0–1)
EOS PCT: 0 % (ref 0–5)
Eosinophils Absolute: 0 10*3/uL (ref 0.0–0.7)
HCT: 39.1 % (ref 36.0–46.0)
Hemoglobin: 12.5 g/dL (ref 12.0–15.0)
LYMPHS ABS: 0.9 10*3/uL (ref 0.7–4.0)
LYMPHS PCT: 13 % (ref 12–46)
MCH: 23.1 pg — ABNORMAL LOW (ref 26.0–34.0)
MCHC: 32 g/dL (ref 30.0–36.0)
MCV: 72.4 fL — AB (ref 78.0–100.0)
Monocytes Absolute: 0.3 10*3/uL (ref 0.1–1.0)
Monocytes Relative: 5 % (ref 3–12)
NEUTROS PCT: 82 % — AB (ref 43–77)
Neutro Abs: 5.8 10*3/uL (ref 1.7–7.7)
Platelets: 235 10*3/uL (ref 150–400)
RBC: 5.4 MIL/uL — AB (ref 3.87–5.11)
RDW: 18.2 % — ABNORMAL HIGH (ref 11.5–15.5)
WBC: 7.1 10*3/uL (ref 4.0–10.5)

## 2014-04-05 LAB — ETHANOL: Alcohol, Ethyl (B): <11 mg/dL (ref 0–11)

## 2014-04-05 LAB — RAPID URINE DRUG SCREEN, HOSP PERFORMED
Amphetamines: NOT DETECTED
BARBITURATES: NOT DETECTED
Benzodiazepines: NOT DETECTED
COCAINE: NOT DETECTED
Opiates: NOT DETECTED
TETRAHYDROCANNABINOL: NOT DETECTED

## 2014-04-05 LAB — CARBAMAZEPINE LEVEL, TOTAL: Carbamazepine Lvl: 3.5 ug/mL — ABNORMAL LOW (ref 4.0–12.0)

## 2014-04-05 MED ORDER — IBUPROFEN 200 MG PO TABS
600.0000 mg | ORAL_TABLET | Freq: Three times a day (TID) | ORAL | Status: DC | PRN
Start: 2014-04-05 — End: 2014-04-05

## 2014-04-05 MED ORDER — CARBAMAZEPINE ER 200 MG PO TB12
200.0000 mg | ORAL_TABLET | Freq: Two times a day (BID) | ORAL | Status: DC
  Filled 2014-04-05: qty 1

## 2014-04-05 MED ORDER — SERTRALINE HCL 50 MG PO TABS
50.0000 mg | ORAL_TABLET | Freq: Every day | ORAL | Status: DC
  Administered 2014-04-06: 50 mg via ORAL
  Filled 2014-04-05 (×3): qty 1

## 2014-04-05 MED ORDER — ARIPIPRAZOLE 15 MG PO TABS
15.0000 mg | ORAL_TABLET | Freq: Every day | ORAL | Status: DC
  Administered 2014-04-05 – 2014-04-10 (×6): 15 mg via ORAL
  Filled 2014-04-05 (×7): qty 1
  Filled 2014-04-05: qty 14
  Filled 2014-04-05 (×2): qty 1

## 2014-04-05 MED ORDER — ACETAMINOPHEN 325 MG PO TABS: 650.0000 mg | ORAL_TABLET | ORAL | Status: DC | PRN

## 2014-04-05 MED ORDER — MAGNESIUM HYDROXIDE 400 MG/5ML PO SUSP: 30.0000 mL | Freq: Every day | ORAL | Status: DC | PRN

## 2014-04-05 MED ORDER — TRAZODONE HCL 50 MG PO TABS
50.0000 mg | ORAL_TABLET | Freq: Every evening | ORAL | Status: DC | PRN
Start: 2014-04-05 — End: 2014-04-11
  Administered 2014-04-05: 50 mg via ORAL
  Filled 2014-04-05 (×8): qty 1
  Filled 2014-04-05: qty 28
  Filled 2014-04-05 (×4): qty 1
  Filled 2014-04-05: qty 28
  Filled 2014-04-05 (×6): qty 1

## 2014-04-05 MED ORDER — ONDANSETRON HCL 4 MG PO TABS: 4.0000 mg | ORAL_TABLET | Freq: Three times a day (TID) | ORAL | Status: DC | PRN

## 2014-04-05 MED ORDER — ALUM & MAG HYDROXIDE-SIMETH 200-200-20 MG/5ML PO SUSP
30.0000 mL | ORAL | Status: DC | PRN
  Administered 2014-04-07: 30 mL via ORAL

## 2014-04-05 MED ORDER — LOPERAMIDE HCL 2 MG PO CAPS
2.0000 mg | ORAL_CAPSULE | ORAL | Status: DC | PRN
  Administered 2014-04-06 – 2014-04-07 (×2): 4 mg via ORAL
  Filled 2014-04-05 (×2): qty 2

## 2014-04-05 MED ORDER — IBUPROFEN 200 MG PO TABS: 600.0000 mg | ORAL_TABLET | Freq: Three times a day (TID) | ORAL | Status: DC | PRN

## 2014-04-05 MED ORDER — ZOLPIDEM TARTRATE 5 MG PO TABS: 5.0000 mg | ORAL_TABLET | Freq: Every evening | ORAL | Status: DC | PRN

## 2014-04-05 MED ORDER — HYDROXYZINE HCL 25 MG PO TABS
25.0000 mg | ORAL_TABLET | Freq: Four times a day (QID) | ORAL | Status: DC | PRN
  Administered 2014-04-05: 25 mg via ORAL
  Filled 2014-04-05: qty 1

## 2014-04-05 MED ORDER — ARIPIPRAZOLE 15 MG PO TABS
15.0000 mg | ORAL_TABLET | Freq: Every day | ORAL | Status: DC
  Filled 2014-04-05: qty 1

## 2014-04-05 MED ORDER — LORAZEPAM 1 MG PO TABS: 1.0000 mg | ORAL_TABLET | Freq: Three times a day (TID) | ORAL | Status: DC | PRN

## 2014-04-05 MED ORDER — SERTRALINE HCL 100 MG PO TABS
100.0000 mg | ORAL_TABLET | Freq: Every day | ORAL | Status: DC
Start: 2014-04-06 — End: 2014-04-06
  Administered 2014-04-06: 100 mg via ORAL
  Filled 2014-04-05 (×3): qty 1

## 2014-04-05 MED ORDER — ACETAMINOPHEN 325 MG PO TABS: 650.0000 mg | ORAL_TABLET | Freq: Four times a day (QID) | ORAL | Status: DC | PRN

## 2014-04-05 MED ORDER — ALUM & MAG HYDROXIDE-SIMETH 200-200-20 MG/5ML PO SUSP: 30.0000 mL | ORAL | Status: DC | PRN

## 2014-04-05 MED ORDER — IBUPROFEN 600 MG PO TABS
600.0000 mg | ORAL_TABLET | Freq: Three times a day (TID) | ORAL | Status: DC | PRN
  Filled 2014-04-05: qty 1

## 2014-04-05 MED ORDER — HYDROXYZINE HCL 25 MG PO TABS
25.0000 mg | ORAL_TABLET | Freq: Four times a day (QID) | ORAL | Status: DC | PRN
  Administered 2014-04-06 – 2014-04-10 (×6): 25 mg via ORAL
  Filled 2014-04-05 (×4): qty 1
  Filled 2014-04-05: qty 30
  Filled 2014-04-05 (×4): qty 1

## 2014-04-05 MED ORDER — NICOTINE 21 MG/24HR TD PT24
21.0000 mg | MEDICATED_PATCH | Freq: Every day | TRANSDERMAL | Status: DC
  Administered 2014-04-05: 21 mg via TRANSDERMAL
  Filled 2014-04-05: qty 1

## 2014-04-05 MED ORDER — SERTRALINE HCL 50 MG PO TABS
100.0000 mg | ORAL_TABLET | Freq: Every day | ORAL | Status: DC
  Administered 2014-04-05: 100 mg via ORAL
  Filled 2014-04-05: qty 2

## 2014-04-05 MED ORDER — CARBAMAZEPINE ER 200 MG PO TB12
200.0000 mg | ORAL_TABLET | Freq: Two times a day (BID) | ORAL | Status: DC
  Administered 2014-04-05 – 2014-04-11 (×12): 200 mg via ORAL
  Filled 2014-04-05: qty 28
  Filled 2014-04-05 (×5): qty 1
  Filled 2014-04-05: qty 28
  Filled 2014-04-05 (×14): qty 1

## 2014-04-05 NOTE — BHH Counselor (Signed)
Counselor faxed referrals to [facility], [facility], and [place].  Lorenza Cambridge, [facility] Triage Specialist

## 2014-04-05 NOTE — ED Notes (Signed)
Pt reports she is SI, with  Plan to starve herself, she is not eating, or to OD or hurt herself with knives. Pt called [facility] today when she was feeling SI, they told her to come to ED. Pt had previous SI attempt 29 years ago. Pt denies HI, AH/VH. Denies ETOH or drugs.

## 2014-04-05 NOTE — ED Notes (Signed)
1 pt belonging bag in locker #27

## 2014-04-05 NOTE — ED Notes (Signed)
Patient up pacing the halls on admit to the unit.  States she is having constant thoughts of suicide.  States she recently had to put her son in a group home.   States she has been unable to work as she has been too sick.  Focused on a rash on her face.  States she has applied for disability but has not heard an answer from them yet.  She is afraid she is going to lose her housing soon as she has not been able to work.  Fixated on getting into a facility where she can be cared for.  States the Education officer, museum at [employer] told her she could be placed somewhere for care.  She was given 25 mg of hydroxyzine and was able to stop pacing the halls and fall asleep.
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
## 2014-04-05 NOTE — ED Notes (Signed)
Patient transferred to [facility] with [employer].  Report called to Manuela Schwartz, RN.  Left the unit ambulatory with transport.  All belongings given to [place].

## 2014-04-05 NOTE — BHH Counselor (Signed)
Per Dr. Lovena Le and NP Nilda Simmer the Pt meets inpatient criteria for [facility] 300 hall bed. There are no 300 [facility] beds at this time. TTS will seek placement.   Latoya Green, [facility] Triage Specialist

## 2014-04-05 NOTE — BH Assessment (Signed)
Writer informed TTS ([place]) of the consult.  

## 2014-04-05 NOTE — ED Notes (Signed)
Security wanded pt and belongings. One bag of personal belongings secured at nurses station

## 2014-04-05 NOTE — BH Assessment (Signed)
[facility] by Dr. Lovena Le and Earleen Newport, NP. Patient's room assignment is 307-1. Nursing report # [phone]. Support paperwork completed.

## 2014-04-05 NOTE — BHH Counselor (Signed)
Counselor completed assessment.

## 2014-04-05 NOTE — ED Provider Notes (Signed)
CSN: [id number]     Arrival date & time 04/05/14  1129 History   First MD Initiated Contact with Patient 04/05/14 1206     Chief Complaint  Patient presents with  . Suicidal     (Consider location/radiation/quality/duration/timing/severity/associated sxs/prior Treatment) HPI Patient reports that she is suicidal and that she needs to be hospitalized. She reports that this is been going on for a while and she just has not had time to treat herself. She reports as been getting much worse and now she thinks about hurting herself frequently in all different kinds of ways. She reports that she has not done anything to harm her self recently. She reports in the past she has overdosed on pills. She is reporting hearing voices telling her that she is worthless and the pupil the like her.  Past Medical History  Diagnosis Date  . Epilepsy   . Cancer   . Depression    Past Surgical History  Procedure Laterality Date  . Skin ca removed     History reviewed. No pertinent family history. History  Substance Use Topics  . Smoking status: Current Every Day Smoker -- 1.00 packs/day    Types: Cigarettes  . Smokeless tobacco: Not on file  . Alcohol Use: No   OB History   Grav Para Term Preterm Abortions TAB SAB Ect Mult Living                 Review of Systems  10 Systems reviewed and are negative for acute change except as noted in the HPI.   Allergies  Keflex and Penicillins  Home Medications   Prior to Admission medications   Medication Sig Start Date End Date Taking? Authorizing Provider  carbamazepine (TEGRETOL XR) 200 MG 12 hr tablet Take 1 tablet (200 mg total) by mouth 2 (two) times daily. For mood stability 03/07/14  Yes Freda Munro May Agustin, NP  hydrocortisone cream 1 % Apply 1 application topically daily. For skin rash.  Continue to use current supply from home 03/07/14  Yes [facility], NP  hydrOXYzine (ATARAX/VISTARIL) 25 MG tablet Take 1 tablet (25 mg total) by mouth  every 6 (six) hours as needed for anxiety. 03/07/14  Yes Freda Munro May Agustin, NP  OVER THE COUNTER MEDICATION Take 1 tablet by mouth every 6 (six) hours as needed (diahrea.).   Yes Historical Provider, MD  sertraline (ZOLOFT) 100 MG tablet Take 100 mg by mouth daily.   Yes Historical Provider, MD  sertraline (ZOLOFT) 50 MG tablet Take 50 mg by mouth daily.   Yes Historical Provider, MD  ARIPiprazole (ABILIFY) 15 MG tablet Take 1 tablet (15 mg total) by mouth at bedtime. For mood stability 03/07/14   Freda Munro May Agustin, NP  ARIPiprazole (ABILIFY) 15 MG tablet Take 1 tablet (15 mg total) by mouth at bedtime. 04/11/14   [place], NP  carbamazepine (TEGRETOL XR) 200 MG 12 hr tablet Take 1 tablet (200 mg total) by mouth 2 (two) times daily. 04/11/14   Janett Labella, NP  hydrOXYzine (ATARAX/VISTARIL) 25 MG tablet Take 1 tablet (25 mg total) by mouth every 6 (six) hours as needed for anxiety. 04/11/14   [place], NP  sertraline (ZOLOFT) 50 MG tablet Take 3 tablets (150 mg total) by mouth daily. 04/11/14   Janett Labella, NP  traZODone (DESYREL) 50 MG tablet Take 1 tablet (50 mg total) by mouth at bedtime and may repeat dose one time if needed. 04/11/14   Freda Munro May  Agustin, NP   BP 125/85  Pulse 98  Temp(Src) 98 F (36.7 C) (Oral)  Resp 17  SpO2 100% Physical Exam  Constitutional: She is oriented to person, place, and time. She appears well-developed and well-nourished.  The patient is nontoxic alert she is ambulatory in the emergency department. She has no respiratory distress. Her speech is pressured and she has an anxious appearance.  HENT:  Head: Normocephalic and atraumatic.  Eyes: EOM are normal. Pupils are equal, round, and reactive to light.  Neck: Neck supple.  Cardiovascular: Normal rate, regular rhythm, normal heart sounds and intact distal pulses.   Pulmonary/Chest: Effort normal and breath sounds normal.  Abdominal: Soft. Bowel sounds are normal. She exhibits  no distension. There is no tenderness.  Musculoskeletal: Normal range of motion. She exhibits no edema.  Neurological: She is alert and oriented to person, place, and time. She has normal strength. Coordination normal. GCS eye subscore is 4. GCS verbal subscore is 5. GCS motor subscore is 6.  Skin: Skin is warm, dry and intact.  The patient does have various small round eschar is consistent with picking behavior. These are on her face and her forearms. None of them appear secondarily infected.  Psychiatric:  The patient's speech is pressured, she is anxious. She is cooperative and appropriate for examination. Her movements are coordinated and purposeful.    ED Course  Procedures (including critical care time) Labs Review Labs Reviewed  CBC WITH DIFFERENTIAL - Abnormal; Notable for the following:    RBC 5.40 (*)    MCV 72.4 (*)    MCH 23.1 (*)    RDW 18.2 (*)    Neutrophils Relative % 82 (*)    All other components within normal limits  COMPREHENSIVE METABOLIC PANEL - Abnormal; Notable for the following:    Sodium 135 (*)    Glucose, Bld 121 (*)    Total Bilirubin <0.2 (*)    All other components within normal limits  CARBAMAZEPINE LEVEL, TOTAL - Abnormal; Notable for the following:    Carbamazepine Lvl 3.5 (*)    All other components within normal limits  URINE RAPID DRUG SCREEN (HOSP PERFORMED)  ETHANOL    Imaging Review No results found.   EKG Interpretation None      MDM   Final diagnoses:  Suicidal ideation  Depression    The patient has increased suicidal ideation and will be evaluated for inpatient treatment. She is medically cleared at this point time.    Charlesetta Shanks, MD 04/11/14 [phone]

## 2014-04-05 NOTE — ED Notes (Signed)
MD at bedside. 
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
## 2014-04-06 ENCOUNTER — Encounter (HOSPITAL_COMMUNITY): Payer: Self-pay | Admitting: Behavioral Health

## 2014-04-06 DIAGNOSIS — F259 Schizoaffective disorder, unspecified: Secondary | ICD-10-CM

## 2014-04-06 DIAGNOSIS — R45851 Suicidal ideations: Secondary | ICD-10-CM

## 2014-04-06 LAB — TSH: TSH: 1.87 u[IU]/mL (ref 0.350–4.500)

## 2014-04-06 MED ORDER — SERTRALINE HCL 50 MG PO TABS
150.0000 mg | ORAL_TABLET | Freq: Every day | ORAL | Status: DC
  Administered 2014-04-07 – 2014-04-11 (×5): 150 mg via ORAL
  Filled 2014-04-06 (×5): qty 3
  Filled 2014-04-06: qty 42
  Filled 2014-04-06 (×2): qty 3

## 2014-04-06 NOTE — Progress Notes (Signed)
Pt did not attend group this evening.  
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
## 2014-04-06 NOTE — Progress Notes (Signed)
Patient flat, anxious and depressed. Forwards minimal information at first however on 1:1 is more open. Disheveled in appearance, does not seem to have bathed recently. Her main concern this morning is diarrhea which she states she's had off and on for 2 months. She indicates her depression and hopelessness are both a 10/10, anxiety is a 5/10. She endorses passive SI and verbally contracts for safety. She states she has no purpose to live and that "it's either staying here or suicide. There's nothing else for me. I cared for my children and I worked but now I can't get a job with my face looking this way." Medicated per orders. Fluids encouraged due to risk of dehydration. Medicated with imodium 4mg  prn. Patient given support and encouragement. Hygiene items provided and patient encouraged to shower. She is refusing groups due to not feeling well. Denies AVH/HI and remains safe at this time. Will continue to monitor closely. Jamie Kato

## 2014-04-06 NOTE — Progress Notes (Signed)
D: Patient in the hallway on approach.  Patient states she had a better day today.  Patient still continues to states that voodoo was placed upon her and that is why her face has several scabs.  Writer does visualized patient picking at the scabs on her face.  Patient somatic and states the scabs never heal on her face.  Writer encouraged patient to wash her hands frequently and to keep hands away from her face.  Patient states she is passive SI but verbally contracts for safety.  Patient denies HI and denies AVH. A: Staff to monitor Q 15 mins for safety.  Encouragement and support offered.  Scheduled medications administered per orders.  Vistaril administered prn tonight for anxiety. R: Patient remains safe on the unit.  Patient did not attend group tonight.  Patient visible on the unit but not interacting with peers.  Patient taking administered medications

## 2014-04-06 NOTE — Progress Notes (Signed)
NUTRITION ASSESSMENT  Pt identified as at risk on the Malnutrition Screen Tool  INTERVENTION: 1. Encourage PO intake of 3 meals with snacks daily. 2. Supplements: none at this time  NUTRITION DIAGNOSIS: Unintentional weight loss related to sub-optimal intake as evidenced by pt report.   Goal: Pt to meet >/= 90% of their estimated nutrition needs.  Monitor:  PO intake  Assessment:  58 year old who presented voluntarily to the [facility] reporting suicidal thoughts to shoot herself or overdose on pills.  Pt refused to speak to RD, states that she is not feeling well. Per nurse tech, she did have breakfast this AM. Per H&P, pt c/o chronic diarrhea.  Pt has a history of wt loss but recent weight history documentation shows 7 lb weight gain since June.  Height: Ht Readings from Last 1 Encounters:  04/05/14 5\' 9"  (1.753 m)    Weight: Wt Readings from Last 1 Encounters:  04/05/14 179 lb (81.194 kg)    Weight Hx: Wt Readings from Last 10 Encounters:  04/05/14 179 lb (81.194 kg)  02/28/14 173 lb (78.472 kg)  12/29/13 172 lb (78.019 kg)  12/04/13 172 lb 7 oz (78.217 kg)    BMI:  Body mass index is 26.42 kg/(m^2). Pt meets criteria for overweight based on current BMI.  Estimated Nutritional Needs: Kcal: 25-30 kcal/kg Protein: > 1 gram protein/kg Fluid: 1 ml/kcal  Diet Order: General Pt is also offered choice of unit snacks mid-morning and mid-afternoon.  Pt is eating as desired.   Lab results and medications reviewed.   Clayton Bibles, MS, RD, LDN Pager: [phone] After Hours Pager: [phone]

## 2014-04-06 NOTE — BHH Suicide Risk Assessment (Signed)
[place] INPATIENT:  Family/Significant Other Suicide Prevention Education  Suicide Prevention Education:  Patient Refusal for Family/Significant Other Suicide Prevention Education: The patient Liviana Mills has refused to provide written consent for family/significant other to be provided Family/Significant Other Suicide Prevention Education during admission and/or prior to discharge.  Physician notified. SPE reviewed with patient, brochure given.  Emberli Ballester, Casimiro Needle 04/06/2014, 3:23 PM

## 2014-04-06 NOTE — Plan of Care (Signed)
Problem: Ineffective individual coping Goal: STG: Patient will remain free from self harm Outcome: Progressing Patient positive for passive SI however denies plan. Verbally contracts for safety. Has not engaged in self harm.  Problem: Diagnosis: Increased Risk For Suicide Attempt Goal: STG-Patient Will Attend All Groups On The Unit Outcome: Not Progressing Patient refusing groups this morning.
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
## 2014-04-06 NOTE — BHH Group Notes (Signed)
[facility] LCSW Aftercare Discharge Planning Group Note  04/06/2014  8:45 AM  Participation Quality: Did Not Attend. Patient in bed, states that she does not feel well this morning.  Tilden Fossa, MSW, [place] Worker [facility] [phone]
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
## 2014-04-07 NOTE — BHH Group Notes (Addendum)
The focus of this group is to educate the patient on the purpose and policies of crisis stabilization and provide a format to answer questions about their admission.  The group details unit policies and expectations of patients while admitted.  Patient did not attend 0900 nurse education orientation group this morning.  Patient stayed in bed.   

## 2014-04-07 NOTE — Plan of Care (Signed)
Problem: Alteration in mood Goal: STG-Patient reports thoughts of self-harm to staff Outcome: Progressing Pt verbally contracts for safety with RN.

## 2014-04-07 NOTE — BHH Group Notes (Signed)
[place] LCSW Group Therapy 04/07/2014  1:15 pm   Type of Therapy: Group Therapy Participation Level: Active  Participation Quality: Attentive, Sharing and Supportive  Affect: Depressed and Flat  Cognitive: Alert and Oriented  Insight: Developing/Improving and Engaged  Engagement in Therapy: Developing/Improving and Engaged  Modes of Intervention: Clarification, Confrontation, Discussion, Education, Exploration, Limit-setting, Orientation, Problem-solving, Rapport Building, Art therapist, Socialization and Support  Summary of Progress/Problems: The topic for group was balance in life. Today's group focused on defining balance in one's own words, identifying things that can knock one off balance, and exploring healthy ways to maintain balance in life. Group members were asked to provide an example of a time when they felt off balance, describe how they handled that situation,and process healthier ways to regain balance in the future. Group members were asked to share the most important tool for maintaining balance that they learned while at [facility] and how they plan to apply this method after discharge. Patient actively listened during group. When asked how she could find balance in her life, patient focused on her facial appearance. CSW's provided support and encouragement.  Tilden Fossa, MSW, [place] Worker [facility] [phone]

## 2014-04-07 NOTE — Progress Notes (Signed)
[place] Group Notes:  (Nursing/MHT/Case Management/Adjunct)  Date:  04/07/2014  Time: 2100 Type of Therapy:  wrap up group  Participation Level:  Did Not Attend  Participation Quality:  did not attend  Affect:  did not attend  Cognitive:  did not attend  Insight:  None  Engagement in Group:  did not attend  Modes of Intervention:  did not attend  Summary of Progress/Problems:Pt was notified that group was beginning but remained in her room.   Jacques Navy 04/07/2014, 11:28 PM

## 2014-04-07 NOTE — Progress Notes (Signed)
D: Patient is alert and oriented. Pts mood is depressed and affect is flat and sullen this am. Pt's mood improved throughout the day. Pt states she does not "belong" in group due to the "rash" on her face. Pt reports passive SI. Pt denies HI and AVH. Pt complains of indigestion this am. Pt states her goal for the day is to work on her "health and take medication and talk." A: Pt encouraged to go to group. PRN medication administered per providers orders for indigestion and diarrhea (See MAR). Medications administered per providers orders (See MAR). 15 minute checks completed per protocol for patient safety.  R: Pt declines interest in going to group this am but went to an afternoon group with encouragement. Pt verbally contracts for safety. Pt receptive and cooperative to nursing interventions.

## 2014-04-07 NOTE — Progress Notes (Signed)
[facility] MD Progress Note  04/07/2014 4:51 PM Latoya Green  MRN:  [id number] Subjective:  Latoya Green continues to ruminate that her face is ruined, that she cant take care of herself that she needs to be institutionalized. She states that the Abilify did work for her but that she cant afford it. States that her mood has been better today but that if she is feeling like this because of the Abilify she would like to have it D/C as she is not going to be able to get it once she is out of here on the other hand does not think she is going to be able to make it out there insisting she needs to be institutionalized. "I am going to die out there in a curb"  Diagnosis:   DSM5: Depressive Disorders:  Major Depressive Disorder - with Psychotic Features (296.24) Total Time spent with patient: 30 minutes  Axis I: Schizoaffective Disorder  ADL's:  Intact  Sleep: Poor  Appetite:  Poor   Psychiatric Specialty Exam: Physical Exam  Review of Systems  Constitutional: Positive for malaise/fatigue.  HENT: Negative.   Eyes: Negative.   Respiratory: Negative.   Cardiovascular: Negative.   Gastrointestinal: Negative.   Genitourinary: Negative.   Musculoskeletal: Negative.   Skin:       Lesions in his face  Neurological: Positive for weakness.  Endo/Heme/Allergies: Negative.   Psychiatric/Behavioral: Positive for depression. The patient is nervous/anxious and has insomnia.     Blood pressure 100/65, pulse 105, temperature 98.1 F (36.7 C), temperature source Oral, resp. rate 16, height 5\' 9"  (1.753 m), weight 81.194 kg (179 lb).Body mass index is 26.42 kg/(m^2).  General Appearance: Disheveled  Eye Sport and exercise psychologist::  Fair  Speech:  Clear and Coherent  Volume:  Decreased  Mood:  Anxious and Depressed  Affect:  Depressed and anxious  Thought Process:  Coherent and Goal Directed  Orientation:  Other:  place person  Thought Content:  Delusions and worries, concerns a sense of hopelessness helplessness   Suicidal Thoughts:  No  Homicidal Thoughts:  No  Memory:  Immediate;   Fair Recent;   Fair Remote;   Fair  Judgement:  Fair  Insight:  Lacking  Psychomotor Activity:  Restlessness  Concentration:  Fair  Recall:  [employer] of Knowledge:NA  Language: Fair  Akathisia:  No  Handed:    AIMS (if indicated):     Assets:  Desire for Improvement  Sleep:  Number of Hours: 4.25   Musculoskeletal: Strength & Muscle Tone: within normal limits Gait & Station: normal Patient leans: N/A  Current Medications: Current Facility-Administered Medications  Medication Dose Route Frequency Provider Last Rate Last Dose  . acetaminophen (TYLENOL) tablet 650 mg  650 mg Oral Q6H PRN Laverle Hobby, PA-C      . alum & mag hydroxide-simeth (MAALOX/MYLANTA) 200-200-20 MG/5ML suspension 30 mL  30 mL Oral Q4H PRN Laverle Hobby, PA-C   30 mL at 04/07/14 4098  . ARIPiprazole (ABILIFY) tablet 15 mg  15 mg Oral QHS Laverle Hobby, PA-C   15 mg at 04/06/14 2254  . carbamazepine (TEGRETOL XR) 12 hr tablet 200 mg  200 mg Oral BID Laverle Hobby, PA-C   200 mg at 04/07/14 0801  . hydrOXYzine (ATARAX/VISTARIL) tablet 25 mg  25 mg Oral Q6H PRN Laverle Hobby, PA-C   25 mg at 04/06/14 2254  . ibuprofen (ADVIL,MOTRIN) tablet 600 mg  600 mg Oral Q8H PRN Waylan Boga, NP      .  loperamide (IMODIUM) capsule 2 mg  2 mg Oral PRN Laverle Hobby, PA-C   4 mg at 04/07/14 1024  . magnesium hydroxide (MILK OF MAGNESIA) suspension 30 mL  30 mL Oral Daily PRN Laverle Hobby, PA-C      . sertraline (ZOLOFT) tablet 150 mg  150 mg Oral Daily Neita Garnet, MD   150 mg at 04/07/14 0801  . traZODone (DESYREL) tablet 50 mg  50 mg Oral QHS,MR X 1 Laverle Hobby, PA-C   50 mg at 04/05/14 2130    Lab Results:  Results for orders placed during the hospital encounter of 04/05/14 (from the past 48 hour(s))  TSH     Status: None   Collection Time    04/06/14  6:15 AM      Result Value Ref Range   TSH 1.870  0.350 - 4.500 uIU/mL    Comment: Performed at [facility]    Physical Findings: AIMS:  , ,  ,  ,    CIWA:    COWS:     Treatment Plan Summary: Daily contact with patient to assess and evaluate symptoms and progress in treatment Medication management  Plan: Supportive approach/coping skills           CBT;mindfulness            Challenge the thought  Disorder           Pursue the Abilify and explore ways by which she can afford it           [facility] states that the Celexa worked well for her and wanted to give it a try but did not realized she           had to comme off the Zoloft Medical Decision Making Problem Points:  Review of psycho-social stressors (1) Data Points:  Review of medication regiment & side effects (2)  I certify that inpatient services furnished can reasonably be expected to improve the patient's condition.   [name] 04/07/2014, 4:51 PM

## 2014-04-08 MED ORDER — ARIPIPRAZOLE ER 400 MG IM SUSR
400.0000 mg | INTRAMUSCULAR | Status: DC
  Administered 2014-04-08: 400 mg via INTRAMUSCULAR

## 2014-04-08 NOTE — BHH Group Notes (Signed)
[place] LCSW Group Therapy  Feelings Around Relapse 1:15 - 2:30 PM  04/08/2014 3:24 PM  Type of Therapy:  Group Therapy  Participation Level:  Did Not Attend - patient in bed.    Concha Pyo 04/08/2014, 3:24 PM

## 2014-04-08 NOTE — Clinical Social Work Note (Signed)
CSW has offered to contact patient's aunt for collateral information and to provide updates on discharge plans, patient continues to decline at this time.  Tilden Fossa, MSW, [place] Worker [facility] [phone]

## 2014-04-08 NOTE — Progress Notes (Signed)
D) Pt's affect is sad and mood depressed. Pt states, "the rash on my face is due to stress and nothing more. Affect is flat and sad. Rates her depression at a 5, hopelessness at a 6 and her anxiety at a 5. Provided with a 1:1. Pt was to get an injection today wand verbalized fear about getting this. Talked with Pt in a 1:1 about decreasing her anxiety and working with this Probation officer so it would not be a frightening experience for her A) Provided with a 1:1. Given support, reassurance and praise along with encouragement.  R) Denies SI  And HI.

## 2014-04-08 NOTE — Clinical Social Work Note (Signed)
CSW contacted [facility] to check on status of ACTT referral, left message and awaiting return call.  Tilden Fossa, MSW, [place] Worker [facility] [phone]

## 2014-04-08 NOTE — Progress Notes (Signed)
D. Pt has been up and has been visible in milieu this evening. Pt has spoken about her face but feels it does not look as bad tonight and feels that she is not as stressed tonight and feels that being stressed on the outside had a negative impact on her face. Pt has endorsed depression and anxiety and does appear depressed while on the unit. Pt did receive medications but did not take and trazodone as she stated that she did not like it. A. Support and encouragement provided, medication education provided. R. Safety maintained, will continue to monitor.

## 2014-04-08 NOTE — Progress Notes (Signed)
[facility] MD Progress Note  04/08/2014 2:25 PM Latoya Green  MRN:  [id number] Subjective:  Latoya Green is talking about going back to work. Seems that she is coming around improved from admission ( cant do anything, have to be taken care of, institutionalized) . She is still worried about her ability to afford the Abilify. She was informed that she could get patient assistance for Mantena ( Abilify IM long acting) Initially hesitant as she does not like injections became more receptive after being informed that it is once a month, and that she will be able to get it for free.  Diagnosis:   DSM5: Depressive Disorders:  Major Depressive Disorder - with Psychotic Features (296.24) Total Time spent with patient: 30 minutes  Axis I: Schizoaffective Disorder  ADL's:  Intact  Sleep: Fair  Appetite:  Fair Psychiatric Specialty Exam: Physical Exam  Review of Systems  Constitutional: Negative.   HENT: Negative.   Eyes: Negative.   Respiratory: Negative.   Cardiovascular: Negative.   Gastrointestinal: Negative.   Genitourinary: Negative.   Musculoskeletal: Negative.   Skin: Negative.   Neurological: Negative.   Endo/Heme/Allergies: Negative.   Psychiatric/Behavioral: Positive for depression. The patient is nervous/anxious.     Blood pressure 127/78, pulse 89, temperature 97.8 F (36.6 C), temperature source Oral, resp. rate 24, height 5\' 9"  (1.753 m), weight 81.194 kg (179 lb).Body mass index is 26.42 kg/(m^2).  General Appearance: Fairly Groomed  Engineer, water::  Fair  Speech:  Clear and Coherent  Volume:  Normal  Mood:  Anxious, Worried  Affect:  anxious, worried  Thought Process:  Coherent and Goal Directed  Orientation:  Full (Time, Place, and Person)  Thought Content:  symptoms worries concerns  Suicidal Thoughts:  No  Homicidal Thoughts:  No  Memory:  Immediate;   Fair Recent;   Fair Remote;   Fair  Judgement:  Fair  Insight:  Present and Shallow  Psychomotor Activity:   Restlessness  Concentration:  Fair  Recall:  [employer] of Knowledge:NA  Language: Fair  Akathisia:  No  Handed:    AIMS (if indicated):     Assets:  Desire for Improvement  Sleep:  Number of Hours: 5.25   Musculoskeletal: Strength & Muscle Tone: within normal limits Gait & Station: normal Patient leans: N/A  Current Medications: Current Facility-Administered Medications  Medication Dose Route Frequency Provider Last Rate Last Dose  . acetaminophen (TYLENOL) tablet 650 mg  650 mg Oral Q6H PRN Laverle Hobby, PA-C      . alum & mag hydroxide-simeth (MAALOX/MYLANTA) 200-200-20 MG/5ML suspension 30 mL  30 mL Oral Q4H PRN Laverle Hobby, PA-C   30 mL at 04/07/14 6812  . ARIPiprazole (ABILIFY) tablet 15 mg  15 mg Oral QHS Laverle Hobby, PA-C   15 mg at 04/07/14 2139  . ARIPiprazole SUSR 400 mg  400 mg Intramuscular Q28 days Nicholaus Bloom, MD      . carbamazepine (TEGRETOL XR) 12 hr tablet 200 mg  200 mg Oral BID Laverle Hobby, PA-C   200 mg at 04/08/14 0850  . hydrOXYzine (ATARAX/VISTARIL) tablet 25 mg  25 mg Oral Q6H PRN Laverle Hobby, PA-C   25 mg at 04/07/14 2354  . ibuprofen (ADVIL,MOTRIN) tablet 600 mg  600 mg Oral Q8H PRN Waylan Boga, NP      . loperamide (IMODIUM) capsule 2 mg  2 mg Oral PRN Laverle Hobby, PA-C   4 mg at 04/07/14 1024  . magnesium hydroxide (MILK  OF MAGNESIA) suspension 30 mL  30 mL Oral Daily PRN Laverle Hobby, PA-C      . sertraline (ZOLOFT) tablet 150 mg  150 mg Oral Daily Neita Garnet, MD   150 mg at 04/08/14 0850  . traZODone (DESYREL) tablet 50 mg  50 mg Oral QHS,MR X 1 Laverle Hobby, PA-C   50 mg at 04/05/14 2130    Lab Results: No results found for this or any previous visit (from the past 48 hour(s)).  Physical Findings: AIMS: Facial and Oral Movements Muscles of Facial Expression: None, normal Lips and Perioral Area: None, normal Jaw: None, normal Tongue: None, normal,Extremity Movements Upper (arms, wrists, hands, fingers): None,  normal Lower (legs, knees, ankles, toes): None, normal, Trunk Movements Neck, shoulders, hips: None, normal, Overall Severity Severity of abnormal movements (highest score from questions above): None, normal Incapacitation due to abnormal movements: None, normal Patient's awareness of abnormal movements (rate only patient's report): No Awareness, Dental Status Current problems with teeth and/or dentures?: No Does patient usually wear dentures?: No  CIWA:    COWS:     Treatment Plan Summary: Daily contact with patient to assess and evaluate symptoms and progress in treatment Medication management  Plan: Supportive approach/coping skills           Improve reality testing           Mantena 400 mg IM today and Q 4 weeks  Medical Decision Making Problem Points:  Review of psycho-social stressors (1) Data Points:  Review of medication regiment & side effects (2)  I certify that inpatient services furnished can reasonably be expected to improve the patient's condition.   Latoya Green A 04/08/2014, 2:25 PM

## 2014-04-08 NOTE — Progress Notes (Signed)
[place] Group Notes:  (Nursing/MHT/Case Management/Adjunct)  Date:  04/08/2014  Time:  1:05 PM  Type of Therapy:  Therapeutic Activity  Participation Level: Did not attend   Clint Bolder 04/08/2014, 1:05 PM

## 2014-04-08 NOTE — BHH Group Notes (Signed)
Adult Psychoeducational Group Note  Date:  04/08/2014 Time:  11:37 PM  Group Topic/Focus:  AA Meeting  Participation Level:  Did Not Attend  Participation Quality:  None  Affect:  None  Cognitive:  None  Insight: None  Engagement in Group:  None  Modes of Intervention:  Discussion and Education  Additional Comments:  Shanice did not attend group.  [name] A 04/08/2014, 11:37 PM

## 2014-04-08 NOTE — BHH Group Notes (Signed)
   [facility] LCSW Aftercare Discharge Planning Group Note  04/08/2014  8:45 AM   Participation Quality: Alert, Appropriate and Oriented  Mood/Affect: Depressed and Flat  Depression Rating: 6  Anxiety Rating: 5  Thoughts of Suicide: Pt denies SI/HI  Will you contract for safety? Yes  Current AVH: Pt denies  Plan for Discharge/Comments: Pt attended discharge planning group and actively participated in group. CSW provided pt with today's workbook. Patient reports that her "physical symptoms" are improving. She plans to return home to follow up with [facility] at discharge.  Transportation Means: Pt reports access to transportation  Supports: Patient's aunt has been identified as a support.   Tilden Fossa, MSW, [place] Worker [facility] [phone]

## 2014-04-08 NOTE — Tx Team (Signed)
Interdisciplinary Treatment Plan Update (Adult) Date: 04/08/2014   Time Reviewed: 9:30 AM  Progress in Treatment: Attending groups: Yes Participating in groups: Yes Taking medication as prescribed: Yes Tolerating medication: Yes Family/Significant other contact made: No, Patient has declined for CSW to make collateral contact at this time Patient understands diagnosis: Yes Discussing patient identified problems/goals with staff: Yes Medical problems stabilized or resolved: Yes Denies suicidal/homicidal ideation: treatment team continuing to assess Issues/concerns per patient self-inventory: Yes Other:  New problem(s) identified: N/A  Discharge Plan or Barriers: Patient to return home to follow up with [facility] for outpatient services.   Reason for Continuation of Hospitalization:  Depression Anxiety Medication Stabilization   Comments: N/A  Estimated length of stay: 3-5 days  For review of initial/current patient goals, please see plan of care. Latoya Green is a 58 years old female admitted with Major Depression Disorder with psychotic symptoms. She will benefit from crisis stabilization, evaluation for medication, psycho-education groups for coping skills development, group therapy and case management for discharge planning.     Attendees: Patient:    Family:    Physician: Dr. Parke Poisson; Dr. Sabra Heck 04/08/2014 9:30 AM  Nursing: Vira Browns; Drake Leach; Para March, RN 04/08/2014 9:30 AM  Clinical Social Worker: Erasmo Downer Jerik Falletta,  [place] 04/08/2014 9:30 AM  Other: Joette Catching, LCSW 04/08/2014 9:30 AM  Other: Lucinda Dell, [place] Liaison 04/08/2014 9:30 AM  Other: Norberto Sorenson, P4CC  04/08/2014 9:30 AM  Other: Edwyna Shell, LCSW 04/08/2014 9:30 AM  Other:    Other:    Other:    Other:    Other:      Scribe for Treatment Team:  Tilden Fossa, MSW, [place]

## 2014-04-09 DIAGNOSIS — F259 Schizoaffective disorder, unspecified: Secondary | ICD-10-CM

## 2014-04-09 NOTE — Progress Notes (Signed)
Patient ID: Latoya Green, female   DOB: 1955/09/10, 58 y.o.   MRN: [id number] [facility] MD Progress Note  04/09/2014 11:45 AM Khalis Hittle  MRN:  [id number] Subjective:   Today, patient was seen in her room.  She remained calm but stated that her throat is bothering her and that was the reason for not attending  Group.  Patient stated that she is happy that her disabled son moved in to a group home.  Patient stated that she can now take good care of herself.  She stated that she is doing well and sleeping much better since she came to the hospital.  She is planning to go back to work soon.  Her concern is her being able to afford her Abilify injection after discharge.  She plans to be seeing a Teacher, music at [facility] when she is discharged.  She denied SI/HI/AVH.  Discharge plan is in progress. Diagnosis:   DSM5: Depressive Disorders:  Major Depressive Disorder - with Psychotic Features (296.24) Total Time spent with patient: 30 minutes  Axis I: Schizoaffective Disorder  ADL's:  Intact  Sleep: Fair  Appetite:  Fair Psychiatric Specialty Exam: Physical Exam  Review of Systems  Constitutional: Negative.   HENT: Negative.   Eyes: Negative.   Respiratory: Negative.   Cardiovascular: Negative.   Gastrointestinal: Negative.   Genitourinary: Negative.   Musculoskeletal: Negative.   Skin: Negative.   Neurological: Negative.   Endo/Heme/Allergies: Negative.   Psychiatric/Behavioral: Positive for depression. The patient is nervous/anxious.     Blood pressure 114/76, pulse 84, temperature 97.5 F (36.4 C), temperature source Oral, resp. rate 24, height 5\' 9"  (1.753 m), weight 81.194 kg (179 lb).Body mass index is 26.42 kg/(m^2).  General Appearance: Fairly Groomed  Engineer, water::  Fair  Speech:  Clear and Coherent  Volume:  Normal  Mood:  Anxious, Worried  Affect:  anxious, worried  Thought Process:  Coherent and Goal Directed  Orientation:  Full (Time, Place, and Person)  Thought Content:   symptoms worries concerns  Suicidal Thoughts:  No  Homicidal Thoughts:  No  Memory:  Immediate;   Fair Recent;   Fair Remote;   Fair  Judgement:  Fair  Insight:  Present and Shallow  Psychomotor Activity:  Restlessness  Concentration:  Fair  Recall:  [employer] of Knowledge:NA  Language: Fair  Akathisia:  No  Handed:    AIMS (if indicated):     Assets:  Desire for Improvement  Sleep:  Number of Hours: 5   Musculoskeletal: Strength & Muscle Tone: within normal limits Gait & Station: normal Patient leans: N/A  Current Medications: Current Facility-Administered Medications  Medication Dose Route Frequency Provider Last Rate Last Dose  . acetaminophen (TYLENOL) tablet 650 mg  650 mg Oral Q6H PRN Laverle Hobby, PA-C      . alum & mag hydroxide-simeth (MAALOX/MYLANTA) 200-200-20 MG/5ML suspension 30 mL  30 mL Oral Q4H PRN Laverle Hobby, PA-C   30 mL at 04/07/14 7124  . ARIPiprazole (ABILIFY) tablet 15 mg  15 mg Oral QHS Laverle Hobby, PA-C   15 mg at 04/08/14 2150  . ARIPiprazole SUSR 400 mg  400 mg Intramuscular Q28 days Nicholaus Bloom, MD   400 mg at 04/08/14 1456  . carbamazepine (TEGRETOL XR) 12 hr tablet 200 mg  200 mg Oral BID Laverle Hobby, PA-C   200 mg at 04/09/14 5809  . hydrOXYzine (ATARAX/VISTARIL) tablet 25 mg  25 mg Oral Q6H PRN Laverle Hobby, PA-C  25 mg at 04/08/14 2257  . ibuprofen (ADVIL,MOTRIN) tablet 600 mg  600 mg Oral Q8H PRN Waylan Boga, NP      . loperamide (IMODIUM) capsule 2 mg  2 mg Oral PRN Laverle Hobby, PA-C   4 mg at 04/07/14 1024  . magnesium hydroxide (MILK OF MAGNESIA) suspension 30 mL  30 mL Oral Daily PRN Laverle Hobby, PA-C      . sertraline (ZOLOFT) tablet 150 mg  150 mg Oral Daily Neita Garnet, MD   150 mg at 04/09/14 3662  . traZODone (DESYREL) tablet 50 mg  50 mg Oral QHS,MR X 1 Laverle Hobby, PA-C   50 mg at 04/05/14 2130    Lab Results: No results found for this or any previous visit (from the past 48  hour(s)).  Physical Findings: AIMS: Facial and Oral Movements Muscles of Facial Expression: None, normal Lips and Perioral Area: None, normal Jaw: None, normal Tongue: None, normal,Extremity Movements Upper (arms, wrists, hands, fingers): None, normal Lower (legs, knees, ankles, toes): None, normal, Trunk Movements Neck, shoulders, hips: None, normal, Overall Severity Severity of abnormal movements (highest score from questions above): None, normal Incapacitation due to abnormal movements: None, normal Patient's awareness of abnormal movements (rate only patient's report): No Awareness, Dental Status Current problems with teeth and/or dentures?: No Does patient usually wear dentures?: No  CIWA:    COWS:     Treatment Plan Summary: Daily contact with patient to assess and evaluate symptoms and progress in treatment Medication management  Plan: Continue with plan of care Continue crisis management Encourage to participate in group and individual sessions Continue medication management/ and review as needed Continue taking Sertraline 150 mg by mouth daily for depression Trazodone 50 mg by mouth at bed time for sleep, May repeat x1 Aripiprazole 15 mg by mouth at bed time for Psychosis/mood Hyroxyzine 25 mg by mouth every 6 hrs as needed for anxiety Discharge  Plan in progress Address health issues /V/S as needed    Medical Decision Making Problem Points:  Review of psycho-social stressors (1) Data Points:  Review of medication regiment & side effects (2)  I certify that inpatient services furnished can reasonably be expected to improve the patient's condition.   Charmaine Downs, C   PMHNP-BC 04/09/2014, 11:45 AM I agreed with findings and treatment plan of this patient

## 2014-04-09 NOTE — Progress Notes (Signed)
D) Pt has been attending the groups and interacting with his peers. Affect is flat and mood depressed. Pt rates her depression at a 5, hopelessness at a 5 and her anxiety at a 6. Pt requested that her keys be given to a family member who is getting Pt a smaller apartment that she will be able to afford. Pt states that she feels good about this. States, "I will be able to visit my son from there". Denies SI and HI. A) Given support, reassurance and praise along with encouragement. Provided with a 1:1.  R) Pt's affect remains flat and mood depressed. States "I am worried about what might happen".

## 2014-04-09 NOTE — BHH Group Notes (Signed)
[place] Group Notes: (Clinical Social Work)   04/09/2014      Type of Therapy:  Group Therapy   Participation Level:  Did Not Attend - refused to leave her room   Selmer Dominion, LCSW 04/09/2014, 1:53 PM

## 2014-04-09 NOTE — Progress Notes (Signed)
D. Pt has been up and visible in milieu this evening, pt did not attend evening group activity. Pt reports that she is still feeling depressed and anxious and does appear depressed. Pt spoke about the rash on her face and spoke about how she feels it is improving and blamed stress for a big part in it. Pt did receive medication this evening. A. Support and encouragement provided. R. Safety maintained, will continue to monitor.

## 2014-04-09 NOTE — Progress Notes (Signed)
Patient did not attend the evening speaker AA meeting. Pt was notified that group was beginning but remained in her room.   

## 2014-04-10 DIAGNOSIS — F251 Schizoaffective disorder, depressive type: Secondary | ICD-10-CM

## 2014-04-10 DIAGNOSIS — F333 Major depressive disorder, recurrent, severe with psychotic symptoms: Principal | ICD-10-CM

## 2014-04-10 NOTE — Progress Notes (Signed)
D. Pt has been up and visible in milieu this evening, pt did not attend evening group activity. Pt reports that she is still feeling depressed and looks depressed in the milieu  Pt also endorsing anxiety while in the milieu, has received medications without incident. A. Support and encouragement provided. R. Safety maintained, will continue to monitor.

## 2014-04-10 NOTE — Plan of Care (Signed)
Problem: Ineffective individual coping Goal: STG: Patient will remain free from self harm Outcome: Progressing Pt. continues to be safe on Q 15 minutes checks without gestures of self harm.

## 2014-04-10 NOTE — Progress Notes (Signed)
Psychoeducational Group Note  Date:  04/10/2014 Time: 1015 Group Topic/Focus:  Making Healthy Choices:   The focus of this group is to help patients identify negative/unhealthy choices they were using prior to admission and identify positive/healthier coping strategies to replace them upon discharge.  Participation Level:  Active  Participation Quality:  Appropriate  Affect:  Appropriate  Cognitive:  Appropriate  Insight:  Engaged  Engagement in Group:  Engaged  Additional Comments:    Lauralyn Primes 6:06 PM. 04/10/2014

## 2014-04-10 NOTE — Progress Notes (Signed)
Patient ID: Latoya Green, female   DOB: Jul 28, 1955, 58 y.o.   MRN: [id number] [facility] MD Progress Note  04/10/2014 1:13 PM Latoya Green  MRN:  [id number] Subjective:     Patient voices concerns around discharge.  She reports that she needs to move by the end of the month and that her Aunt and Barbaraann Rondo are helping her financially and logistically.  Her disabled son moved in to a group home, she is happy that he is safe and well cared for but that has but a financially strain on her. She is planning to go back to work soon.    She worries about  being able to afford her Abilify injection after discharge.    She plans to be seeing a Teacher, music at [facility] when she is discharged.  She denied SI/HI/AVH.    Rates her Depression 2/10  Anxiety 3 /10 today    Diagnosis:   DSM5: Depressive Disorders:  Major Depressive Disorder - with Psychotic Features (296.24) Total Time spent with patient: 30 minutes  Axis I: Schizoaffective Disorder  ADL's:  Intact  Sleep: Good  Appetite:  GoodPsychiatric Specialty Exam: Physical Exam  Review of Systems  Constitutional: Negative.   HENT: Negative.   Eyes: Negative.   Respiratory: Negative.   Cardiovascular: Negative.   Gastrointestinal: Negative.   Genitourinary: Negative.   Musculoskeletal: Negative.   Skin: Negative.   Neurological: Negative.   Endo/Heme/Allergies: Negative.   Psychiatric/Behavioral: Positive for depression. The patient is nervous/anxious.     Blood pressure 119/77, pulse 87, temperature 97.4 F (36.3 C), temperature source Oral, resp. rate 18, height 5\' 9"  (1.753 m), weight 81.194 kg (179 lb).Body mass index is 26.42 kg/(m^2).  General Appearance: Fairly Groomed  Engineer, water::  Fair  Speech:  Clear and Coherent  Volume:  Normal  Mood:  Anxious, Worried about discharge  Affect:  anxious, worried  Thought Process:  Coherent and Goal Directed  Orientation:  Full (Time, Place, and Person)  Thought Content:  symptoms worries  concerns  Suicidal Thoughts:  No  Homicidal Thoughts:  No  Memory:  Immediate;   Fair Recent;   Fair Remote;   Fair  Judgement:  Fair  Insight:  Present and Shallow  Psychomotor Activity:  calm  Concentration:  Fair  Recall:  [employer] of Knowledge:NA  Language: Fair  Akathisia:  No  Handed:    AIMS (if indicated):     Assets:  Desire for Improvement  Sleep:  Number of Hours: 5.75   Musculoskeletal: Strength & Muscle Tone: within normal limits Gait & Station: normal Patient leans: N/A  Current Medications: Current Facility-Administered Medications  Medication Dose Route Frequency Provider Last Rate Last Dose  . acetaminophen (TYLENOL) tablet 650 mg  650 mg Oral Q6H PRN Laverle Hobby, PA-C      . alum & mag hydroxide-simeth (MAALOX/MYLANTA) 200-200-20 MG/5ML suspension 30 mL  30 mL Oral Q4H PRN Laverle Hobby, PA-C   30 mL at 04/07/14 9381  . ARIPiprazole (ABILIFY) tablet 15 mg  15 mg Oral QHS Laverle Hobby, PA-C   15 mg at 04/09/14 2156  . ARIPiprazole SUSR 400 mg  400 mg Intramuscular Q28 days Nicholaus Bloom, MD   400 mg at 04/08/14 1456  . carbamazepine (TEGRETOL XR) 12 hr tablet 200 mg  200 mg Oral BID Laverle Hobby, PA-C   200 mg at 04/10/14 8299  . hydrOXYzine (ATARAX/VISTARIL) tablet 25 mg  25 mg Oral Q6H PRN Laverle Hobby, PA-C  25 mg at 04/09/14 2255  . ibuprofen (ADVIL,MOTRIN) tablet 600 mg  600 mg Oral Q8H PRN Waylan Boga, NP      . loperamide (IMODIUM) capsule 2 mg  2 mg Oral PRN Laverle Hobby, PA-C   4 mg at 04/07/14 1024  . magnesium hydroxide (MILK OF MAGNESIA) suspension 30 mL  30 mL Oral Daily PRN Laverle Hobby, PA-C      . sertraline (ZOLOFT) tablet 150 mg  150 mg Oral Daily Neita Garnet, MD   150 mg at 04/10/14 [phone]  . traZODone (DESYREL) tablet 50 mg  50 mg Oral QHS,MR X 1 Laverle Hobby, PA-C   50 mg at 04/05/14 2130    Lab Results: No results found for this or any previous visit (from the past 48 hour(s)).  Physical Findings: AIMS:  Facial and Oral Movements Muscles of Facial Expression: None, normal Lips and Perioral Area: None, normal Jaw: None, normal Tongue: None, normal,Extremity Movements Upper (arms, wrists, hands, fingers): None, normal Lower (legs, knees, ankles, toes): None, normal, Trunk Movements Neck, shoulders, hips: None, normal, Overall Severity Severity of abnormal movements (highest score from questions above): None, normal Incapacitation due to abnormal movements: None, normal Patient's awareness of abnormal movements (rate only patient's report): No Awareness, Dental Status Current problems with teeth and/or dentures?: No Does patient usually wear dentures?: No  CIWA:    COWS:     Treatment Plan Summary: Daily contact with patient to assess and evaluate symptoms and progress in treatment Medication management  Plan: Continue with plan of care Continue crisis management Encourage to participate in group and individual sessions Continue medication management/ and review as needed -Continue Sertraline 150 mg by mouth daily for depression -Trazodone 50 mg by mouth at bed time for sleep, May repeat x1 -Aripiprazole 15 mg by mouth at bed time for Psychosis/mood -Tegretol XR 200 mg po bid -Hyroxyzine 25 mg by mouth every 6 hrs as needed for anxiety -NO c/o  side effects Discharge  Plan in progress Address health issues as needed    Medical Decision Making Problem Points:  Review of psycho-social stressors (1) Data Points:  Review of medication regiment & side effects (2)  I certify that inpatient services furnished can reasonably be expected to improve the patient's condition.   Wadie Lessen   PMHNP-BC 04/10/2014, 1:13 PM I agreed with findings and treatment plan of this patient
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
## 2014-04-10 NOTE — Progress Notes (Signed)
D) Pt. alert and oriented X 4. Presents with a flat affect but pleasant when approached. Pt. did not attended AM groups held by charge RN, as she was in bed. OOB for medications when prompted. Reported sleeping better last night. Contracts for safety while hospitalized. A) Med given as ordered. Emotional support and availability offered. Shift assessment done. Q 15 minutes checks maintained for suicide without gestures / event to note at this time. R) Pt. Responsive to care. Took her scheduled meds without issues. Verbalized her concern peacefully; of wanting to talk to Dr. Sabra Heck tomorrow about going home. She stated "I'm feeling better, want to go home, I have lots of stuff to do". Remains safe. No physical distress evident at this time.

## 2014-04-10 NOTE — Progress Notes (Signed)
Patient did not attend the evening speaker AA meeting. Pt was notified that group was beginning but remained in her room.   

## 2014-04-11 DIAGNOSIS — F332 Major depressive disorder, recurrent severe without psychotic features: Secondary | ICD-10-CM

## 2014-04-11 MED ORDER — TRAZODONE HCL 50 MG PO TABS: 50.0000 mg | ORAL_TABLET | Freq: Every evening | ORAL | Status: DC | PRN

## 2014-04-11 MED ORDER — CARBAMAZEPINE ER 200 MG PO TB12: 200.0000 mg | ORAL_TABLET | Freq: Two times a day (BID) | ORAL | Status: DC

## 2014-04-11 MED ORDER — HYDROXYZINE HCL 25 MG PO TABS: 25.0000 mg | ORAL_TABLET | Freq: Four times a day (QID) | ORAL | Status: DC | PRN

## 2014-04-11 MED ORDER — ARIPIPRAZOLE 15 MG PO TABS
15.0000 mg | ORAL_TABLET | Freq: Every day | ORAL | Status: DC
Start: 2014-04-11 — End: 2014-10-24

## 2014-04-11 MED ORDER — SERTRALINE HCL 50 MG PO TABS: 150.0000 mg | ORAL_TABLET | Freq: Every day | ORAL | Status: DC

## 2014-04-11 NOTE — Progress Notes (Signed)
Patient ID: Latoya Green, female   DOB: 11/12/1955, 58 y.o.   MRN: [id number] Pt did not attend the 10 am Psycho Education group

## 2014-04-11 NOTE — BHH Group Notes (Signed)
   [facility] LCSW Aftercare Discharge Planning Group Note  04/11/2014  8:45 AM   Participation Quality: Alert, Appropriate and Oriented  Mood/Affect: Appropriate  Depression Rating: 2  Anxiety Rating: 3  Thoughts of Suicide: Pt denies SI/HI  Will you contract for safety? Yes  Current AVH: Pt denies  Plan for Discharge/Comments: Pt attended discharge planning group and actively participated in group. CSW provided pt with today's workbook. Patient reports feeling "okay, ready to discharge" today. She plans to return home and follow up with [facility] for outpatient services.  Transportation Means: Pt reports access to transportation- reports that her aunt will pick her up  Supports: Patient identified her aunt as a support  Tilden Fossa, MSW, [place] Worker [employer] [phone]

## 2014-04-11 NOTE — Progress Notes (Signed)
D. Pt has been visible in milieu, however minimal interaction or participation in the milieu however does brighten upon approach and interaction. Pt focused on discharge soon as she spoke about how she needs to pack things up and needs to move by the end of the month. Pt reports that she is feeling better mentally now than when she came in, however pt does still appear withdrawn when not engaged with staff. A. Support and encouragement provided. R. Safety maintained, will continue to monitor.

## 2014-04-11 NOTE — Progress Notes (Signed)
Pt was discharged home today. She denied any S/I H/I or A/V hallucinations.   She was given f/u appointment, rx, sample medications,and hotline info booklet. She voiced understanding to all instructions provided.  She declined the need for smoking cessation materials.

## 2014-04-11 NOTE — BHH Group Notes (Signed)
[facility] LCSW Group Therapy 04/11/2014  1:15 PM   Type of Therapy: Group Therapy  Participation Level: Did Not Attend for unknown reason.   Tilden Fossa, MSW, [place] Worker [facility] [phone]

## 2014-04-11 NOTE — Progress Notes (Signed)
[facility] Adult Case Management Discharge Plan :  Will you be returning to the same living situation after discharge: Yes,  patient plans to return home At discharge, do you have transportation home?:Yes,  patient reports that her aunt will be picking her up Do you have the ability to pay for your medications:Yes,  patient will be provided with medication samples and prescriptions at discharge.  Release of information consent forms completed and in the chart;  Patient's signature needed at discharge.  Patient to Follow up at: Follow-up Information   Follow up with [facility]. (Please present to [facility] walk-in clinic between [place] to 3pm for hospital discharge appointment.)    Specialty:  [facility] information:   [place]  [id number] [phone]       Patient denies SI/HI:   Yes,  denies    Safety Planning and Suicide Prevention discussed:  Yes,  with patient  Breeann Reposa, Casimiro Needle 04/11/2014, 10:47 AM

## 2014-04-11 NOTE — BHH Suicide Risk Assessment (Signed)
Suicide Risk Assessment  Discharge Assessment     Demographic Factors:  Caucasian  Total Time spent with patient: 30 minutes  Psychiatric Specialty Exam:     Blood pressure 105/65, pulse 89, temperature 97.9 F (36.6 C), temperature source Oral, resp. rate 16, height 5\' 9"  (1.753 m), weight 81.194 kg (179 lb).Body mass index is 26.42 kg/(m^2).  General Appearance: Fairly Groomed  Engineer, water::  Fair  Speech:  Clear and Coherent  Volume:  Normal  Mood:  Euthymic  Affect:  Appropriate  Thought Process:  Coherent and Goal Directed  Orientation:  Full (Time, Place, and Person)  Thought Content:  plans as she moves on  Suicidal Thoughts:  No  Homicidal Thoughts:  No  Memory:  Immediate;   Fair Recent;   Fair Remote;   Fair  Judgement:  Fair  Insight:  Present  Psychomotor Activity:  Normal  Concentration:  Fair  Recall:  [employer] of Knowledge:NA  Language: Fair  Akathisia:  No  Handed:    AIMS (if indicated):     Assets:  Desire for Improvement Housing Social Support Vocational/Educational  Sleep:  Number of Hours: 5.75    Musculoskeletal: Strength & Muscle Tone: within normal limits Gait & Station: normal Patient leans: N/A   Mental Status Per Nursing Assessment::   On Admission:  Suicidal ideation indicated by patient;Self-harm thoughts;Suicide plan  Current Mental Status by Physician: In full contact with reality. There is no evidence of  active delusional ideas. Her mood is euthymic. She is looking forward of going back to work   Loss Factors: NA  Historical Factors: NA  Risk Reduction Factors:   Sense of responsibility to family, Employed, Living with another person, especially a relative and Positive social support  Continued Clinical Symptoms:  Depression:   Severe  Cognitive Features That Contribute To Risk:  Polarized thinking Thought constriction (tunnel vision)    Suicide Risk:  Minimal: No identifiable suicidal ideation.  Patients  presenting with no risk factors but with morbid ruminations; may be classified as minimal risk based on the severity of the depressive symptoms  Discharge Diagnoses:   AXIS I:  Major Depression Recurrent Severe with psychotic symptoms, R/O Schizoaffective Disorder AXIS II:  No diagnosis AXIS III:   Past Medical History  Diagnosis Date  . Epilepsy   . Cancer   . Depression    AXIS IV:  other psychosocial or environmental problems AXIS V:  61-70 mild symptoms  Plan Of Care/Follow-up recommendations:  Activity:  as tolerated Diet:  regular Follow up [facility] Is patient on multiple antipsychotic therapies at discharge:  No   Has Patient had three or more failed trials of antipsychotic monotherapy by history:  No  Recommended Plan for Multiple Antipsychotic Therapies: NA    Traxton Kolenda A 04/11/2014, 12:20 PM

## 2014-04-14 NOTE — Progress Notes (Signed)
Patient Discharge Instructions:  After Visit Summary (AVS):   Faxed to:  04/14/14 Discharge Summary Note:   Faxed to:  04/14/14 Psychiatric Admission Assessment Note:   Faxed to:  04/14/14 Suicide Risk Assessment - Discharge Assessment:   Faxed to:  04/14/14 Faxed/Sent to the Next Level Care provider:  04/14/14 Faxed to [facility] @ [place], 04/14/2014, 3:15 PM
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
## 2014-10-14 ENCOUNTER — Emergency Department (HOSPITAL_COMMUNITY)
Admission: EM | Admit: 2014-10-14 | Discharge: 2014-10-15 | Disposition: A | Discharge status: Other Acute Inpatient Hospital | Payer: Self-pay | Attending: Emergency Medicine | Admitting: Emergency Medicine

## 2014-10-14 DIAGNOSIS — Z79899 Other long term (current) drug therapy: Secondary | ICD-10-CM | POA: Insufficient documentation

## 2014-10-14 DIAGNOSIS — R000 Tachycardia, unspecified: Secondary | ICD-10-CM | POA: Insufficient documentation

## 2014-10-14 DIAGNOSIS — G40909 Epilepsy, unspecified, not intractable, without status epilepticus: Secondary | ICD-10-CM | POA: Insufficient documentation

## 2014-10-14 DIAGNOSIS — R45851 Suicidal ideations: Secondary | ICD-10-CM | POA: Insufficient documentation

## 2014-10-14 DIAGNOSIS — Z720 Tobacco use: Secondary | ICD-10-CM | POA: Insufficient documentation

## 2014-10-14 DIAGNOSIS — Z880 Allergy status to penicillin: Secondary | ICD-10-CM | POA: Insufficient documentation

## 2014-10-14 DIAGNOSIS — Z859 Personal history of malignant neoplasm, unspecified: Secondary | ICD-10-CM | POA: Insufficient documentation

## 2014-10-15 ENCOUNTER — Encounter (HOSPITAL_COMMUNITY): Payer: Self-pay | Admitting: Emergency Medicine

## 2014-10-15 ENCOUNTER — Inpatient Hospital Stay (HOSPITAL_COMMUNITY)
Admission: AD | Admit: 2014-10-15 | Discharge: 2014-10-24 | DRG: 885 | Disposition: A | Discharge status: Home / Self Care | Payer: Federal, State, Local not specified - Other | Source: Intra-hospital | Attending: Psychiatry | Admitting: Psychiatry

## 2014-10-15 ENCOUNTER — Encounter (HOSPITAL_COMMUNITY): Payer: Self-pay | Admitting: *Deleted

## 2014-10-15 DIAGNOSIS — F251 Schizoaffective disorder, depressive type: Secondary | ICD-10-CM | POA: Diagnosis present

## 2014-10-15 DIAGNOSIS — F333 Major depressive disorder, recurrent, severe with psychotic symptoms: Secondary | ICD-10-CM | POA: Diagnosis present

## 2014-10-15 DIAGNOSIS — Z79899 Other long term (current) drug therapy: Secondary | ICD-10-CM

## 2014-10-15 DIAGNOSIS — F259 Schizoaffective disorder, unspecified: Secondary | ICD-10-CM | POA: Diagnosis present

## 2014-10-15 DIAGNOSIS — F419 Anxiety disorder, unspecified: Secondary | ICD-10-CM | POA: Diagnosis not present

## 2014-10-15 DIAGNOSIS — R45851 Suicidal ideations: Secondary | ICD-10-CM | POA: Diagnosis present

## 2014-10-15 DIAGNOSIS — F17210 Nicotine dependence, cigarettes, uncomplicated: Secondary | ICD-10-CM | POA: Diagnosis present

## 2014-10-15 DIAGNOSIS — F329 Major depressive disorder, single episode, unspecified: Secondary | ICD-10-CM | POA: Diagnosis not present

## 2014-10-15 LAB — COMPREHENSIVE METABOLIC PANEL
ALBUMIN: 4 g/dL (ref 3.5–5.2)
ALT: 16 U/L (ref 0–35)
AST: 18 U/L (ref 0–37)
Alkaline Phosphatase: 96 U/L (ref 39–117)
Anion gap: 6 (ref 5–15)
BUN: 11 mg/dL (ref 6–23)
CALCIUM: 8.8 mg/dL (ref 8.4–10.5)
CO2: 21 mmol/L (ref 19–32)
Chloride: 108 mmol/L (ref 96–112)
Creatinine, Ser: 0.49 mg/dL — ABNORMAL LOW (ref 0.50–1.10)
GFR calc non Af Amer: >90 mL/min (ref >90)
Glucose, Bld: 108 mg/dL — ABNORMAL HIGH (ref 70–99)
POTASSIUM: 3.6 mmol/L (ref 3.5–5.1)
SODIUM: 135 mmol/L (ref 135–145)
Total Bilirubin: 0.4 mg/dL (ref 0.3–1.2)
Total Protein: 7.2 g/dL (ref 6.0–8.3)

## 2014-10-15 LAB — CBC
HEMATOCRIT: 42.4 % (ref 36.0–46.0)
Hemoglobin: 13.5 g/dL (ref 12.0–15.0)
MCH: 26.5 pg (ref 26.0–34.0)
MCHC: 31.8 g/dL (ref 30.0–36.0)
MCV: 83.3 fL (ref 78.0–100.0)
PLATELETS: 248 10*3/uL (ref 150–400)
RBC: 5.09 MIL/uL (ref 3.87–5.11)
RDW: 15.2 % (ref 11.5–15.5)
WBC: 8.6 10*3/uL (ref 4.0–10.5)

## 2014-10-15 LAB — RAPID URINE DRUG SCREEN, HOSP PERFORMED
AMPHETAMINES: NOT DETECTED
BARBITURATES: NOT DETECTED
Benzodiazepines: NOT DETECTED
Cocaine: NOT DETECTED
Opiates: NOT DETECTED
Tetrahydrocannabinol: NOT DETECTED

## 2014-10-15 LAB — ACETAMINOPHEN LEVEL: Acetaminophen (Tylenol), Serum: <10 ug/mL — ABNORMAL LOW (ref 10–30)

## 2014-10-15 LAB — ETHANOL: Alcohol, Ethyl (B): <5 mg/dL (ref 0–9)

## 2014-10-15 LAB — SALICYLATE LEVEL: Salicylate Lvl: <4 mg/dL (ref 2.8–20.0)

## 2014-10-15 MED ORDER — NICOTINE 21 MG/24HR TD PT24: 21.0000 mg | MEDICATED_PATCH | Freq: Every day | TRANSDERMAL | Status: DC

## 2014-10-15 MED ORDER — NICOTINE 21 MG/24HR TD PT24
21.0000 mg | MEDICATED_PATCH | Freq: Every day | TRANSDERMAL | Status: DC
  Administered 2014-10-15 – 2014-10-24 (×10): 21 mg via TRANSDERMAL
  Filled 2014-10-15 (×4): qty 1
  Filled 2014-10-15: qty 14
  Filled 2014-10-15 (×9): qty 1

## 2014-10-15 MED ORDER — ACETAMINOPHEN 325 MG PO TABS: 650.0000 mg | ORAL_TABLET | Freq: Four times a day (QID) | ORAL | Status: DC | PRN

## 2014-10-15 MED ORDER — ALUM & MAG HYDROXIDE-SIMETH 200-200-20 MG/5ML PO SUSP: 30.0000 mL | ORAL | Status: DC | PRN

## 2014-10-15 MED ORDER — MAGNESIUM HYDROXIDE 400 MG/5ML PO SUSP: 30.0000 mL | Freq: Every day | ORAL | Status: DC | PRN

## 2014-10-15 MED ORDER — ALUM & MAG HYDROXIDE-SIMETH 200-200-20 MG/5ML PO SUSP
30.0000 mL | ORAL | Status: DC | PRN
  Administered 2014-10-17 – 2014-10-24 (×5): 30 mL via ORAL
  Filled 2014-10-15 (×5): qty 30

## 2014-10-15 MED ORDER — SERTRALINE HCL 100 MG PO TABS
100.0000 mg | ORAL_TABLET | Freq: Every day | ORAL | Status: DC
  Administered 2014-10-15 – 2014-10-17 (×3): 100 mg via ORAL
  Filled 2014-10-15 (×6): qty 1

## 2014-10-15 MED ORDER — ONDANSETRON HCL 4 MG PO TABS: 4.0000 mg | ORAL_TABLET | Freq: Three times a day (TID) | ORAL | Status: DC | PRN

## 2014-10-15 MED ORDER — CARBAMAZEPINE ER 200 MG PO TB12
200.0000 mg | ORAL_TABLET | Freq: Two times a day (BID) | ORAL | Status: DC
  Administered 2014-10-15 – 2014-10-24 (×19): 200 mg via ORAL
  Filled 2014-10-15 (×2): qty 1
  Filled 2014-10-15: qty 28
  Filled 2014-10-15 (×9): qty 1
  Filled 2014-10-15: qty 28
  Filled 2014-10-15 (×11): qty 1

## 2014-10-15 MED ORDER — HYDROXYZINE HCL 25 MG PO TABS
25.0000 mg | ORAL_TABLET | Freq: Four times a day (QID) | ORAL | Status: DC | PRN
  Administered 2014-10-16 – 2014-10-19 (×8): 25 mg via ORAL
  Filled 2014-10-15 (×8): qty 1

## 2014-10-15 MED ORDER — LOPERAMIDE HCL 2 MG PO CAPS
2.0000 mg | ORAL_CAPSULE | ORAL | Status: DC | PRN
  Administered 2014-10-15 – 2014-10-24 (×2): 2 mg via ORAL
  Filled 2014-10-15: qty 1

## 2014-10-15 MED ORDER — LORAZEPAM 1 MG PO TABS
1.0000 mg | ORAL_TABLET | Freq: Three times a day (TID) | ORAL | Status: DC | PRN
Start: 2014-10-15 — End: 2014-10-15

## 2014-10-15 MED ORDER — ZOLPIDEM TARTRATE 5 MG PO TABS: 5.0000 mg | ORAL_TABLET | Freq: Every evening | ORAL | Status: DC | PRN

## 2014-10-15 MED ORDER — LOPERAMIDE HCL 2 MG PO CAPS
ORAL_CAPSULE | ORAL | Status: AC
  Filled 2014-10-15: qty 1

## 2014-10-15 MED ORDER — IBUPROFEN 200 MG PO TABS: 600.0000 mg | ORAL_TABLET | Freq: Three times a day (TID) | ORAL | Status: DC | PRN

## 2014-10-15 MED ORDER — DIPHENHYDRAMINE HCL 50 MG PO CAPS
50.0000 mg | ORAL_CAPSULE | Freq: Every day | ORAL | Status: DC
  Administered 2014-10-15 – 2014-10-18 (×4): 50 mg via ORAL
  Filled 2014-10-15 (×6): qty 1

## 2014-10-15 NOTE — ED Notes (Signed)
Attempted to call report to SAPU. RN unable to take reports at this time.

## 2014-10-15 NOTE — ED Notes (Signed)
Pt from home c/o Suicidal ideations with plans (overdose,cutting and jumping out of windows). She would like to go to [facility].

## 2014-10-15 NOTE — BH Assessment (Signed)
Informed Dr. Claudine Mouton of acceptance and he will complete discharge.    Lear Ng, [facility] Triage Specialist 10/15/2014 2:15 AM

## 2014-10-15 NOTE — Progress Notes (Signed)
D) Pt has been going up and down the hall and has not been attending the groups. Pt rate her depression at a 10, hopelessness at a 9 and her anxiety at a 3. Admits to thoughts of SI but contracts for her safety. Appears paranoid, yet Pt denies this. States that she feels she will never get better again. Verbalizes hopelessness and helplessness. A) Given support, and encouragement along with praise. Provided with a brief 1:1. Contract obtained from Pt for her safety. Spoke with Pt frequently throughout the shift. R) Pt has thoughts of SI because she feels she will never be back to herself. Is contracting for safety on the unit.

## 2014-10-15 NOTE — BH Assessment (Signed)
Pt's labs have resulted and pt can arrive anytime. Was unable to reach PA pr PT nurse.   Lear Ng, [facility] Triage Specialist 10/15/2014 2:07 AM

## 2014-10-15 NOTE — Progress Notes (Signed)
[place] Group Notes:  (Nursing/MHT/Case Management/Adjunct)  Date:  10/15/2014  Time: 2030 Type of Therapy:  wrap up group  Participation Level:  Active  Participation Quality:  Appropriate, Attentive, Sharing and Supportive  Affect:  Appropriate  Cognitive:  Appropriate  Insight:  Appropriate  Engagement in Group:  Engaged  Modes of Intervention:  Clarification, Education and Support  Summary of Progress/Problems: Pt shares the one good thing that happened to her today was that she got her Imodium on time. Pt reports at one time having a good life that she describes as normal and now she just hopes for a safe life, crime free.  Pt shares that she is grateful for her son.  Jacques Navy 10/15/2014, 9:26 PM

## 2014-10-15 NOTE — Progress Notes (Signed)
D.  Pt pleasant but flat on approach.  Pt has had complaint of diarrhea, which is ongoing for her and has not been attending groups due to this.  Pt is having minimal interaction on unit.  No complaints voiced this evening.  Passive SI but contracts for safety on unit.  A.  Support and encouragement offered  R.  Pt remains safe on unit, will continue to monitor.

## 2014-10-15 NOTE — [facility] Group Notes (Signed)
[facility] Group Notes: (Clinical Social Work)   10/15/2014      Type of Therapy:  Group Therapy   Participation Level:  Did Not Attend despite MHT prompting   Selmer Dominion, LCSW 10/15/2014, 2:54 PM

## 2014-10-15 NOTE — ED Notes (Signed)
Attempted  To call report to [facility]

## 2014-10-15 NOTE — BH Assessment (Addendum)
Pt presenting to ED with complaints of SI with plan to overdose, cut or jump from windows. Pt has history of multiple [facility] admissions in 2015 for Schizoaffective Disorder, depressed type. Per EDP note pt endorses SI with planning, and has been compliant with her medications. She is medically cleared for assessment.   Requested cart be placed with pt for assessment.   Assessment to commence shortly. Will attempt 0035 to allow time for placement of the cart.   First attempt 0038 did not connect. Will attempt again 0040.   Second attempt 0041 did not connect.    Contacted Art in ED and he will check to determine if cart has been placed.   0045 third attempt did not connect, cart continuously rings.   Lear Ng, [facility] Triage Specialist 10/15/2014 12:28 AM

## 2014-10-15 NOTE — ED Provider Notes (Signed)
CSN: [id number]     Arrival date & time 10/14/14  2233 History   First MD Initiated Contact with Patient 10/15/14 0007     Chief Complaint  Patient presents with  . Medical Clearance  . Suicidal     (Consider location/radiation/quality/duration/timing/severity/associated sxs/prior Treatment) The history is provided by the patient and medical records. No language interpreter was used.    Latoya Green is a 59 y.o. female  with a hx of epilepsy, depression with psychotic features presents to the Emergency Department complaining of gradual, persistent, progressively worsening suicidal ideation onset several weeks ago but worsening tonight.  Patient reports she made a plan to overdose, cut her wrist or jump out of a window. She requested admission to behavioral health. Patient reports that she had a suicide attempt 30 years ago but has not attempted since that time.  Records review shows that patient was admitted to behavioral health last in October 2015 for suicidal ideations and worsening depression. She reports that she is currently taking Zoloft and has been compliant with her other medications. Patient denies all somatic symptoms. She denies homicidal ideation, auditory and visual hallucinations. Nothing seems to make symptoms better or worse.  Past Medical History  Diagnosis Date  . Epilepsy   . Cancer   . Depression    Past Surgical History  Procedure Laterality Date  . Skin ca removed     No family history on file. History  Substance Use Topics  . Smoking status: Current Every Day Smoker -- 1.00 packs/day    Types: Cigarettes  . Smokeless tobacco: Not on file  . Alcohol Use: No   OB History    No data available     Review of Systems  Constitutional: Negative for fever, diaphoresis, appetite change, fatigue and unexpected weight change.  HENT: Negative for mouth sores.   Eyes: Negative for visual disturbance.  Respiratory: Negative for cough, chest tightness, shortness  of breath and wheezing.   Cardiovascular: Negative for chest pain.  Gastrointestinal: Negative for nausea, vomiting, abdominal pain, diarrhea and constipation.  Endocrine: Negative for polydipsia, polyphagia and polyuria.  Genitourinary: Negative for dysuria, urgency, frequency and hematuria.  Musculoskeletal: Negative for back pain and neck stiffness.  Skin: Negative for rash.  Allergic/Immunologic: Negative for immunocompromised state.  Neurological: Negative for syncope, light-headedness and headaches.  Hematological: Does not bruise/bleed easily.  Psychiatric/Behavioral: Positive for suicidal ideas and dysphoric mood. Negative for sleep disturbance. The patient is not nervous/anxious.       Allergies  Keflex and Penicillins  Home Medications   Prior to Admission medications   Medication Sig Start Date End Date Taking? Authorizing Provider  ARIPiprazole (ABILIFY) 15 MG tablet Take 1 tablet (15 mg total) by mouth at bedtime. 04/11/14   Kerrie Buffalo, NP  carbamazepine (TEGRETOL XR) 200 MG 12 hr tablet Take 1 tablet (200 mg total) by mouth 2 (two) times daily. 04/11/14   Kerrie Buffalo, NP  hydrOXYzine (ATARAX/VISTARIL) 25 MG tablet Take 1 tablet (25 mg total) by mouth every 6 (six) hours as needed for anxiety. 04/11/14   Kerrie Buffalo, NP  sertraline (ZOLOFT) 50 MG tablet Take 3 tablets (150 mg total) by mouth daily. 04/11/14   Kerrie Buffalo, NP  traZODone (DESYREL) 50 MG tablet Take 1 tablet (50 mg total) by mouth at bedtime and may repeat dose one time if needed. 04/11/14   Kerrie Buffalo, NP   BP 120/77 mmHg  Pulse 103  Temp(Src) 97.9 F (36.6 C) (Oral)  Resp 18  SpO2 95% Physical Exam  Constitutional: She appears well-developed and well-nourished. No distress.  Awake, alert, nontoxic appearance  HENT:  Head: Normocephalic and atraumatic.  Mouth/Throat: Oropharynx is clear and moist. No oropharyngeal exudate.  Eyes: Conjunctivae are normal. No scleral icterus.  Neck:  Normal range of motion. Neck supple.  Cardiovascular: Regular rhythm, normal heart sounds and intact distal pulses.  Tachycardia present.   Pulses:      Radial pulses are 2+ on the right side, and 2+ on the left side.       Dorsalis pedis pulses are 2+ on the right side, and 2+ on the left side.  Pulmonary/Chest: Effort normal and breath sounds normal. No respiratory distress. She has no wheezes.  Equal chest expansion  Abdominal: Soft. Bowel sounds are normal. She exhibits no mass. There is no tenderness. There is no rebound and no guarding.  Musculoskeletal: Normal range of motion. She exhibits no edema.  Neurological: She is alert.  Speech is clear and goal oriented Moves extremities without ataxia  Skin: Skin is warm and dry. She is not diaphoretic.  Psychiatric: Her mood appears anxious. She is not actively hallucinating. She exhibits a depressed mood. She expresses suicidal ideation. She expresses no homicidal ideation. She expresses suicidal plans. She expresses no homicidal plans.  Pt tearful  Nursing note and vitals reviewed.   ED Course  Procedures (including critical care time) Labs Review Labs Reviewed  CBC  ACETAMINOPHEN LEVEL  COMPREHENSIVE METABOLIC PANEL  ETHANOL  SALICYLATE LEVEL  URINE RAPID DRUG SCREEN (HOSP PERFORMED)  CARBAMAZEPINE LEVEL, TOTAL    Imaging Review No results found.   EKG Interpretation None      MDM   Final diagnoses:  None    Latoya Green presents for a long standing history of depression, anxiety and suicidal ideation. Patient with suicidal ideation tonight and plan of suicide. I believe patient will meet inpatient criteria. Labs pending. Will consult TTS.  1:24 AM Labs pending.  Pt is otherwise medically cleared.  Care transferred to Latoya Creamer, NP who will review labs.    BP 120/77 mmHg  Pulse 103  Temp(Src) 97.9 F (36.6 C) (Oral)  Resp 18  SpO2 95%   Latoya Butts, PA-C 10/15/14 0125  Latoya Balls,  MD 10/15/14 [phone]

## 2014-10-15 NOTE — Tx Team (Signed)
Initial Interdisciplinary Treatment Plan   PATIENT STRESSORS: Financial difficulties Health problems Loss of divorce from sick husband that had a heart attack Marital or family conflict Occupational concerns   PATIENT STRENGTHS: Active sense of humor Average or above average intelligence Capable of independent living Communication skills General fund of knowledge Work skills   PROBLEM LIST: Problem List/Patient Goals Date to be addressed Date deferred Reason deferred Estimated date of resolution  "Alternative living place if my aunt can't behave and pick me up" 10/15/14                 Suicidal Ideation 10/15/14     Depression 10/15/14                              DISCHARGE CRITERIA:  Ability to meet basic life and health needs Adequate post-discharge living arrangements Improved stabilization in mood, thinking, and/or behavior Medical problems require only outpatient monitoring Motivation to continue treatment in a less acute level of care Need for constant or close observation no longer present Reduction of life-threatening or endangering symptoms to within safe limits Safe-care adequate arrangements made Verbal commitment to aftercare and medication compliance  PRELIMINARY DISCHARGE PLAN: Outpatient therapy Participate in family therapy Placement in alternative living arrangements  PATIENT/FAMIILY INVOLVEMENT: This treatment plan has been presented to and reviewed with the patient, Latoya Green, and/or family member.  The patient and family have been given the opportunity to ask questions and make suggestions.  [name] 10/15/2014, 3:37 AM
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
## 2014-10-15 NOTE — BH Assessment (Signed)
Per Arlester Marker NP pt meets inpt criteria and can be accepted to [facility]. Per [facility] pt can go to room 403-1 once her clearance labs have resulted.    Informed EDP, and RN.   Lear Ng, [facility] Triage Specialist 10/15/2014 1:25 AM
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
## 2014-10-15 NOTE — ED Notes (Signed)
Security wanding patient. 
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
## 2014-10-15 NOTE — ED Notes (Signed)
This RN [employer] transport
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
## 2014-10-16 DIAGNOSIS — F329 Major depressive disorder, single episode, unspecified: Secondary | ICD-10-CM

## 2014-10-16 DIAGNOSIS — F419 Anxiety disorder, unspecified: Secondary | ICD-10-CM

## 2014-10-16 LAB — CARBAMAZEPINE LEVEL, TOTAL: Carbamazepine Lvl: 4.4 ug/mL (ref 4.0–12.0)

## 2014-10-16 NOTE — BHH Group Notes (Signed)
10/15/2013  Type of Therapy: Group Therapy   Participation Level: Did Not Attend despite MHT prompting   [place], [place] 10/16/2014, 1:15

## 2014-10-16 NOTE — Progress Notes (Signed)
Psychoeducational Group Note  Date:  10/16/2014 Time:  2030     Group Topic/Focus:  wrap up group  Participation Level: Did Not Attend  Participation Quality:  Not Applicable  Affect:  Not Applicable  Cognitive:  Not Applicable  Insight:  Not Applicable  Engagement in Group: Not Applicable  Additional Comments:  Pt was notified that group was beginning but remained in bed.   Jacques Navy 10/16/2014, 9:33 PM

## 2014-10-16 NOTE — Progress Notes (Signed)
D.  Pt pensive on approach, appears anxious.  Pt did not attend evening wrap up group, remained in room.  Pt then began pacing the hall.  Pt offered bedtime medication,and Vistaril for her anxiety.  Pt states that she will just take the Benadryl for now and see how it works before taking Vistaril.  Passive SI continues to be endorsed, and Pt does contract for safety on the unit.  A.  Support and encouragement offered  R.  Pt remains safe on the unit, will continue to monitor.

## 2014-10-16 NOTE — [facility] Suicide Risk Assessment (Signed)
[facility] INPATIENT:  Family/Significant Other Suicide Prevention Education  Suicide Prevention Education:  Patient Refusal for Family/Significant Other Suicide Prevention Education: The patient Latoya Green has refused to provide written consent for family/significant other to be provided Family/Significant Other Suicide Prevention Education during admission and/or prior to discharge.  Physician notified.  Lysle Dingwall 10/16/2014, 3:55 PM

## 2014-10-16 NOTE — Progress Notes (Signed)
D: Pt presents with flat affect and depressed mood. Pt rates depression 10/10. Pt reports passive suicidal thoughts and verbally contracts not to harm herself while here. Pt reported that if she's discharged, she can't promise that she will not act on those thoughts. Pt reports feeling overwhelmed due to not knowing where she will go once she is discharged home and that there are some issues with the rental home, that's preventing her from being able to move in. Pt stated that her and her aunt do not get along, which make it a difficult process for her. Pt denies AVH today. Pt appears disheveled in scrubs today. Pt compliant with taking meds and attending groups. Pt verbalized that she is tolerating her meds well. No adverse reactions to meds verbalized by pt at this time. A:Medications administered as ordered per MD. Verbal support given. Pt encouraged to attend groups. 15 minute checks performed for safety.  R: Pt receptive to treatment.

## 2014-10-16 NOTE — Progress Notes (Signed)
Adult Psychoeducational Group Note  Date:  10/16/2014 Time:  0930  Group Topic/Focus:  Coping With Mental Health Crisis:   The purpose of this group is to help patients identify strategies for coping with mental health crisis.  Group discusses possible causes of crisis and ways to manage them effectively.  Participation Level:  Active  Participation Quality:  Appropriate  Affect:  Appropriate  Cognitive:  Appropriate  Insight: Appropriate  Engagement in Group:  Engaged  Modes of Intervention:  Education  Additional Comments:    Alexsus Papadopoulos L 10/16/2014, 12:30 PM

## 2014-10-16 NOTE — Progress Notes (Signed)
[facility] MD Progress Note  10/16/2014 1:12 PM Latoya Green  MRN:  [id number] Subjective:  "II always feel anxious." Objective:  Latoya Green is an 59 y.o. female admitted to [facility] emergently and voluntarily from emergency department with increased symptoms of depression with suicidal ideation for the past week.  She states that she wishes she could do some work.  She has her phone but realizes that she cannot use it here.   Principal Problem: <principal problem not specified> Diagnosis:   Patient Active Problem List   Diagnosis Date Noted  . Schizoaffective disorder, depressive type [F25.1] 10/15/2014  . Schizoaffective disorder [F25.9] 04/06/2014  . MDD (major depressive disorder), recurrent, severe, with psychosis [F33.3] 04/05/2014  . Major depression [F32.2] 02/28/2014  . MDD (major depressive disorder), single episode, severe with psychotic features [F32.3] 12/29/2013   Total Time spent with patient: 30 minutes   Past Medical History:  Past Medical History  Diagnosis Date  . Depression   . Cancer     skin cancer 1997  . Epilepsy     1994    Past Surgical History  Procedure Laterality Date  . Skin ca removed     Family History: History reviewed. No pertinent family history. Social History:  History  Alcohol Use No     History  Drug Use No    History   Social History  . Marital Status: Divorced    Spouse Name: N/A  . Number of Children: N/A  . Years of Education: N/A   Social History Main Topics  . Smoking status: Current Every Day Smoker -- 1.00 packs/day for 25 years    Types: Cigarettes  . Smokeless tobacco: Not on file  . Alcohol Use: No  . Drug Use: No  . Sexual Activity: Not Currently    Birth Control/ Protection: Abstinence   Other Topics Concern  . None   Social History Narrative   Additional History:    Sleep: Good  Appetite:  Good   Assessment:   Musculoskeletal: Strength & Muscle Tone: within normal  limits Gait & Station: normal Patient leans: N/A   Psychiatric Specialty Exam: Physical Exam  Vitals reviewed. Psychiatric: Her mood appears anxious.    Review of Systems  Psychiatric/Behavioral: Positive for depression. The patient is nervous/anxious.   All other systems reviewed and are negative.   Blood pressure 124/75, pulse 93, temperature 97.7 F (36.5 C), temperature source Oral, resp. rate 16, height $RemoveBe'5\' 7"'[id number]$  (1.702 m), weight 79.379 kg (175 lb).Body mass index is 27.4 kg/(m^2).  General Appearance: Neat  Eye Contact::  Fair  Speech:  Normal Rate  Volume:  Normal  Mood:  Anxious  Affect:  Appropriate  Thought Process:  Coherent  Orientation:  Full (Time, Place, and Person)  Thought Content:  Rumination  Suicidal Thoughts:  No Passive  Homicidal Thoughts:  No  Memory:  Immediate;   Fair Recent;   Fair Remote;   Fair  Judgement:  Fair  Insight:  Fair  Psychomotor Activity:  Normal  Concentration:  Good  Recall:  Good  Fund of Knowledge:Good  Language: Good  Akathisia:  Negative  Handed:  Right  AIMS (if indicated):     Assets:  Desire for Improvement Resilience Social Support  ADL's:  Intact  Cognition: WNL  Sleep:  Number of Hours: 0     Current Medications: Current Facility-Administered Medications  Medication Dose Route Frequency Provider Last Rate Last Dose  . acetaminophen (TYLENOL) tablet 650 mg  650  mg Oral Q6H PRN Harriet Butte, NP      . alum & mag hydroxide-simeth (MAALOX/MYLANTA) 200-200-20 MG/5ML suspension 30 mL  30 mL Oral Q4H PRN Harriet Butte, NP      . carbamazepine (TEGRETOL XR) 12 hr tablet 200 mg  200 mg Oral BID Harriet Butte, NP   200 mg at 10/16/14 0754  . diphenhydrAMINE (BENADRYL) capsule 50 mg  50 mg Oral QHS Ambrose Finland, MD   50 mg at 10/15/14 2158  . hydrOXYzine (ATARAX/VISTARIL) tablet 25 mg  25 mg Oral Q6H PRN Harriet Butte, NP      . loperamide (IMODIUM) capsule 2 mg  2 mg Oral PRN Encarnacion Slates, NP   2 mg  at 10/15/14 1519  . magnesium hydroxide (MILK OF MAGNESIA) suspension 30 mL  30 mL Oral Daily PRN Harriet Butte, NP      . nicotine (NICODERM CQ - dosed in mg/24 hours) patch 21 mg  21 mg Transdermal Daily Jenne Campus, MD   21 mg at 10/16/14 0756  . sertraline (ZOLOFT) tablet 100 mg  100 mg Oral Daily Harriet Butte, NP   100 mg at 10/16/14 3154    Lab Results:  Results for orders placed or performed during the hospital encounter of 10/14/14 (from the past 48 hour(s))  Urine Drug Screen     Status: None   Collection Time: 10/15/14 12:35 AM  Result Value Ref Range   Opiates NONE DETECTED NONE DETECTED   Cocaine NONE DETECTED NONE DETECTED   Benzodiazepines NONE DETECTED NONE DETECTED   Amphetamines NONE DETECTED NONE DETECTED   Tetrahydrocannabinol NONE DETECTED NONE DETECTED   Barbiturates NONE DETECTED NONE DETECTED    Comment:        DRUG SCREEN FOR MEDICAL PURPOSES ONLY.  IF CONFIRMATION IS NEEDED FOR ANY PURPOSE, NOTIFY LAB WITHIN 5 DAYS.        LOWEST DETECTABLE LIMITS FOR URINE DRUG SCREEN Drug Class       Cutoff (ng/mL) Amphetamine      1000 Barbiturate      200 Benzodiazepine   008 Tricyclics       676 Opiates          300 Cocaine          300 THC              50   Acetaminophen level     Status: Abnormal   Collection Time: 10/15/14 12:38 AM  Result Value Ref Range   Acetaminophen (Tylenol), Serum <10.0 (L) 10 - 30 ug/mL    Comment:        THERAPEUTIC CONCENTRATIONS VARY SIGNIFICANTLY. A RANGE OF 10-30 ug/mL MAY BE AN EFFECTIVE CONCENTRATION FOR MANY PATIENTS. HOWEVER, SOME ARE BEST TREATED AT CONCENTRATIONS OUTSIDE THIS RANGE. ACETAMINOPHEN CONCENTRATIONS >150 ug/mL AT 4 HOURS AFTER INGESTION AND >50 ug/mL AT 12 HOURS AFTER INGESTION ARE OFTEN ASSOCIATED WITH TOXIC REACTIONS.   CBC     Status: None   Collection Time: 10/15/14 12:38 AM  Result Value Ref Range   WBC 8.6 4.0 - 10.5 K/uL   RBC 5.09 3.87 - 5.11 MIL/uL   Hemoglobin 13.5 12.0 - 15.0  g/dL   HCT 42.4 36.0 - 46.0 %   MCV 83.3 78.0 - 100.0 fL   MCH 26.5 26.0 - 34.0 pg   MCHC 31.8 30.0 - 36.0 g/dL   RDW 15.2 11.5 - 15.5 %   Platelets 248 150 - 400 K/uL  Comprehensive metabolic panel     Status: Abnormal   Collection Time: 10/15/14 12:38 AM  Result Value Ref Range   Sodium 135 135 - 145 mmol/L   Potassium 3.6 3.5 - 5.1 mmol/L   Chloride 108 96 - 112 mmol/L   CO2 21 19 - 32 mmol/L   Glucose, Bld 108 (H) 70 - 99 mg/dL   BUN 11 6 - 23 mg/dL   Creatinine, Ser 0.49 (L) 0.50 - 1.10 mg/dL   Calcium 8.8 8.4 - 10.5 mg/dL   Total Protein 7.2 6.0 - 8.3 g/dL   Albumin 4.0 3.5 - 5.2 g/dL   AST 18 0 - 37 U/L   ALT 16 0 - 35 U/L   Alkaline Phosphatase 96 39 - 117 U/L   Total Bilirubin 0.4 0.3 - 1.2 mg/dL   GFR calc non Af Amer >90 >90 mL/min   GFR calc Af Amer >90 >90 mL/min    Comment: (NOTE) The eGFR has been calculated using the CKD EPI equation. This calculation has not been validated in all clinical situations. eGFR's persistently <90 mL/min signify possible Chronic Kidney Disease.    Anion gap 6 5 - 15  Ethanol (ETOH)     Status: None   Collection Time: 10/15/14 12:38 AM  Result Value Ref Range   Alcohol, Ethyl (B) <5 0 - 9 mg/dL    Comment:        LOWEST DETECTABLE LIMIT FOR SERUM ALCOHOL IS 11 mg/dL FOR MEDICAL PURPOSES ONLY   Salicylate level     Status: None   Collection Time: 10/15/14 12:38 AM  Result Value Ref Range   Salicylate Lvl <6.9 2.8 - 20.0 mg/dL    Physical Findings: AIMS: Facial and Oral Movements Muscles of Facial Expression: None, normal Lips and Perioral Area: None, normal Jaw: None, normal Tongue: None, normal,Extremity Movements Upper (arms, wrists, hands, fingers): None, normal Lower (legs, knees, ankles, toes): None, normal, Trunk Movements Neck, shoulders, hips: None, normal, Overall Severity Severity of abnormal movements (highest score from questions above): None, normal Incapacitation due to abnormal movements: None,  normal Patient's awareness of abnormal movements (rate only patient's report): No Awareness, Dental Status Current problems with teeth and/or dentures?: No Does patient usually wear dentures?: No  CIWA:  CIWA-Ar Total: 2 COWS:     Treatment Plan Summary: Review of chart, vital signs, medications, and notes.  1-Individual and group therapy  2-Medication management for depression and anxiety: Medications reviewed with the patient and she stated no untoward effects, unchanged.  3-Coping skills for depression, anxiety  4-Continue crisis stabilization and management  5-Address health issues--monitoring vital signs, stable  6-Treatment plan in progress to prevent relapse of depression and anxiety Will get Tegretol level in the am.   Medical Decision Making:  Review of Psycho-Social Stressors (1), Discuss test with performing physician (1), Review and summation of old records (2) and Review of Medication Regimen & Side Effects (2)   Sheila May [place] AGNP-BC 10/16/2014, 1:12 PM  Reviewed the information documented and agree with the treatment plan.  Garison Genova,JANARDHAHA R. 10/16/2014 2:25 PM

## 2014-10-17 ENCOUNTER — Encounter (HOSPITAL_COMMUNITY): Payer: Self-pay | Admitting: Psychiatry

## 2014-10-17 MED ORDER — OLANZAPINE 5 MG PO TABS
5.0000 mg | ORAL_TABLET | Freq: Every day | ORAL | Status: DC
  Filled 2014-10-17 (×4): qty 1

## 2014-10-17 MED ORDER — SERTRALINE HCL 50 MG PO TABS
150.0000 mg | ORAL_TABLET | Freq: Every day | ORAL | Status: DC
  Administered 2014-10-18 – 2014-10-24 (×7): 150 mg via ORAL
  Filled 2014-10-17: qty 42
  Filled 2014-10-17 (×10): qty 3

## 2014-10-17 NOTE — Progress Notes (Signed)
Recreation Therapy Notes  Date: 05.02.2016 Time: 9:30am Location: [address] Group Room   Group Topic: Stress Management  Goal Area(s) Addresses:  Patient will actively participate in stress management techniques presented during session.   Behavioral Response: Did not attend.     Laureen Ochs Disney Ruggiero, LRT/CTRS  Dyllen Menning L 10/17/2014 5:00 PM

## 2014-10-17 NOTE — BHH Group Notes (Signed)
[facility] LCSW Aftercare Discharge Planning Group Note   10/17/2014 11:05 AM    Participation Quality:  Appropraite  Mood/Affect:  Appropriate  Depression Rating:  10  Anxiety Rating:  10  Thoughts of Suicide:  Yes  Will you contract for safety?  Yes  Current AVH:  No  Plan for Discharge/Comments:  Patient attended discharge planning group and actively participated in group. She advised of not doing well today and being overwhelmed by her home situation.  Patient shared she is followed by [facility] for outpatient services.  Suicide prevention education reviewed and SPE document provided.   Transportation Means: Patient has transportation.   Supports:  Patient has a support system.   Dyland Panuco, Eulas Post

## 2014-10-17 NOTE — Progress Notes (Signed)
D:  Pt.appears disheveled and sad.  Complains of depression and states "Nothing is going to make me better"  She rated her depression and hopelessness 10/10 on her self inventory sheet.  Stated " Vistaril is the only thing that keeps me from killing myself"  Pt also c/o heartburn this AM.  Pt does c/o suicidal thoughts but contracted for safety.   A:  Medicated pt per MD orders including prn Vistaril and Mylanta.  Emotional support offered, encouraging pt. To attend groups and get involved..  Expressed to pt. That her safety is important to [place].  Contracted for safety.  Fifteen minute checks in place.  R: Pt. Remains safe on unit.

## 2014-10-17 NOTE — BHH Group Notes (Signed)
[facility] LCSW Group Therapy  Overcoming Obstacles  10/17/2014 2:18 PM  Type of Therapy:  Group Therapy  Participation Level:  Did Not Attend Latoya Green 10/17/2014, 2:18 PM

## 2014-10-17 NOTE — Progress Notes (Signed)
D:  Patient in her room sleeping on first approach.  Patient appears depressed.  Patient states, "it's me."  Patient states I just want to get better.  Patient states she is passive SI but verbally contracts for safety.  Patient denies HI and denies AVH.   A: Staff to monitor Q 15 mins for safety.  Encouragement and support offered.  Scheduled medications administered per orders. R: Patient remains safe on the unit.  Patient did not attend group tonight.  Patient not visible on the unit.  Patient taking administered medications but refused Zyprexa tonight.  Patient stated she wanted to talk to her doctor before she took it because she does not remember talking with the doctor about it.

## 2014-10-17 NOTE — BHH Group Notes (Signed)
Adult Psychoeducational Group Note  Date:  10/17/2014 Time:  9:04 PM  Group Topic/Focus:  Wrap-Up Group:   The focus of this group is to help patients review their daily goal of treatment and discuss progress on daily workbooks.  Participation Level:  Did Not Attend  Participation Quality:  None  Affect:  None  Cognitive:  None  Insight: None  Engagement in Group:  None  Modes of Intervention:  Discussion  Additional Comments:  Makila did not attend group.  [name] A 10/17/2014, 9:04 PM
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
## 2014-10-18 MED ORDER — RISPERIDONE 2 MG PO TABS
2.0000 mg | ORAL_TABLET | Freq: Every day | ORAL | Status: DC
  Administered 2014-10-18: 2 mg via ORAL
  Filled 2014-10-18 (×4): qty 1

## 2014-10-18 NOTE — Plan of Care (Signed)
Problem: Ineffective individual coping Goal: STG: Patient will remain free from self harm Outcome: Progressing Patient remains free from self harm. 15 minute checks continued per protocol for patient safety.   Problem: Alteration in mood Goal: LTG-Patient reports reduction in suicidal thoughts (Patient reports reduction in suicidal thoughts and is able to verbalize a safety plan for whenever patient is feeling suicidal)  Outcome: Not Progressing Patient continues to have suicidal thoughts today. Pt is able to verbally contract for safety.  Problem: Diagnosis: Increased Risk For Suicide Attempt Goal: STG-Patient Will Comply With Medication Regime Outcome: Progressing Patient has adhered to medication regimen today with ease.

## 2014-10-18 NOTE — BHH Group Notes (Signed)
[facility] Group Notes:  (Nursing/MHT/Case Management/Adjunct)  Date:  10/18/2014  Time:  0900am  Type of Therapy:  Nurse Education  Participation Level:  Did Not Attend  Participation Quality:  Did not attend  Affect:  Did not attend  Cognitive:  Did not attend  Insight:  None  Engagement in Group:  Did not attend  Modes of Intervention:  Discussion, Education and Support  Summary of Progress/Problems: Patient was invited to group. Pt did not attend and remained in bed resting.  Charlyne Quale A 10/18/2014, 10:22 AM

## 2014-10-18 NOTE — Progress Notes (Signed)
D. Pt had been visible minimally this evening, spent much of her time in her room and in bed. Pt did speak about not going back to live with her aunt and reports that she will become suicidal at the thought of going there. Pt also spoke about wanting to spend an extended period of time in the hospital as she reports she is sick. Pt is able to contract for safety in the milieu and did receive all medications without incident. A. Support and encouragement provided. R. Safety maintained, will continue to monitor.

## 2014-10-18 NOTE — BHH Group Notes (Signed)
[place] LCSW Group Therapy  10/18/2014 3:00 PM  Type of Therapy:  Group Therapy  Participation Level:  Did Not Attend  Concha Pyo 10/18/2014, 3:00 PM
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
## 2014-10-18 NOTE — [facility] Group Notes (Signed)
Adult Psychoeducational Group Note  Date:  10/18/2014 Time:  8:36 PM  Group Topic/Focus:  Wrap-Up Group:   The focus of this group is to help patients review their daily goal of treatment and discuss progress on daily workbooks.  Participation Level:  Did Not Attend  Participation Quality:  None  Affect:  None  Cognitive:  None  Insight: None  Engagement in Group:  None  Modes of Intervention:  Discussion  Additional Comments:  Kaisey did not attend group.  [name] A 10/18/2014, 8:36 PM

## 2014-10-18 NOTE — Clinical Social Work Note (Signed)
CSW met with patient who advised of not doing well today.  She continues to endorse SI but able to contract for safety.  Patient also shared she is having problems with diarrhea.

## 2014-10-18 NOTE — Progress Notes (Signed)
D: Patient is alert and oriented. Pt's mood and affect is depressed, sad, and sullen. Pt is anxious at times. Pt's eye contact is brief. Pt denies HI and AVH. Pt reports passive suicidal thoughts this morning. Pt rates depression and hopelessness 10/10, and anxiety 6/10. Pt complains of anxiety this morning which decreased with PRN medication. Pt remains isolative in bed the majority of the day. Pt complains of anxiety this afternoon which decreased with PRN medication. Pt reports to RN this evening that today at lunch she felt a little light headed, pt experiencing HTN this evening, pt denies lightheadedness this evening. Pt expresses interest in being hospitalized long term. A: Pt encouraged several times to get out of bed today and attend unit groups. Active listening by RN. Encouragement/Support provided to pt. Pt encouraged to talk with LCSW about IOP programs. Fall precautions reviewed with pt, will monitor. Medication education reviewed with pt. PRN medication administered for anxiety per providers orders (See MAR). Scheduled medications administered per providers orders (See MAR).  R: Pt verbally contracts for safety, agrees not to harm self and agrees to come to staff with increased intensity of suicidal thoughts. Patient cooperative and receptive to nursing interventions. Pt remains safe.
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
## 2014-10-19 MED ORDER — RISPERIDONE 3 MG PO TABS
3.0000 mg | ORAL_TABLET | Freq: Every day | ORAL | Status: DC
  Administered 2014-10-19 – 2014-10-20 (×2): 3 mg via ORAL
  Filled 2014-10-19 (×5): qty 1

## 2014-10-19 MED ORDER — LORAZEPAM 1 MG PO TABS
1.0000 mg | ORAL_TABLET | Freq: Every day | ORAL | Status: DC
  Administered 2014-10-19: 1 mg via ORAL
  Filled 2014-10-19 (×2): qty 1

## 2014-10-19 MED ORDER — HYDROXYZINE HCL 25 MG PO TABS
25.0000 mg | ORAL_TABLET | Freq: Four times a day (QID) | ORAL | Status: DC | PRN
  Administered 2014-10-19 – 2014-10-20 (×2): 25 mg via ORAL
  Filled 2014-10-19 (×2): qty 1

## 2014-10-19 NOTE — Progress Notes (Signed)
D: Pt presents with flat affect and depressed mood. Pt rates depression 10/10. Hopelessness 10/10. Anxiety 6/10. Pt endorses suicidal ideations and verbally contracts not to harm self. Pt reports poor sleep last night and reported sleeping only two hours due to restless leg syndrome. Pt appears sad and disheveled on approach. Pt compliant with taking her meds and denies any adverse effects to her meds. Pt stated that she needs to take her vistaril throughout the day to help reduce her anxiety.  A: Medications administered as ordered per MD. Verbal support given. Pt encouraged to attend groups. 15 minute checks performed for safety.  R: Pt stated goal is to stay awake and stay out of the bed. Pt compliant with treatment plan. Pt safety  Maintained.
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
## 2014-10-19 NOTE — Progress Notes (Signed)
Recreation Therapy Notes  Date: 05.04.2016 Time: 9:30am Location: [address] Day[address]   Group Topic: Stress Management  Goal Area(s) Addresses:  Patient will actively participate in stress management techniques presented during session.   Behavioral Response: Did not attend.   Latoya Green Fleurette Woolbright, LRT/CTRS  Ruddy Swire L 10/19/2014 1:00 PM

## 2014-10-19 NOTE — [facility] Group Notes (Signed)
[facility] LCSW Group Therapy  Emotional Regulation 1:15 - 2: 30 PM        10/19/2014  3:09 PM    Type of Therapy:  Group Therapy  Participation Level:  Appropriate  Participation Quality:  Appropriate  Affect:  Depressed  Cognitive:   Appropriate  Insight:  Developing/Improving  Engagement in Therapy:  Developing/Improving  Modes of Intervention:  Discussion Exploration Problem-Solving Supportive  Summary of Progress/Problems:  Group topic was emotional regulations.  Patient participated in the discussion and was able to identify an emotion that needed to regulated.  Patient advised she has to deal with frustration in her relationship with aunt from whom she rents.  She also shared she is disappointed with the financial cuts she has undergone over the past year which makes it difficult to pay bills and more dependent on her aunt for help.  Concha Pyo 10/19/2014 3:09 PM

## 2014-10-19 NOTE — Progress Notes (Signed)
Recreation Therapy Notes   Animal-Assisted Activity (AAA) Program Checklist/Progress Notes Patient Eligibility Criteria Checklist & Daily Group note for Rec Tx Intervention  Date: 05.03.2016 Time: 2:45pm Location: [address]   AAA/T Program Assumption of Risk Form signed by Patient/ or Parent Legal Guardian yes  Patient is free of allergies or sever asthma yes  Patient reports no fear of animals yes  Patient reports no history of cruelty to animals yes  Patient understands his/her participation is voluntary yes  Patient washes hands before animal contact yes  Patient washes hands after animal contact yes  Behavioral Response: Engaged, Appropriate    Education: Contractor, Appropriate Animal Interaction   Education Outcome: Acknowledges education.   Clinical Observations/Feedback: Patient interacted appropriately with therapy dog a peers during session.    Laureen Ochs Prestyn Stanco, LRT/CTRS  Jakira Mcfadden L 10/19/2014 8:46 AM

## 2014-10-19 NOTE — BHH Group Notes (Signed)
Adult Psychoeducational Group Note  Date:  10/19/2014 Time:  9:14 PM  Group Topic/Focus:  Wrap-Up Group:   The focus of this group is to help patients review their daily goal of treatment and discuss progress on daily workbooks.  Participation Level:  Minimal  Participation Quality:  Attentive  Affect:  Flat  Cognitive:  Alert  Insight: Good  Engagement in Group:  Limited  Modes of Intervention:  Discussion  Additional Comments:  Latoya Green stated she watched a good movie and talked to the doctor.  Her discharge plans are still up in the air.  [name] A 10/19/2014, 9:14 PM

## 2014-10-19 NOTE — BHH Group Notes (Signed)
[facility] LCSW Aftercare Discharge Planning Group Note   10/19/2014  11:03 AM   Participation Quality:  Did not attend group.  Concha Pyo  10/19/2014  11:03 AM

## 2014-10-20 MED ORDER — LORAZEPAM 1 MG PO TABS
1.0000 mg | ORAL_TABLET | Freq: Three times a day (TID) | ORAL | Status: DC | PRN
Start: 2014-10-20 — End: 2014-10-24
  Administered 2014-10-21 – 2014-10-23 (×3): 1 mg via ORAL
  Filled 2014-10-20 (×3): qty 1

## 2014-10-20 MED ORDER — DIPHENHYDRAMINE HCL 25 MG PO CAPS
25.0000 mg | ORAL_CAPSULE | Freq: Every evening | ORAL | Status: DC | PRN
  Administered 2014-10-20 – 2014-10-21 (×2): 25 mg via ORAL
  Filled 2014-10-20 (×2): qty 1

## 2014-10-20 NOTE — BHH Group Notes (Signed)
[place] LCSW Group Therapy  [facility] 1:15 - 2:30 PM  10/20/2014 3:17 PM   Type of Therapy:  Group Therapy  Participation Level: Minimal  Participation Quality:  Attentive  Affect:  Depressed  Cognitive:  Appropriate  Insight:  Developing/Improving   Engagement in Therapy:  Developing/Improving   Modes of Intervention:  Discussion, Education, Exploration, Problem-Solving, Rapport Building, Support   Summary of Progress/Problems:   Patient was attentive to speaker from the [employer] as he shared his story of dealing with mental health/substance abuse issues and overcoming it by working a recovery program.  Patient expressed interest in their programs and services and received information on their agency.    Concha Pyo 10/20/2014 3:17 PM

## 2014-10-20 NOTE — Progress Notes (Signed)
D: Patient has blunted affect and depressed mood. She reported on the self inventory sheet that she's sleeping poorly at night, appetite and ability to concentrate are both good and energy level is low. Patient rates depression/hopelessness "9" and anxiety "7". Writer observed that the patient has been in bed resting all day; no participation in groups today. In adherence with medications and tolerating them well.  A: Support and encouragement provided to patient. Administered medications per ordering MD. Monitor Q15 minute checks for safety.  R: Patient receptive. Denies SI/HI and AVH. Patient remains safe on the unit.

## 2014-10-20 NOTE — BHH Group Notes (Signed)
Adult Psychoeducational Group Note  Date:  10/20/2014 Time:  9:00 AM  Group Topic/Focus:  Goals Group:   The focus of this group is to help patients establish daily goals to achieve during treatment and discuss how the patient can incorporate goal setting into their daily lives to aide in recovery.  Participation Level:  Did Not Attend  Additional Comments: Patient was resting in bed during group.  Eduard Roux E 10/20/2014, 12:34 PM

## 2014-10-20 NOTE — Progress Notes (Signed)
Patient ID: Latoya Green, female   DOB: 12/06/1955, 59 y.o.   MRN: [id number] [facility] MD Progress Note  10/20/2014 4:56 PM Latoya Green  MRN:  [id number] Subjective:  Although still reporting symptoms- depression, anxiety, concerns about skin, she does acknowledge that today she is " a little better".   Objective:  Patient case discussed with treatment team and patient seen.  Patient has ongoing symptoms which include ongoing depression, delusional thoughts ( somatic preoccupations concerning skin " bumps" and eruptions) , and tends to ruminate about her psychosocial stressors at length, particularly regarding her feeling overly dependent on her aunt. She does, however, seem significantly improved today and this was noticed in several ways. She has better eye contact, and is more future oriented. States she is hoping to " get better" so she can return to work soon and states her job/work makes her feel better and to have a sense of purpose and structure. Also smiled and even laughed briefly ( appropriately ) during session and seemed better related and engaged overall. She was also less intensely focused on her somatic ruminations. We discussed importance of maintaining medication compliance, as she has a history of stopping medications after discharge or when she perceives side effects, even if minor. At this time tolerating medications well  Complains of insomnia- we discussed options, prefers Benadryl PRNs for insomnia, as she states she takes these at home with good result.  Principal Problem: Schizoaffective Disorder , Depressed  Diagnosis:   Patient Active Problem List   Diagnosis Date Noted  . Schizoaffective disorder, depressive type [F25.1] 10/15/2014  . Schizoaffective disorder [F25.9] 04/06/2014  . MDD (major depressive disorder), recurrent, severe, with psychosis [F33.3] 04/05/2014  . Major depression [F32.2] 02/28/2014  . MDD (major depressive disorder), single episode, severe with  psychotic features [F32.3] 12/29/2013   Total Time spent with patient: 25 minutes    Past Medical History:  Past Medical History  Diagnosis Date  . Depression   . Cancer     skin cancer 1997  . Epilepsy     1994    Past Surgical History  Procedure Laterality Date  . Skin ca removed     Family History: History reviewed. No pertinent family history. Social History:  History  Alcohol Use No     History  Drug Use No    History   Social History  . Marital Status: Divorced    Spouse Name: N/A  . Number of Children: N/A  . Years of Education: N/A   Social History Main Topics  . Smoking status: Current Every Day Smoker -- 1.00 packs/day for 25 years    Types: Cigarettes  . Smokeless tobacco: Not on file  . Alcohol Use: No  . Drug Use: No  . Sexual Activity: Not Currently    Birth Control/ Protection: Abstinence   Other Topics Concern  . None   Social History Narrative   Additional History:    Sleep: poor   Appetite:  Good   Assessment:   Musculoskeletal: Strength & Muscle Tone: within normal limits Gait & Station: normal Patient leans: N/A   Psychiatric Specialty Exam: Physical Exam  Vitals reviewed. Psychiatric: Her mood appears anxious.    Review of Systems  Constitutional: Negative.   HENT: Negative.   Eyes: Negative.   Respiratory: Negative.   Cardiovascular: Negative.   Gastrointestinal: Negative.   Genitourinary: Negative.   Musculoskeletal: Negative.   Skin:       Dermatologic somatic preoccupations   Neurological: Negative.  Psychiatric/Behavioral: Positive for depression and suicidal ideas.    Blood pressure 102/67, pulse 98, temperature 97.6 F (36.4 C), temperature source Oral, resp. rate 16, height 5\' 7"  (1.702 m), weight 175 lb (79.379 kg).Body mass index is 27.4 kg/(m^2).  General Appearance: fairly groomed   Engineer, water:: improved   Speech:  Normal Rate  Volume:  Normal  Mood:   Still depressed but better than upon  admission and affect more reactive   Affect:    More reactive   Thought Process:  Coherent/ tends to perseverate on issues that concern her   Orientation:  Full (Time, Place, and Person)  Thought Content:  Rumination,  But less intense than before, at this time denies hallucinations and does not appear internally preoccupied   Suicidal Thoughts:  Denies any active Suicidal thoughts, plans , ideations and contracts for safety on unit /.  Homicidal Thoughts:  No- of note, denies any violent ideations towards aunt or anyone else   Memory:  Immediate;   Fair Recent;   Fair Remote;   Fair  Judgement:  Fair  Insight:  Fair  Psychomotor Activity:  Decreased   Concentration:  Good  Recall:  Good  Fund of Knowledge:Good  Language: Good  Akathisia:  Negative  Handed:  Right  AIMS (if indicated):     Assets:  Desire for Improvement Resilience Social Support  ADL's:  Intact  Cognition: WNL  Sleep:  Number of Hours: 1.25     Current Medications: Current Facility-Administered Medications  Medication Dose Route Frequency Provider Last Rate Last Dose  . acetaminophen (TYLENOL) tablet 650 mg  650 mg Oral Q6H PRN Harriet Butte, NP      . alum & mag hydroxide-simeth (MAALOX/MYLANTA) 200-200-20 MG/5ML suspension 30 mL  30 mL Oral Q4H PRN Harriet Butte, NP   30 mL at 10/19/14 1007  . carbamazepine (TEGRETOL XR) 12 hr tablet 200 mg  200 mg Oral BID Harriet Butte, NP   200 mg at 10/20/14 0819  . hydrOXYzine (ATARAX/VISTARIL) tablet 25 mg  25 mg Oral Q6H PRN Jenne Campus, MD   25 mg at 10/20/14 0417  . loperamide (IMODIUM) capsule 2 mg  2 mg Oral PRN Encarnacion Slates, NP   2 mg at 10/15/14 1519  . LORazepam (ATIVAN) tablet 1 mg  1 mg Oral QHS Jenne Campus, MD   1 mg at 10/19/14 2204  . magnesium hydroxide (MILK OF MAGNESIA) suspension 30 mL  30 mL Oral Daily PRN Harriet Butte, NP      . nicotine (NICODERM CQ - dosed in mg/24 hours) patch 21 mg  21 mg Transdermal Daily Jenne Campus, MD    21 mg at 10/20/14 0820  . risperiDONE (RISPERDAL) tablet 3 mg  3 mg Oral QHS Jenne Campus, MD   3 mg at 10/19/14 2204  . sertraline (ZOLOFT) tablet 150 mg  150 mg Oral Daily Jenne Campus, MD   150 mg at 10/20/14 6387    Lab Results:  No results found for this or any previous visit (from the past 36 hour(s)).  Physical Findings: AIMS: Facial and Oral Movements Muscles of Facial Expression: None, normal Lips and Perioral Area: None, normal Jaw: None, normal Tongue: None, normal,Extremity Movements Upper (arms, wrists, hands, fingers): None, normal Lower (legs, knees, ankles, toes): None, normal, Trunk Movements Neck, shoulders, hips: None, normal, Overall Severity Severity of abnormal movements (highest score from questions above): None, normal Incapacitation due to abnormal movements:  None, normal Patient's awareness of abnormal movements (rate only patient's report): No Awareness, Dental Status Current problems with teeth and/or dentures?: No Does patient usually wear dentures?: No  CIWA:  CIWA-Ar Total: 2 COWS:      Assessment- patient  Remains symptomatic, but the intensity of symptoms seems improved. In particular, today less intensely somatically focused and more future oriented, talking more about wanting to return to work soon. At present denying medication side effects except for insomnia, which is probably not a side effect  But a symptom of her depression. Seems better engaged and has been somewhat more visible on unit .   Treatment Plan Summary: Continue medications as below Tegretol 200 mgrs BID for mood disorder Zoloft 150 mgrs QDAY for depression   Risperidone   3  mgrs QHS for psyhotic symptoms/mood disorder  Change  Ativan  To 1 mgr Q  8 hours PRN severe anxiety Start ( at patient's request ) Benadryl 25 mgrs QHS for Insomnnia. Will follow .      Medical Decision Making:  Established Problem, Stable/Improving (1), Review of Psycho-Social Stressors (1),  Review or order clinical lab tests (1), Review of Medication Regimen & Side Effects (2) and Review of New Medication or Change in Dosage (2)   Neita Garnet , MD  10/20/2014, 4:56 PM

## 2014-10-20 NOTE — Progress Notes (Signed)
D. Pt had been up and visible in milieu this evening, pt still ruminating and preoccupied about housing situation and on-going problems with her aunt. Pt did receive medications this evening without incident. Pt, so far, has been up and pacing hallway and has been unable to fall asleep yet, however pt is currently in bed and trying to get some rest. A. Support and encouragement provided. R. Safety maintained, will continue to monitor.

## 2014-10-21 NOTE — Progress Notes (Signed)
D: Pt presents flat in affect and depressed in mood. Pt visible within the milieu but with minimal interactions with others. Pt is passive for SI. Pt verbally contracts for safety. Pt periodically paces the hall. Pt was given a prn benadryl for sleep. Pt remained awake in her room. Pt was encouraged to turn off her lights. " It won't help". Pt initially refused writer's offer for Ativan. Pt received a minimal amount of sleep with the addition of Ativan. Pt noted to be restless during periods where she was snoring with her eyes closed.  A: Writer administered scheduled and prn medications to pt, per MD orders. Continued support and availability as needed was extended to this pt. Staff continue to monitor pt with q36min checks.  R: No adverse drug reactions noted. Pt receptive to treatment. Pt remains safe at this time.

## 2014-10-21 NOTE — Tx Team (Signed)
Interdisciplinary Treatment Plan Update (Adult)  Date:  10/21/2014  Time Reviewed:  11:03 AM   Progress in Treatment: Attending groups: Patient is attending groups. Participating in groups:  Patient engages in discussion Taking medication as prescribed:  Patient is taking medications Tolerating medication:  Patient is tolerating medications Family/Significant othe contact made:   No, patient declined collateral contact Patient understands diagnosis:Yes, patient understands diagnosis and need for treatment Discussing patient identified problems/goals with staff:  Yes, patient is able to express goals/problems Medical problems stabilized or resolved:  Yes Denies suicidal/homicidal ideation: Yes, patient is denying SI/HI. Issues/concerns per patient self-inventory:   Other:   Discharge Plan or Barriers:   Patient will return to her home and follow up with [facility]  Reason for Continuation of Hospitalization: Anxiety Depression Medication stabilization   Comments:   Continue medication stabilization.  Patient accepted for services with [facility] Team   Additional comments:  Patient and CSW reviewed Patient Discharge Process Letter/Patient Involvement Form.  Patient verbalized understanding and signed form.  Patient and CSW also reviewed patient's goals/discharge plan.  Patient verbalized understanding and agreed to goals/treatment plan.  Estimated length of stay: 4-5 days  New goal(s):  None identified     Attendees: Patient 10/21/2014 11:03 AM   Family:   10/21/2014 11:03 AM   Physician:  Neita Garnet, MD 10/21/2014 11:03 AM   Nursing:    10/21/2014 11:03 AM   Clinical Social Worker:  Joette Catching, LCSW 10/21/2014 11:03 AM   Clinical Social Worker:  Edwyna Shell, LCSW/Lead SW  10/21/2014 11:03 AM   Case Manager:  Lars Pinks, RN 10/21/2014 11:03 AM   Other:   10/21/2014 11:03 AM  Other:   10/21/2014  11:03 AM   Other:  Desma Paganini, RN 10/21/2014 11:03 AM   Other:   [place], RN 10/21/2014 11:03 AM   Other:  10/21/2014 11:03 AM   Other:   10/21/2014 11:03 AM   Other:   10/21/2014 11:03 AM   Other:  10/21/2014 11:03 AM   Other:   10/21/2014 11:03 AM    Scribe for Treatment Team:   Concha Pyo, 10/21/2014   11:03 AM

## 2014-10-21 NOTE — BHH Group Notes (Signed)
[place] LCSW Group Therapy  Feelings Around Relapse 1:15 -2:30        10/21/2014 4:01 PM   Type of Therapy:  Group Therapy  Participation Level:  Appropriate  Participation Quality:  Appropriate  Affect:  Appropriate  Cognitive:  Attentive Appropriate  Insight:  Developing/Improving  Engagement in Therapy: Developing/Improving  Modes of Intervention:  Discussion Exploration Problem-Solving Supportive  Summary of Progress/Problems:  The topic for today was feelings around relapse.  Patient processed feelings toward relapse and was able to relate to peers. Patient shared relapsing for her would be to getting stuck in situational problems.  She shared she is having difficulty with finances.  Patient shared she has been in contact with her supervisor and can return to work.  She is hopeful to discharge over the weekend and get back to work soon.   Concha Pyo 10/21/2014 4:01 PM

## 2014-10-21 NOTE — Progress Notes (Signed)
Pt reported to Probation officer on arrival to unit post dinner; that she was escorted by staff to the bathroom in the hall from the cafeteria during dinner and had an emesis X 1. Per pt " I think its the Risperdal, It makes my throat closes up and I can't get my food down". " This happened earlier with the steak sandwich this afternoon for lunch, I just could not get my food down". Pt was found by writer in the bathroom vomiting again at approximately 1815 as Probation officer went to check on her. Ginger ale was given and pt tolerated it well. NP Lindell Spar, NP) was made aware of event, new order received to d/c medication and pt will be reassess tomorrow by assigned physician Dr. Parke Poisson. Pt was informed of update and she was in agreement.

## 2014-10-21 NOTE — BHH Group Notes (Signed)
[facility] LCSW Aftercare Discharge Planning Group Note   10/21/2014 11:05 AM    Participation Quality:  Appropraite  Mood/Affect:  Appropriate  Depression Rating:  5  Anxiety Rating:  5  Thoughts of Suicide:  No  Will you contract for safety?   NA  Current AVH:  No  Plan for Discharge/Comments:  Patient attended discharge planning group and actively participated in group. Patient reports not being suicidal today but continues to be very hopeless and helpless.   Suicide prevention education reviewed and SPE document provided.   Transportation Means: Patient has transportation.   Supports:  Patient has a support system.   Ankith Edmonston, Eulas Post

## 2014-10-21 NOTE — Progress Notes (Signed)
Patient ID: Latoya Green, female   DOB: Jun 11, 1956, 58 y.o.   MRN: [id number] D: Client visible on the unit, reports depression at "5" of 10, anxiety "6" of 10. Client talks about living with aunt and how she makes her feel belittled. "I can't describe to you how she make me feel, how she talks to me" Client also reports "I have a lot of medical problems and it would probably be best if I went to an assisted living or something"  Client reports passive SI, with no plan, . A: Writer provided emotional support, encouraged client to tell staff if suicidal thought become a plan. Writer reviewed medications, administered as ordered. Staff will monitor q70min for safety. R:Client is safe on the unit, attended group. Client contracts for safety.

## 2014-10-21 NOTE — Progress Notes (Signed)
Patient ID: Latoya Green, female   DOB: 10/17/1955, 59 y.o.   MRN: [id number] [facility] MD Progress Note  10/21/2014 2:35 PM Latoya Green  MRN:  [id number] Subjective:   Patient is improving and as she feels better, she is becoming more focused on getting discharged soon. States " I was wondering when I can be discharged, because I need to coordinate with my aunt to pick me up and because I need to go back to work soon. I enjoy working"   Objective:  Patient case discussed with treatment team and patient seen.  Patient  Is reported as improved and as above, is becoming less ruminative about delusional material, and more future oriented. Also seems less ruminative about relationship issues with aunt. Does not bring this up  ,and when asked directly states " it is just that she does not like me to smoke, but I am 59 years old and I can smoke if I want to, I'll just not do it when she is around". No disruptive behaviors on unit, going to groups, more visible on unit. At this time denies any medication side effects and no akatisia or dystonia/abnormal involuntary movements are noted. Insight into illness is still somewhat limited, and states she is unsure why she needs antipsychotic medication. More amenable to discuss , however, and states she does realize she is feeling better and tolerating medication well at this time. Of note, we discussed option of a long acting IM depot medication but not interested . Sleep remains fair , but she states she is sleeping better.    Principal Problem: Schizoaffective Disorder , Depressed  Diagnosis:   Patient Active Problem List   Diagnosis Date Noted  . Schizoaffective disorder, depressive type [F25.1] 10/15/2014  . Schizoaffective disorder [F25.9] 04/06/2014  . MDD (major depressive disorder), recurrent, severe, with psychosis [F33.3] 04/05/2014  . Major depression [F32.2] 02/28/2014  . MDD (major depressive disorder), single episode, severe with psychotic  features [F32.3] 12/29/2013   Total Time spent with patient: 25 minutes    Past Medical History:  Past Medical History  Diagnosis Date  . Depression   . Cancer     skin cancer 1997  . Epilepsy     1994    Past Surgical History  Procedure Laterality Date  . Skin ca removed     Family History: History reviewed. No pertinent family history. Social History:  History  Alcohol Use No     History  Drug Use No    History   Social History  . Marital Status: Divorced    Spouse Name: N/A  . Number of Children: N/A  . Years of Education: N/A   Social History Main Topics  . Smoking status: Current Every Day Smoker -- 1.00 packs/day for 25 years    Types: Cigarettes  . Smokeless tobacco: Not on file  . Alcohol Use: No  . Drug Use: No  . Sexual Activity: Not Currently    Birth Control/ Protection: Abstinence   Other Topics Concern  . None   Social History Narrative   Additional History:    Sleep: poor   Appetite:  Good   Assessment:   Musculoskeletal: Strength & Muscle Tone: within normal limits Gait & Station: normal Patient leans: N/A   Psychiatric Specialty Exam: Physical Exam  Vitals reviewed. Psychiatric: Her mood appears anxious.    Review of Systems  Constitutional: Negative.   HENT: Negative.   Eyes: Negative.   Respiratory: Negative.   Cardiovascular: Negative.  Gastrointestinal: Negative.   Genitourinary: Negative.   Musculoskeletal: Negative.   Skin: Negative.        Less ruminative about reported rash  Neurological: Negative.   Endo/Heme/Allergies: Negative.   Psychiatric/Behavioral: Positive for depression.       Somatic delusions    Blood pressure 102/64, pulse 85, temperature 98 F (36.7 C), temperature source Oral, resp. rate 20, height 5\' 7"  (1.702 m), weight 175 lb (79.379 kg).Body mass index is 27.4 kg/(m^2).  General Appearance:  Grooming is improved  Eye Contact:: improved   Speech:  Normal Rate  Volume:  Normal  Mood:   Presents with gradually improving mood, and affect is more reactive   Affect:    More reactive   Thought Process:  Coherent/ less ruminative about negative aspects of her life and less ruminative about  Relationship issues , particularly with her aunt .   Orientation:  Full (Time, Place, and Person)  Thought Content:  Rumination,  But less intense than before, No hallucinations   Suicidal Thoughts:  Describes occasional passive SI, but denies plan or intention and states " I can push it out of my mind a little easier "  Homicidal Thoughts:  No- of note, denies any violent ideations towards aunt or anyone else   Memory:  Immediate;   Fair Recent;   Fair Remote;   Fair  Judgement:  Fair  Insight:  Fair  Psychomotor Activity:  Decreased and but improving , less withdrawn , less isolative   Concentration:  Good  Recall:  Good  Fund of Knowledge:Good  Language: Good  Akathisia:  Negative  Handed:  Right  AIMS (if indicated):     Assets:  Desire for Improvement Resilience Social Support  ADL's:  Intact  Cognition: WNL  Sleep:  Number of Hours: 1.25     Current Medications: Current Facility-Administered Medications  Medication Dose Route Frequency Provider Last Rate Last Dose  . acetaminophen (TYLENOL) tablet 650 mg  650 mg Oral Q6H PRN Harriet Butte, NP      . alum & mag hydroxide-simeth (MAALOX/MYLANTA) 200-200-20 MG/5ML suspension 30 mL  30 mL Oral Q4H PRN Harriet Butte, NP   30 mL at 10/21/14 1158  . carbamazepine (TEGRETOL XR) 12 hr tablet 200 mg  200 mg Oral BID Harriet Butte, NP   200 mg at 10/21/14 0830  . diphenhydrAMINE (BENADRYL) capsule 25 mg  25 mg Oral QHS PRN Jenne Campus, MD   25 mg at 10/20/14 2211  . loperamide (IMODIUM) capsule 2 mg  2 mg Oral PRN Encarnacion Slates, NP   2 mg at 10/15/14 1519  . LORazepam (ATIVAN) tablet 1 mg  1 mg Oral TID PRN Jenne Campus, MD   1 mg at 10/21/14 0025  . magnesium hydroxide (MILK OF MAGNESIA) suspension 30 mL  30 mL Oral  Daily PRN Harriet Butte, NP      . nicotine (NICODERM CQ - dosed in mg/24 hours) patch 21 mg  21 mg Transdermal Daily Jenne Campus, MD   21 mg at 10/21/14 0835  . risperiDONE (RISPERDAL) tablet 3 mg  3 mg Oral QHS Myer Peer Shaquita Fort, MD   3 mg at 10/20/14 2211  . sertraline (ZOLOFT) tablet 150 mg  150 mg Oral Daily Jenne Campus, MD   150 mg at 10/21/14 0830    Lab Results:  No results found for this or any previous visit (from the past 77 hour(s)).  Physical Findings: AIMS:  Facial and Oral Movements Muscles of Facial Expression: None, normal Lips and Perioral Area: None, normal Jaw: None, normal Tongue: None, normal,Extremity Movements Upper (arms, wrists, hands, fingers): None, normal Lower (legs, knees, ankles, toes): None, normal, Trunk Movements Neck, shoulders, hips: None, normal, Overall Severity Severity of abnormal movements (highest score from questions above): None, normal Incapacitation due to abnormal movements: None, normal Patient's awareness of abnormal movements (rate only patient's report): No Awareness, Dental Status Current problems with teeth and/or dentures?: No Does patient usually wear dentures?: No  CIWA:  CIWA-Ar Total: 2 COWS:      Assessment- patient is improving compared to admissions- at this time less severely depressed, less preoccupied, less ruminative, and less fixated on delusional/somatic ruminations, and is more future oriented, wanting to return to work soon. Remains delusional , but to a lesser degree.   Treatment Plan Summary: Continue medications as below Tegretol 200 mgrs BID for mood disorder Zoloft 150 mgrs QDAY for depression  Risperidone   3  mgrs QHS for psyhotic symptoms/mood disorder   Ativan 1 mgr Q  8 hours PRN severe anxiety Benadryl 25 mgrs QHS for Insomnnia. Treatment team working on disposition planning  Will follow .      Medical Decision Making:  Established Problem, Stable/Improving (1), Review of Psycho-Social  Stressors (1), Review or order clinical lab tests (1), Review of Medication Regimen & Side Effects (2) and Review of New Medication or Change in Dosage (2)   Neita Garnet , MD  10/21/2014, 2:35 PM

## 2014-10-21 NOTE — Plan of Care (Signed)
Problem: Diagnosis: Increased Risk For Suicide Attempt Goal: STG-Patient Will Report Suicidal Feelings to Staff Pt reports having SI with no plan. Pt verbally contracts for safety.

## 2014-10-21 NOTE — Progress Notes (Signed)
Pt is looking forward to going home and being able to put on her make-up. She also stated that today was not a bad day. While she isn't found any groups to be helpful for her, she did enjoy the group with [facility].

## 2014-10-21 NOTE — Plan of Care (Signed)
Problem: Diagnosis: Increased Risk For Suicide Attempt Goal: LTG-Patient Will Show Positive Response to Medication LTG (by discharge) : Patient will show positive response to medication and will participate in the development of the discharge plan.  Outcome: Progressing Pt denied SI and verbally contracted for safety when assessed this shift. Pt presents with congruent affect and brightens up on approach and during conversations. stated she feels " all right and thinking of returning to work". Per pt she slept well for the hours that she finally got to rest. Denied adverse drug reactions as well. Safety maintained on Q 15 minutes checks as per order.

## 2014-10-21 NOTE — Progress Notes (Addendum)
D: Pt awake in bed at this time. Reported feeling " ok" and thinking of going back to work and wanted to talk to Dr. Parke Poisson about d/c plan for early next week; during conversation with writer this afternoon. Pt reported good appetite, good concentration level on self inventory sheet. Pt rated her depression 9/10, hopelessness 8/10 and anxiety 6/10. Pt's goal for today is to " talk to doctor" related to medication regimen and d/c.  A: Mediation education done prior to administration. Emotional support and availability offered to pt and she was encouraged to voice her concerns / needs to staff to be addressed. Q 15 minutes checks remains effective as ordered to promote pt's safety.  R: Pt presents with congruent affect and mood at this time; she brightens up on approach and during conversations this afternoon in comparison to initial contact this AM. Pt denied SI, HI, AVH and pain when assessed. Pt declined PRN Ativan when offered for report of anxiety level 6/10 stated to writer " I'll rather wait and take it closer to bedtime". Verbalized her concerns appropriately about recent change in her medication regimen (Vistaril to Risperdal) to manage her anxiety. Per pt " this is silly but true, the Vistaril helped with my diarrhea" and assigned physician was made aware of pt's concerns. Pt has been compliant with ordered medications and attended scheduled group this AM. Safety maintained on / off unit. Continue POC.

## 2014-10-22 MED ORDER — DIPHENHYDRAMINE HCL 25 MG PO CAPS
50.0000 mg | ORAL_CAPSULE | Freq: Every evening | ORAL | Status: DC | PRN
  Administered 2014-10-22 – 2014-10-23 (×2): 50 mg via ORAL
  Filled 2014-10-22 (×2): qty 2

## 2014-10-22 NOTE — [facility] Group Notes (Signed)
[place] Group Notes:  (Clinical Social Work)  10/22/2014     1:15-2:15PM  Summary of Progress/Problems:   The main focus of today's process group was to discuss unhealthy coping techniques currently being employed by patients, and reasons driving this.  Motivational Interviewing was utilized to help patients explore in depth the perceived benefits and costs of unhealthy coping techniques, especially those shared by most patients in the group such as isolation and people-pleasing.  Healthier coping skills were explored and much support provided between patients.   The patient expressed that typical unhealthy coping involves smoking cigarettes.  She was very encouraging to another patient about maintaining hope and being willing to allow doctors time to test various medications.  Type of Therapy:  Group Therapy - Process   Participation Level:  Active  Participation Quality:  Attentive and Sharing  Affect:  Blunted  Cognitive:  Appropriate and Oriented  Insight:  Engaged  Engagement in Therapy:  Engaged  Modes of Intervention:  Education, Motivational Interviewing  Selmer Dominion, Bieber 10/22/2014, 4:49 PM

## 2014-10-22 NOTE — Progress Notes (Signed)
[place] Group Notes:  (Nursing/MHT/Case Management/Adjunct)  Date:  10/22/2014  Time:  10:37 PM  Type of Therapy:  Group Therapy  Participation Level:  Active  Participation Quality:  Appropriate  Affect:  Appropriate  Cognitive:  Appropriate  Insight:  Appropriate  Engagement in Group:  Engaged  Modes of Intervention:  Socialization and Support  Summary of Progress/Problems: Pt. Was participated in group discussion and had good insight.  Pt. Stated she plans to use healthy communication skills at work and when feeling anxious.  Lanell Persons 10/22/2014, 10:37 PM

## 2014-10-22 NOTE — [facility] Group Notes (Signed)
[place] Group Notes:  (Nursing/MHT/Case Management/Adjunct)  Date:  10/22/2014  Time:  0915am  Type of Therapy:  Nurse Education  Participation Level:  Active  Participation Quality:  Appropriate and Attentive  Affect:  Blunted  Cognitive:  Alert and Appropriate  Insight:  Appropriate  Engagement in Group:  Engaged  Modes of Intervention:  Discussion, Education and Support  Summary of Progress/Problems: Patient attended group, remained engaged and responded appropriately when prompted.  Charlyne Quale A 10/22/2014, 9:49 AM

## 2014-10-22 NOTE — Progress Notes (Signed)
Patient ID: Latoya Green, female   DOB: 01-15-1956, 59 y.o.   MRN: [id number] [facility] MD Progress Note  10/22/2014 4:53 PM Cesia Orf  MRN:  [id number] Subjective:   Patient states she is still feeling "the same". However, as session progresses she states she realizes she is doing better. She remains focused on medications, but at this time states she is not having side effects and is willing to continue Risperidone ( which seems to be very helpful for patient, based on her significant improvement on it ). As she improves she is becoming more future oriented, and is focusing more on being discharged soon, so she can return to work. She does continue to ruminate about her relationship with her aunt, but seems less focused on this, and states that " we will work things out, as long as I am working she is OK with me".    Objective:  Patient case discussed with treatment team and patient seen.  Significant improvement over recent days. From being poorly groomed, poorly related, depressed, anxious ,  and focused on somatic delusions, has progressed to improved grooming, improved relatedness and range of affect, and less ruminations . She is denying medication side effects at this time but continues to make statements such as that she is unsure if she really needs them. I have continued to work on the importance of medication compliance and the likelihood of recurrence of illness if non compliant. Patient could benefit from IM depot antipsychotic but is at present not interested in this type of medication. She is now more future oriented, and stating she is feeling bored and wanting to " go back to work, which I really enjoy doing". More visible in day room, no disruptive behaviors .   Principal Problem: Schizoaffective Disorder , Depressed  Diagnosis:   Patient Active Problem List   Diagnosis Date Noted  . Schizoaffective disorder, depressive type [F25.1] 10/15/2014  . Schizoaffective disorder  [F25.9] 04/06/2014  . MDD (major depressive disorder), recurrent, severe, with psychosis [F33.3] 04/05/2014  . Major depression [F32.2] 02/28/2014  . MDD (major depressive disorder), single episode, severe with psychotic features [F32.3] 12/29/2013   Total Time spent with patient: 20 minutes   Past Medical History:  Past Medical History  Diagnosis Date  . Depression   . Cancer     skin cancer 1997  . Epilepsy     1994    Past Surgical History  Procedure Laterality Date  . Skin ca removed     Family History: History reviewed. No pertinent family history. Social History:  History  Alcohol Use No     History  Drug Use No    History   Social History  . Marital Status: Divorced    Spouse Name: N/A  . Number of Children: N/A  . Years of Education: N/A   Social History Main Topics  . Smoking status: Current Every Day Smoker -- 1.00 packs/day for 25 years    Types: Cigarettes  . Smokeless tobacco: Not on file  . Alcohol Use: No  . Drug Use: No  . Sexual Activity: Not Currently    Birth Control/ Protection: Abstinence   Other Topics Concern  . None   Social History Narrative   Additional History:    Sleep:  Remains poor/suboptimal as per chart notes , but does not complain of or report insomnia at this time  Appetite:  Good   Assessment:   Musculoskeletal: Strength & Muscle Tone: within normal limits Gait & Station:  normal Patient leans: N/A   Psychiatric Specialty Exam: Physical Exam  Vitals reviewed. Psychiatric: Her mood appears anxious.    Review of Systems  Constitutional: Negative.   HENT: Negative.   Eyes: Negative.   Respiratory: Negative.   Cardiovascular: Negative.   Gastrointestinal: Negative.   Genitourinary: Negative.   Musculoskeletal: Negative.   Skin: Negative.   Neurological: Negative.   Endo/Heme/Allergies: Negative.   Psychiatric/Behavioral: Positive for depression.    Blood pressure 108/62, pulse 92, temperature 97.6 F  (36.4 C), temperature source Oral, resp. rate 18, height 5\' 7"  (1.702 m), weight 175 lb (79.379 kg).Body mass index is 27.4 kg/(m^2).  General Appearance:  Grooming is improved  Eye Contact:: improved   Speech:  Normal Rate  Volume:  Normal  Mood:  States mood is " the same" but appears less depressed   Affect:    More reactive , smiles at times appropriately  Thought Process:  more linear and less perseverative on somatic preoccupations/   Orientation:  Full (Time, Place, and Person)  Thought Content:  Rumination,  But less intense than before, No hallucinations   Suicidal Thoughts:  At this time denies suicidal plan or intention  Homicidal Thoughts:  No- of note, denies any violent ideations towards aunt or anyone else   Memory:  Immediate;   Fair Recent;   Fair Remote;   Fair  Judgement:  Fair  Insight:  Fair  Psychomotor Activity:  Decreased and but improving , less withdrawn , less isolative   Concentration:  Good  Recall:  Good  Fund of Knowledge:Good  Language: Good  Akathisia:  Negative  Handed:  Right  AIMS (if indicated):     Assets:  Desire for Improvement Resilience Social Support  ADL's:  Intact  Cognition: WNL  Sleep:  Number of Hours: 2.25     Current Medications: Current Facility-Administered Medications  Medication Dose Route Frequency Provider Last Rate Last Dose  . acetaminophen (TYLENOL) tablet 650 mg  650 mg Oral Q6H PRN Harriet Butte, NP      . alum & mag hydroxide-simeth (MAALOX/MYLANTA) 200-200-20 MG/5ML suspension 30 mL  30 mL Oral Q4H PRN Harriet Butte, NP   30 mL at 10/21/14 1158  . carbamazepine (TEGRETOL XR) 12 hr tablet 200 mg  200 mg Oral BID Harriet Butte, NP   200 mg at 10/22/14 0811  . diphenhydrAMINE (BENADRYL) capsule 25 mg  25 mg Oral QHS PRN Jenne Campus, MD   25 mg at 10/21/14 2211  . loperamide (IMODIUM) capsule 2 mg  2 mg Oral PRN Encarnacion Slates, NP   2 mg at 10/15/14 1519  . LORazepam (ATIVAN) tablet 1 mg  1 mg Oral TID PRN  Jenne Campus, MD   1 mg at 10/21/14 0025  . magnesium hydroxide (MILK OF MAGNESIA) suspension 30 mL  30 mL Oral Daily PRN Harriet Butte, NP      . nicotine (NICODERM CQ - dosed in mg/24 hours) patch 21 mg  21 mg Transdermal Daily Jenne Campus, MD   21 mg at 10/22/14 1914  . sertraline (ZOLOFT) tablet 150 mg  150 mg Oral Daily Jenne Campus, MD   150 mg at 10/22/14 7829    Lab Results:  No results found for this or any previous visit (from the past 48 hour(s)).  Physical Findings: AIMS: Facial and Oral Movements Muscles of Facial Expression: None, normal Lips and Perioral Area: None, normal Jaw: None, normal Tongue: None, normal,Extremity  Movements Upper (arms, wrists, hands, fingers): None, normal Lower (legs, knees, ankles, toes): None, normal, Trunk Movements Neck, shoulders, hips: None, normal, Overall Severity Severity of abnormal movements (highest score from questions above): None, normal Incapacitation due to abnormal movements: None, normal Patient's awareness of abnormal movements (rate only patient's report): No Awareness, Dental Status Current problems with teeth and/or dentures?: No Does patient usually wear dentures?: No  CIWA:  CIWA-Ar Total: 2 COWS:      Assessment- patient  Continues to improve and is becoming better related, better groomed, and presenting with a fuller range of affect. She is also less intensely focused/preoccupied on somatic preoccupations. Tolerating medications well. Insight into illness and importance of medication compliance limited . Would continue current medication regimen at this time.  Treatment Plan Summary: Continue medications as below Tegretol 200 mgrs BID for mood disorder Zoloft 150 mgrs QDAY for depression  Risperidone   3  mgrs QHS for psyhotic symptoms/mood disorder   Ativan 1 mgr Q  8 hours PRN severe anxiety Increase Benadryl to 50  mgrs QHS for Insomnnia. Treatment team working on disposition planning  Will  follow .      Medical Decision Making:  Established Problem, Stable/Improving (1), Review of Psycho-Social Stressors (1), Review or order clinical lab tests (1), Review of Medication Regimen & Side Effects (2) and Review of New Medication or Change in Dosage (2)   Neita Garnet , MD  10/22/2014, 4:53 PM

## 2014-10-22 NOTE — Progress Notes (Signed)
D: Patient is alert and oriented. Pt's mood and affect is depressed, anxious, and blunted. Pt denies SI/HI and AVH. Pt rates depression 5/10, hopelessness and anxiety 6/10. Pt complains of rash on posterior neck, a few small red bumps observed. Pt reports she did not sleep well last night. Pt reports taking Risperdal last night made her vomit. Pt is attending unit groups. A: MD [place] made aware of pt's concerns/complaints. Active listening by RN. Encouragement/Support provided to pt. Medication education reviewed with pt. Scheduled medications administered per providers orders (See MAR). 15 minute checks continued per protocol for patient safety.  R: Patient cooperative and receptive to nursing interventions. Pt remains safe.

## 2014-10-22 NOTE — Plan of Care (Signed)
Problem: Ineffective individual coping Goal: STG: Patient will remain free from self harm Outcome: Progressing Patient remains free from self harm. 15 minute checks continued per protocol for patient safety.   Problem: Alteration in mood Goal: LTG-Patient reports reduction in suicidal thoughts (Patient reports reduction in suicidal thoughts and is able to verbalize a safety plan for whenever patient is feeling suicidal)  Outcome: Progressing Patient denies having any suicidal thoughts today.  Problem: Diagnosis: Increased Risk For Suicide Attempt Goal: STG-Patient Will Attend All Groups On The Unit Outcome: Progressing Patient is attending unit groups today. Goal: STG-Patient Will Comply With Medication Regime Outcome: Progressing Patient has adhered to medication regimen today with ease.

## 2014-10-22 NOTE — Progress Notes (Signed)
D: Pt has anxious affect and mood.    Pt reports increased anxiety.  She reports her goal was "to talk to the doctor, try to get my sleep under control."  Pt denies SI/HI, denies hallucinations, denies pain.  Pt has been visible in milieu interacting with peers and staff appropriately.  Pt attended evening group.   A: Introduced self to pt.  Met with pt 1:1 and provided support and encouragement.  PRN medication administered for anxiety and sleep. R: Pt is compliant with medications.  Pt verbally contracts for safety.  Will continue to monitor and assess.

## 2014-10-23 NOTE — Progress Notes (Signed)
Patient ID: Latoya Green, female   DOB: 03-10-1956, 59 y.o.   MRN: [id number] [facility] MD Progress Note  10/23/2014 12:58 PM Drenda Sobecki  MRN:  [id number] Subjective:    Patient reports some anxiety. At this time states depression is better. She is hoping to be discharged soon. Does not endorse medication side effects. Recently had reported " restless legs" from Risperidone trial, but at this time denies and no akathisia is noted.     Objective:  Patient case discussed with treatment team and patient seen.  Patient reports partial improvement, but reports ongoing " anxiety". She does seem much better than upon admission, and as noted, grooming, relatedness are much improved and her delusional/negaitve ruminations have subsided significantly , although not completely resolved . Today she is more focused on discharge- she states her aunt is helping her with the rent so she can afford the house she is going to move in and is looking forward to returning to work- states she works from home. States this recent recurrence/exacerbation of her mental illness has also caused her to consider applying for disability, as it has been difficult for her to maintain employment in light of her psychiatric symptoms. Not currently endorsing medication side effects. Significantly, her sleep has improved. Slept  6 hours last night, whereas had been sleeping only 2-3 hours per night before. More visible in day room, no disruptive behaviors .   Principal Problem: Schizoaffective Disorder , Depressed  Diagnosis:   Patient Active Problem List   Diagnosis Date Noted  . Schizoaffective disorder, depressive type [F25.1] 10/15/2014  . Schizoaffective disorder [F25.9] 04/06/2014  . MDD (major depressive disorder), recurrent, severe, with psychosis [F33.3] 04/05/2014  . Major depression [F32.2] 02/28/2014  . MDD (major depressive disorder), single episode, severe with psychotic features [F32.3] 12/29/2013   Total Time  spent with patient: 20 minutes   Past Medical History:  Past Medical History  Diagnosis Date  . Depression   . Cancer     skin cancer 1997  . Epilepsy     1994    Past Surgical History  Procedure Laterality Date  . Skin ca removed     Family History: History reviewed. No pertinent family history. Social History:  History  Alcohol Use No     History  Drug Use No    History   Social History  . Marital Status: Divorced    Spouse Name: N/A  . Number of Children: N/A  . Years of Education: N/A   Social History Main Topics  . Smoking status: Current Every Day Smoker -- 1.00 packs/day for 25 years    Types: Cigarettes  . Smokeless tobacco: Not on file  . Alcohol Use: No  . Drug Use: No  . Sexual Activity: Not Currently    Birth Control/ Protection: Abstinence   Other Topics Concern  . None   Social History Narrative   Additional History:    Sleep:  Improved   Appetite:  Good   Assessment:   Musculoskeletal: Strength & Muscle Tone: within normal limits Gait & Station: normal Patient leans: N/A   Psychiatric Specialty Exam: Physical Exam  Vitals reviewed. Psychiatric: Her mood appears anxious.    Review of Systems  HENT: Negative.   Eyes: Negative.   Respiratory: Negative.   Cardiovascular: Negative.   Gastrointestinal: Negative.   Genitourinary: Negative.   Musculoskeletal: Negative.   Skin: Negative.   Neurological: Negative.   Endo/Heme/Allergies: Negative.   Psychiatric/Behavioral: Positive for depression.    Blood  pressure 127/78, pulse 78, temperature 98.4 F (36.9 C), temperature source Oral, resp. rate 18, height 5\' 7"  (1.702 m), weight 175 lb (79.379 kg).Body mass index is 27.4 kg/(m^2).  General Appearance:  Grooming is improved  Eye Contact:: improved   Speech:  Normal Rate  Volume:  Normal  Mood:   Improved compared to admission  Affect:     More reactive, somewhat anxious   Thought Process:  more linear and less perseverative  on somatic preoccupations/   Orientation:  Full (Time, Place, and Person)  Thought Content:  Denies hallucinations, not internally preoccupied, less somatically focused  Suicidal Thoughts:  At this time denies suicidal plan or intention  Homicidal Thoughts:  No- of note, denies any violent ideations towards aunt or anyone else   Memory:  Immediate;   Fair Recent;   Fair Remote;   Fair  Judgement:  Fair  Insight:  Fair  Psychomotor Activity:   More visible on the unit .  Concentration:  Good  Recall:  Good  Fund of Knowledge:Good  Language: Good  Akathisia:  Negative  Handed:  Right  AIMS (if indicated):     Assets:  Desire for Improvement Resilience Social Support  ADL's:  Intact  Cognition: WNL  Sleep:  Number of Hours: 6.25     Current Medications: Current Facility-Administered Medications  Medication Dose Route Frequency Provider Last Rate Last Dose  . acetaminophen (TYLENOL) tablet 650 mg  650 mg Oral Q6H PRN Harriet Butte, NP      . alum & mag hydroxide-simeth (MAALOX/MYLANTA) 200-200-20 MG/5ML suspension 30 mL  30 mL Oral Q4H PRN Harriet Butte, NP   30 mL at 10/21/14 1158  . carbamazepine (TEGRETOL XR) 12 hr tablet 200 mg  200 mg Oral BID Harriet Butte, NP   200 mg at 10/23/14 0744  . diphenhydrAMINE (BENADRYL) capsule 50 mg  50 mg Oral QHS PRN Jenne Campus, MD   50 mg at 10/22/14 2128  . loperamide (IMODIUM) capsule 2 mg  2 mg Oral PRN Encarnacion Slates, NP   2 mg at 10/15/14 1519  . LORazepam (ATIVAN) tablet 1 mg  1 mg Oral TID PRN Jenne Campus, MD   1 mg at 10/22/14 2021  . magnesium hydroxide (MILK OF MAGNESIA) suspension 30 mL  30 mL Oral Daily PRN Harriet Butte, NP      . nicotine (NICODERM CQ - dosed in mg/24 hours) patch 21 mg  21 mg Transdermal Daily Jenne Campus, MD   21 mg at 10/23/14 0745  . sertraline (ZOLOFT) tablet 150 mg  150 mg Oral Daily Jenne Campus, MD   150 mg at 10/23/14 1941    Lab Results:  No results found for this or any  previous visit (from the past 48 hour(s)).  Physical Findings: AIMS: Facial and Oral Movements Muscles of Facial Expression: None, normal Lips and Perioral Area: None, normal Jaw: None, normal Tongue: None, normal,Extremity Movements Upper (arms, wrists, hands, fingers): None, normal Lower (legs, knees, ankles, toes): None, normal, Trunk Movements Neck, shoulders, hips: None, normal, Overall Severity Severity of abnormal movements (highest score from questions above): None, normal Incapacitation due to abnormal movements: None, normal Patient's awareness of abnormal movements (rate only patient's report): No Awareness, Dental Status Current problems with teeth and/or dentures?: No Does patient usually wear dentures?: No  CIWA:  CIWA-Ar Total: 2 COWS:      Assessment- patient significantly improved compared to admission. Starting to focus more  on discharge and on future issues such as trying to maintain employment/ work, versus applying for disability. Persistent somatic delusions, on which she had been quite fixed upon admission, are  Now subsiding. No medication side effects at present. She is hoping for discharge soon.   Treatment Plan Summary: Continue medications as below Tegretol 200 mgrs BID for mood disorder Zoloft 150 mgrs QDAY for depression  Risperidone   3  mgrs QHS for psyhotic symptoms/mood disorder  Ativan 1 mgr Q  8 hours PRN severe anxiety Benadryl to 50  mgrs QHS for Insomnnia. Treatment team working on disposition planning        Medical Decision Making:  Established Problem, Stable/Improving (1), Review of Psycho-Social Stressors (1), Review or order clinical lab tests (1) and Review of Medication Regimen & Side Effects (2)   Neita Garnet , MD  10/23/2014, 12:58 PM

## 2014-10-23 NOTE — Progress Notes (Signed)
D: Patient is alert and oriented. Pt's mood and affect depressed, sad, and anxious. Pt denies SI/HI and AVH. Pt reports she does not know why the doctor removed her vistaril order. Pt reports she slept better last night that she has been sleeping recently. Pt rates depression 5/10, hopelessness 4/10, and anxiety 1/10. Pt is not attending unit groups today. Pt complains of heart burn/indigestion this afternoon with relief from PRN medication. A: MD Cobos made aware of pt's concerns. Active listening by RN. Encouragement/Support provided to pt. Medication education reviewed with pt. PRN medication administered for indigestion per providers orders (See MAR). Scheduled medications administered per providers orders (See MAR). 15 minute checks continued per protocol for patient safety.  R: Patient cooperative and receptive to nursing interventions. Pt remains safe.

## 2014-10-23 NOTE — Plan of Care (Signed)
Problem: Ineffective individual coping Goal: STG: Patient will remain free from self harm Outcome: Progressing Patient remains free from self harm. 15 minute checks continued per protocol for patient safety.   Problem: Alteration in mood Goal: LTG-Patient reports reduction in suicidal thoughts (Patient reports reduction in suicidal thoughts and is able to verbalize a safety plan for whenever patient is feeling suicidal)  Outcome: Progressing Patient denies having any suicidal thoughts today.  Problem: Diagnosis: Increased Risk For Suicide Attempt Goal: STG-Patient Will Comply With Medication Regime Outcome: Progressing Patient has adhered to medication regimen today with ease.

## 2014-10-23 NOTE — BHH Group Notes (Signed)
[facility] Group Notes:  (Clinical Social Work)  10/23/2014  10:30-11:00AM  Summary of Progress/Problems:   The main focus of today's process group was to   1)  discuss the importance of adding supports  2)  define health supports versus unhealthy supports  3)  identify the patient's current unhealthy supports and plan how to handle them  4)  Identify the patient's current healthy supports and plan what to add.  An emphasis was placed on using counselor, doctor, therapy groups, 12-step groups, and problem-specific support groups to expand supports.    The patient expressed full comprehension of the concepts presented, and agreed that there is a need to add more supports.  The patient stated her parents and her job are healthy supports.   Type of Therapy:  Process Group with Motivational Interviewing  Participation Level:  Minimal  Participation Quality:  Appropriate  Affect:  Flat  Cognitive:  Alert and Oriented  Insight:  Developing/Improving  Engagement in Therapy:  Limited  Modes of Intervention:   Education, Support and Processing, Activity  Briony Parveen A. [place], LCSW 10/23/2014

## 2014-10-23 NOTE — BHH Group Notes (Signed)
[facility] Group Notes:  (Nursing/MHT/Case Management/Adjunct)  Date:  10/23/2014  Time:  0900am  Type of Therapy:  Nurse Education  Participation Level:  Did Not Attend  Participation Quality:  Did not attend  Affect:  Did not attend  Cognitive:  Did not attend  Insight:  None  Engagement in Group:  Did not attend  Modes of Intervention:  Discussion, Education and Support  Summary of Progress/Problems: Patient was invited to group. Pt did not attend and remained in bed resting.  Charlyne Quale A 10/23/2014, 9:40 AM

## 2014-10-24 ENCOUNTER — Encounter (HOSPITAL_COMMUNITY): Payer: Self-pay | Admitting: *Deleted

## 2014-10-24 ENCOUNTER — Emergency Department (HOSPITAL_COMMUNITY)
Admission: EM | Admit: 2014-10-24 | Discharge: 2014-10-26 | Disposition: A | Discharge status: Home / Self Care | Payer: Federal, State, Local not specified - Other | Attending: Emergency Medicine | Admitting: Emergency Medicine

## 2014-10-24 DIAGNOSIS — Z8669 Personal history of other diseases of the nervous system and sense organs: Secondary | ICD-10-CM | POA: Insufficient documentation

## 2014-10-24 DIAGNOSIS — Z85828 Personal history of other malignant neoplasm of skin: Secondary | ICD-10-CM | POA: Insufficient documentation

## 2014-10-24 DIAGNOSIS — Z880 Allergy status to penicillin: Secondary | ICD-10-CM | POA: Insufficient documentation

## 2014-10-24 DIAGNOSIS — F251 Schizoaffective disorder, depressive type: Secondary | ICD-10-CM | POA: Diagnosis present

## 2014-10-24 DIAGNOSIS — F419 Anxiety disorder, unspecified: Secondary | ICD-10-CM | POA: Insufficient documentation

## 2014-10-24 DIAGNOSIS — Z79899 Other long term (current) drug therapy: Secondary | ICD-10-CM | POA: Insufficient documentation

## 2014-10-24 DIAGNOSIS — F333 Major depressive disorder, recurrent, severe with psychotic symptoms: Secondary | ICD-10-CM | POA: Diagnosis present

## 2014-10-24 DIAGNOSIS — F329 Major depressive disorder, single episode, unspecified: Secondary | ICD-10-CM | POA: Insufficient documentation

## 2014-10-24 DIAGNOSIS — R45851 Suicidal ideations: Secondary | ICD-10-CM

## 2014-10-24 DIAGNOSIS — Z720 Tobacco use: Secondary | ICD-10-CM | POA: Insufficient documentation

## 2014-10-24 HISTORY — DX: Anxiety disorder, unspecified: F41.9

## 2014-10-24 LAB — COMPREHENSIVE METABOLIC PANEL
ALBUMIN: 4 g/dL (ref 3.5–5.0)
ALK PHOS: 94 U/L (ref 38–126)
ALT: 23 U/L (ref 14–54)
AST: 29 U/L (ref 15–41)
Anion gap: 10 (ref 5–15)
BILIRUBIN TOTAL: 0.7 mg/dL (ref 0.3–1.2)
BUN: 16 mg/dL (ref 6–20)
CHLORIDE: 100 mmol/L — AB (ref 101–111)
CO2: 21 mmol/L — AB (ref 22–32)
CREATININE: 0.77 mg/dL (ref 0.44–1.00)
Calcium: 9.1 mg/dL (ref 8.9–10.3)
GFR calc Af Amer: >60 mL/min (ref >60)
Glucose, Bld: 198 mg/dL — ABNORMAL HIGH (ref 70–99)
POTASSIUM: 4.3 mmol/L (ref 3.5–5.1)
Sodium: 131 mmol/L — ABNORMAL LOW (ref 135–145)
Total Protein: 7.5 g/dL (ref 6.5–8.1)

## 2014-10-24 LAB — RAPID URINE DRUG SCREEN, HOSP PERFORMED
Amphetamines: NOT DETECTED
Barbiturates: NOT DETECTED
Benzodiazepines: NOT DETECTED
COCAINE: NOT DETECTED
OPIATES: NOT DETECTED
TETRAHYDROCANNABINOL: NOT DETECTED

## 2014-10-24 LAB — ACETAMINOPHEN LEVEL: Acetaminophen (Tylenol), Serum: <10 ug/mL — ABNORMAL LOW (ref 10–30)

## 2014-10-24 LAB — CBC
HCT: 43 % (ref 36.0–46.0)
Hemoglobin: 14 g/dL (ref 12.0–15.0)
MCH: 26.9 pg (ref 26.0–34.0)
MCHC: 32.6 g/dL (ref 30.0–36.0)
MCV: 82.5 fL (ref 78.0–100.0)
Platelets: 229 10*3/uL (ref 150–400)
RBC: 5.21 MIL/uL — ABNORMAL HIGH (ref 3.87–5.11)
RDW: 15.1 % (ref 11.5–15.5)
WBC: 20 10*3/uL — ABNORMAL HIGH (ref 4.0–10.5)

## 2014-10-24 LAB — SALICYLATE LEVEL: Salicylate Lvl: <4 mg/dL (ref 2.8–30.0)

## 2014-10-24 LAB — ETHANOL: Alcohol, Ethyl (B): <5 mg/dL (ref <5)

## 2014-10-24 MED ORDER — NICOTINE 21 MG/24HR TD PT24: 21.0000 mg | MEDICATED_PATCH | Freq: Every day | TRANSDERMAL | Status: DC

## 2014-10-24 MED ORDER — CARBAMAZEPINE ER 200 MG PO TB12: 200.0000 mg | ORAL_TABLET | Freq: Two times a day (BID) | ORAL | Status: DC

## 2014-10-24 MED ORDER — HYDROXYZINE HCL 50 MG PO TABS
50.0000 mg | ORAL_TABLET | Freq: Every day | ORAL | Status: DC
  Filled 2014-10-24: qty 1
  Filled 2014-10-24: qty 14

## 2014-10-24 MED ORDER — HYDROXYZINE HCL 50 MG PO TABS: 50.0000 mg | ORAL_TABLET | Freq: Every day | ORAL | Status: DC

## 2014-10-24 MED ORDER — SERTRALINE HCL 50 MG PO TABS: 150.0000 mg | ORAL_TABLET | Freq: Every day | ORAL | Status: DC

## 2014-10-24 MED ORDER — HYDROXYZINE HCL 25 MG PO TABS
25.0000 mg | ORAL_TABLET | Freq: Two times a day (BID) | ORAL | Status: DC | PRN
  Administered 2014-10-24: 25 mg via ORAL
  Filled 2014-10-24: qty 20
  Filled 2014-10-24: qty 1

## 2014-10-24 NOTE — ED Notes (Signed)
Pt states "I am very suicidal"; pt states that her plan is to overdose or use a knife to slit her throat; pt states that she saw another psych doctor today that was not her normal and she just can't get the thought of suicide out of her mind

## 2014-10-24 NOTE — ED Notes (Signed)
Pt given ginger ale to drink, pt aware we need urine sample.

## 2014-10-24 NOTE — Progress Notes (Signed)
  [facility] Adult Case Management Discharge Plan :  Will you be returning to the same living situation after discharge:  Yes,  Patient is returning to her home. At discharge, do you have transportation home?: Yes,  Patient will arrange transportation. Do you have the ability to pay for your medications: No.  Patient needs assistance with indigent medications.  Release of information consent forms completed and in the chart;  Patient's signature needed at discharge.  Patient to Follow up at: Follow-up Information    Follow up with [facility] On 10/27/2014.   Why:  You are scheduled with [facility] on Thursday, Oct 27, 2014 at 1 PM with Dr. Joesph July information:   [place]. [address] [place], [place]   [address]  [phone]      Patient denies SI/HI: Patient no longer endorsing SI/HI or other thoughts of self harm.   Safety Planning and Suicide Prevention discussed: .Reviewed with all patients during discharge planning group  Have you used any form of tobacco in the last 30 days? (Cigarettes, Smokeless Tobacco, Cigars, and/or Pipes): Yes  Has patient been referred to the Quitline?: Patient declined referral to Quitline.   Concha Pyo 10/24/2014, 9:43 AM

## 2014-10-24 NOTE — BHH Suicide Risk Assessment (Addendum)
[facility] Discharge Suicide Risk Assessment   Demographic Factors:  Living alone  Total Time spent with patient: 30 minutes  Musculoskeletal: Strength & Muscle Tone: within normal limits Gait & Station: normal Patient leans: N/A  Psychiatric Specialty Exam: Physical Exam  ROS  Blood pressure 113/66, pulse 90, temperature 98.1 F (36.7 C), temperature source Oral, resp. rate 18, height 5\' 7"  (1.702 m), weight 79.379 kg (175 lb).Body mass index is 27.4 kg/(m^2).  General Appearance: Casual  Eye Contact::  Fair  Speech:  Normal Rate409  Volume:  Normal  Mood:  Euthymic  Affect:  Constricted  Thought Process:  Coherent  Orientation:  Full (Time, Place, and Person)  Thought Content:  WDL  Suicidal Thoughts:  No  Homicidal Thoughts:  No  Memory:  Immediate;   Fair Recent;   Fair Remote;   Fair  Judgement:  Fair  Insight:  Shallow  Psychomotor Activity:  Normal  Concentration:  Fair  Recall:  [place]  Language: Fair  Akathisia:  No  Handed:  Right  AIMS (if indicated):     Assets:  [place] Talents/Skills Transportation Vocational/Educational  Sleep:  Number of Hours: 6.25  Cognition: WNL  ADL's:  Intact   Have you used any form of tobacco in the last 30 days? (Cigarettes, Smokeless Tobacco, Cigars, and/or Pipes): Yes  Has this patient used any form of tobacco in the last 30 days? (Cigarettes, Smokeless Tobacco, Cigars, and/or Pipes) Yes, will provide nicotine patch  Mental Status Per Nursing Assessment::   On Admission:  Suicidal ideation indicated by patient, Suicide plan  Current Mental Status by Physician: patient reports she is ready to go home. Denies SI/HI/AH/VH  Loss Factors: NA  Historical Factors: Impulsivity  Risk Reduction Factors:   Employed and Positive social support  Continued Clinical Symptoms:  Previous Psychiatric Diagnoses and Treatments  Cognitive Features That Contribute To Risk:   Polarized thinking    Suicide Risk:  Minimal: No identifiable suicidal ideation.  Patients presenting with no risk factors but with morbid ruminations; may be classified as minimal risk based on the severity of the depressive symptoms  Principal Problem: Schizoaffective disorder, depressive type Discharge Diagnoses:  Patient Active Problem List   Diagnosis Date Noted  . Schizoaffective disorder, depressive type [F25.1] 10/24/2014    Follow-up Information    Follow up with [facility] On 10/27/2014.   Why:  You are scheduled with [facility] on Thursday, Oct 27, 2014 at 1 PM with Dr. Joesph July information:   [place]. [address] [place], [place]   [address]  [phone]      Plan Of Care/Follow-up recommendations:  Activity:  No restrictions Diet:  regular Tests:  as needed Other:  follow up with after care  Is patient on multiple antipsychotic therapies at discharge:  No   Has Patient had three or more failed trials of antipsychotic monotherapy by history:  No  Recommended Plan for Multiple Antipsychotic Therapies: NA    Keziah Drotar md 10/24/2014, 10:17 AM

## 2014-10-24 NOTE — Progress Notes (Signed)
Recreation Therapy Notes  Date: 05.09.2016 Time: 9:30am Location: [address] Dayroom   Group Topic: Stress Management  Goal Area(s) Addresses:  Patient will actively participate in stress management techniques presented during session.   Behavioral Response: Did not attend.   Laureen Ochs Merleen Picazo, LRT/CTRS  Lane Hacker 10/24/2014 3:40 PM

## 2014-10-24 NOTE — ED Provider Notes (Signed)
CSN: [id number]     Arrival date & time 10/24/14  2056 History   First MD Initiated Contact with Patient 10/24/14 2301     Chief Complaint  Patient presents with  . Suicidal     (Consider location/radiation/quality/duration/timing/severity/associated sxs/prior Treatment) HPI 59 year old female presents to the emergency department with complaint of persistent suicidal ideations.  Patient was released from behavioral health earlier today.  She reports that she feels like she was released to early.  She has persistent intrusive thoughts of cutting herself or overdosing.  Patient reports that she is unable to continue living. Past Medical History  Diagnosis Date  . Depression   . Cancer     skin cancer 1997  . Epilepsy     1994  . Depression   . Anxiety    Past Surgical History  Procedure Laterality Date  . Skin ca removed     No family history on file. History  Substance Use Topics  . Smoking status: Current Every Day Smoker -- 1.00 packs/day for 25 years    Types: Cigarettes  . Smokeless tobacco: Not on file  . Alcohol Use: No   OB History    No data available     Review of Systems  See History of Present Illness; otherwise all other systems are reviewed and negative   Allergies  Keflex and Penicillins  Home Medications   Prior to Admission medications   Medication Sig Start Date End Date Taking? Authorizing Provider  carbamazepine (TEGRETOL XR) 200 MG 12 hr tablet Take 1 tablet (200 mg total) by mouth 2 (two) times daily. 10/24/14  Yes Niel Hummer, NP  hydrOXYzine (ATARAX/VISTARIL) 50 MG tablet Take 1 tablet (50 mg total) by mouth at bedtime. 10/24/14  Yes Niel Hummer, NP  loperamide (IMODIUM A-D) 2 MG tablet Take 2 mg by mouth 4 (four) times daily as needed for diarrhea or loose stools.   Yes Historical Provider, MD  nicotine (NICODERM CQ - DOSED IN MG/24 HOURS) 21 mg/24hr patch Place 1 patch (21 mg total) onto the skin daily. 10/24/14  Yes Niel Hummer, NP  OVER  THE COUNTER MEDICATION Take 1 tablet by mouth daily as needed (diarrhea).   Yes Historical Provider, MD  sertraline (ZOLOFT) 50 MG tablet Take 3 tablets (150 mg total) by mouth daily. 10/24/14  Yes Niel Hummer, NP  hydrOXYzine (ATARAX/VISTARIL) 25 MG tablet Take 1 tablet (25 mg total) by mouth every 6 (six) hours as needed for anxiety. Patient not taking: Reported on 10/24/2014 04/11/14   Kerrie Buffalo, NP   BP 136/101 mmHg  Pulse 111  Temp(Src) 98.8 F (37.1 C) (Oral)  Resp 16  SpO2 100% Physical Exam  Constitutional: She is oriented to person, place, and time. She appears well-developed and well-nourished.  HENT:  Head: Normocephalic and atraumatic.  Nose: Nose normal.  Mouth/Throat: Oropharynx is clear and moist.  Eyes: Conjunctivae and EOM are normal. Pupils are equal, round, and reactive to light.  Neck: Normal range of motion. Neck supple. No JVD present. No tracheal deviation present. No thyromegaly present.  Cardiovascular: Normal rate, regular rhythm, normal heart sounds and intact distal pulses.  Exam reveals no gallop and no friction rub.   No murmur heard. Pulmonary/Chest: Effort normal and breath sounds normal. No stridor. No respiratory distress. She has no wheezes. She has no rales. She exhibits no tenderness.  Abdominal: Soft. Bowel sounds are normal. She exhibits no distension and no mass. There is no tenderness. There is  no rebound and no guarding.  Musculoskeletal: Normal range of motion. She exhibits no edema or tenderness.  Lymphadenopathy:    She has no cervical adenopathy.  Neurological: She is alert and oriented to person, place, and time. She displays normal reflexes. She exhibits normal muscle tone. Coordination normal.  Skin: Skin is warm and dry. No rash noted. No erythema. No pallor.  Psychiatric:  Flat affect, suicidal ideation.  Poor insight and judgment.  Nursing note and vitals reviewed.   ED Course  Procedures (including critical care time) Labs  Review Labs Reviewed  ACETAMINOPHEN LEVEL - Abnormal; Notable for the following:    Acetaminophen (Tylenol), Serum <10 (*)    All other components within normal limits  CBC - Abnormal; Notable for the following:    WBC 20.0 (*)    RBC 5.21 (*)    All other components within normal limits  COMPREHENSIVE METABOLIC PANEL - Abnormal; Notable for the following:    Sodium 131 (*)    Chloride 100 (*)    CO2 21 (*)    Glucose, Bld 198 (*)    All other components within normal limits  ETHANOL  SALICYLATE LEVEL  URINE RAPID DRUG SCREEN (HOSP PERFORMED)    Imaging Review No results found.   EKG Interpretation None      MDM   Final diagnoses:  Suicidal ideation   59 year old female with severe depression and persistent suicidal ideation.  She was released earlier today.  Plan for TTS, and readmission to a psychiatric facility.    Linton Flemings, MD 10/25/14 [phone]

## 2014-10-24 NOTE — Plan of Care (Signed)
Problem: Consults Goal: Depression Patient Education See Patient Education Module for education specifics.  Outcome: Completed/Met Date Met:  10/24/14 Nurse discussed depression/coping skills with patient.
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
## 2014-10-24 NOTE — [facility] Group Notes (Signed)
[facility] LCSW Aftercare Discharge Planning Group Note   10/24/2014 9:42 AM    Participation Quality:  Appropraite  Mood/Affect:  Appropriate  Depression Rating:  1  Anxiety Rating:  1  Thoughts of Suicide:  No  Will you contract for safety?   NA  Current AVH:  No  Plan for Discharge/Comments:  Patient attended discharge planning group and actively participated in group. She reports being much better and ready to discharge home today.  She will follow up with [facility]. Suicide prevention education reviewed and SPE document provided.   Transportation Means: Patient has transportation.   Supports:  Patient has a support system.   Latoya Green, Latoya Green Post

## 2014-10-24 NOTE — ED Notes (Signed)
Pt crying in hallway, stating "I can't live like this".

## 2014-10-24 NOTE — Progress Notes (Signed)
D: Pt has anxious affect and mood.  Pt reports "it was sort of hard talking to my aunt, she mixes up things.  She almost accuse me of cheating money, which I never did."  Pt denies SI/HI, denies hallucinations, denies pain.  Pt has been visible in milieu interacting with peers and staff appropriately.  Pt attended evening group.   A:  Met with pt 1:1 and provided support and encouragement.  PRN medication administered for anxiety and sleep. R: Pt is compliant with medications.  Pt verbally contracts for safety.  Will continue to monitor and assess.

## 2014-10-24 NOTE — Progress Notes (Signed)
Discharge Note:  Patient denied SI and HI.  Denied A/V hallucinations.  Suicide prevention information given and discussed with patient who stated she understood and had no questions.  Patient stated she received all her belongings, clothing, misc items, toiletries, prescriptions, medications.  Patient stated she appreciated all assistance received from [facility] staff.

## 2014-10-24 NOTE — Progress Notes (Signed)
D:  Patient's self inventory sheet, patient sleeps good, sleep medication is helpful.  Good appetite, normal energy level, good concentration.  Rated depression 5, hopeless 4, anxiety 2.  Denied withdrawals.  Denied SI.  Denied physical problems.  Denied pain.  Goal is to find assisted living, senior living.  Plans to talk to SW.  Has high anxiety.  Does have discharge plans.  No problems anticipated after discharge. A:  Medications administered per MD orders.  Emotional support and encouragement given patient. R:  Denied SI and HI, contracts for safety.  Denied A/V hallucinations.  Safety maintained with 15 minute checks. Patient is looking forward to discharge today.

## 2014-10-24 NOTE — ED Notes (Signed)
Pt pacing in hallway, stating "I'm scared", pt anxious.

## 2014-10-25 DIAGNOSIS — F251 Schizoaffective disorder, depressive type: Secondary | ICD-10-CM

## 2014-10-25 DIAGNOSIS — R45851 Suicidal ideations: Secondary | ICD-10-CM

## 2014-10-25 MED ORDER — CARBAMAZEPINE ER 200 MG PO TB12
200.0000 mg | ORAL_TABLET | Freq: Two times a day (BID) | ORAL | Status: DC
  Administered 2014-10-25 – 2014-10-26 (×4): 200 mg via ORAL
  Filled 2014-10-25 (×5): qty 1

## 2014-10-25 MED ORDER — ACETAMINOPHEN 325 MG PO TABS: 650.0000 mg | ORAL_TABLET | ORAL | Status: DC | PRN

## 2014-10-25 MED ORDER — SERTRALINE HCL 50 MG PO TABS
150.0000 mg | ORAL_TABLET | Freq: Every day | ORAL | Status: DC
Start: 2014-10-25 — End: 2014-10-26
  Administered 2014-10-25 – 2014-10-26 (×2): 150 mg via ORAL
  Filled 2014-10-25 (×2): qty 3

## 2014-10-25 MED ORDER — LOPERAMIDE HCL 2 MG PO CAPS
4.0000 mg | ORAL_CAPSULE | Freq: Once | ORAL | Status: AC
  Administered 2014-10-25: 4 mg via ORAL
  Filled 2014-10-25: qty 2

## 2014-10-25 MED ORDER — IBUPROFEN 200 MG PO TABS: 600.0000 mg | ORAL_TABLET | Freq: Three times a day (TID) | ORAL | Status: DC | PRN

## 2014-10-25 MED ORDER — NICOTINE 21 MG/24HR TD PT24
21.0000 mg | MEDICATED_PATCH | Freq: Every day | TRANSDERMAL | Status: DC
  Administered 2014-10-25: 21 mg via TRANSDERMAL
  Filled 2014-10-25 (×2): qty 1

## 2014-10-25 MED ORDER — ONDANSETRON HCL 4 MG PO TABS: 4.0000 mg | ORAL_TABLET | Freq: Three times a day (TID) | ORAL | Status: DC | PRN

## 2014-10-25 MED ORDER — HYDROXYZINE HCL 25 MG PO TABS: 25.0000 mg | ORAL_TABLET | Freq: Four times a day (QID) | ORAL | Status: DC | PRN

## 2014-10-25 MED ORDER — ZOLPIDEM TARTRATE 5 MG PO TABS
5.0000 mg | ORAL_TABLET | Freq: Every evening | ORAL | Status: DC | PRN
  Administered 2014-10-25 (×2): 5 mg via ORAL
  Filled 2014-10-25 (×2): qty 1

## 2014-10-25 MED ORDER — HYDROXYZINE HCL 25 MG PO TABS
50.0000 mg | ORAL_TABLET | Freq: Every day | ORAL | Status: DC
  Administered 2014-10-25: 50 mg via ORAL
  Filled 2014-10-25: qty 2
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
## 2014-10-25 NOTE — BH Assessment (Addendum)
LCSW consulted with Patriciaann Clan, PA who states that she meets criteria for inpatient and to seek placement at other facilities due to multiple admissions to [facility], patient may benefit from another treatment facility.

## 2014-10-25 NOTE — BH Assessment (Signed)
[facility] Assessment Progress Note  The following facilities have been contacted to seek placement for this patient with results as noted:  Beds available, information sent, decision pending:  [facility]  Beds available, fax would not connect:  [facility]  No beds available but accepting referrals for future consideration:  [facility]  At capacity:  [facility]  Call rolled to voice mail, left message:  Ginny Forth, [place] Triage Specialist [phone]

## 2014-10-25 NOTE — Progress Notes (Addendum)
Patient referral packet was faxed to the following facilities for admission:  1. [facility] [place], [place], [place] [address] [phone]  LCSW contacted [facility] to check the bed availability and status of referral and states that there are beds available but the physicians at [facility] must call to consult before a transfer can be considered.   [facility] [address] [place], [place] [address] [phone]   LCSW contacted the facility to check on bed availability and there was no answer. LCSW left a message requesting a callback for bed availability.   3. [facility] [place], [place], [place] [address] [phone] or [phone]  LCSW contacted the facility and  Was transferred to a line with no answer and no option to leave a message.    [facility] [address] [place], [place], [place] [address] [phone]  LCSW spoke with Merrily Pew in [place] who transferred me to [facility] who reports that there are no beds available with possible 8-10 discharged, 3 beds are high acuity, and the rest are moderate to low acuity, and one geriatric bed may be available tomorrow.  Raford Pitcher reports that the Physicians usually comes about mid morning, Geriatric physician usually comes about 6am to complete discharges. Barb recommends sending referrals in the morning for consideration. LCSW informed her that the referral had already been faxed over.     [facility] [place], [place], [place] [address] [phone]   LCSW spoke with [facility] who reports that there are two beds available and she has a large stack of referrals that she has not had time to look through and will attempt to go through those referrals by 6am.

## 2014-10-25 NOTE — ED Notes (Signed)
Patient resting quietly in bed with eyes closed, Respirations equal and unlabored, skin warm and dry, NAD. No change in assessment or acuity. Q 15 minute safety checks remain in place.   

## 2014-10-25 NOTE — Consult Note (Signed)
[facility] Consult   Reason for Consult:  Suicidal ideations Referring Physician:  EDP Patient Identification: Latoya Green MRN:  [id number] Principal Diagnosis: Schizoaffective disorder, depressive type Diagnosis:   Patient Active Problem List   Diagnosis Date Noted  . Suicidal ideation [R45.851] 10/25/2014    Priority: High  . Schizoaffective disorder, depressive type [F25.1] 10/24/2014    Priority: High    Total Time spent with patient: 45 minutes  Subjective:   Latoya Green is a 59 y.o. female patient admitted with suicidal ideations.  HPI:  The patient left [facility] and came to the ED the same day.  She stated she was not ready to discharge and should not have told the psychiatrist she was.  Latoya Green reports suicidal ideations constantly with a plan to overdose or cut herself with a knife.  One past attempt 30 years ago by an overdose.  She is upset because her aunt is trying to get POA over her due to her filing disability.  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  Denies suicidal/homicidal ideations, hallucinations, and alcohol/drug abuse issues.   HPI Elements:   Location:  generalized. Quality:  acute. Severity:  severe. Timing:  constant. Duration:  weeks. Context:  stressors.  Past Medical History:  Past Medical History  Diagnosis Date  . Depression   . Cancer     skin cancer 1997  . Epilepsy     1994  . Depression   . Anxiety     Past Surgical History  Procedure Laterality Date  . Skin ca removed     Family History: No family history on file. Social History:  History  Alcohol Use No     History  Drug Use No    History   Social History  . Marital Status: Divorced    Spouse Name: N/A  . Number of Children: N/A  . Years of Education: N/A   Social History Main Topics  . Smoking status: Current Every Day Smoker -- 1.00 packs/day for 25 years    Types: Cigarettes  . Smokeless tobacco: Not on file  . Alcohol  Use: No  . Drug Use: No  . Sexual Activity: Not Currently    Birth Control/ Protection: Abstinence   Other Topics Concern  . None   Social History Narrative   Additional Social History:    Pain Medications: See MAR Prescriptions: See MAR Over the Counter: See MAR History of alcohol / drug use?: No history of alcohol / drug abuse                     Allergies:   Allergies  Allergen Reactions  . Keflex [Cephalexin] Swelling    Pain in her throught.  . Penicillins Other (See Comments)    Childhood allergy    Labs:  Results for orders placed or performed during the hospital encounter of 10/24/14 (from the past 48 hour(s))  Acetaminophen level     Status: Abnormal   Collection Time: 10/24/14 10:08 PM  Result Value Ref Range   Acetaminophen (Tylenol), Serum <10 (L) 10 - 30 ug/mL    Comment:        THERAPEUTIC CONCENTRATIONS VARY SIGNIFICANTLY. A RANGE OF 10-30 ug/mL MAY BE AN EFFECTIVE CONCENTRATION FOR MANY PATIENTS. HOWEVER, SOME ARE BEST TREATED AT CONCENTRATIONS OUTSIDE THIS RANGE. ACETAMINOPHEN CONCENTRATIONS >150 ug/mL AT 4 HOURS AFTER INGESTION AND >50 ug/mL AT 12 HOURS AFTER INGESTION ARE OFTEN ASSOCIATED WITH TOXIC REACTIONS.  CBC     Status: Abnormal   Collection Time: 10/24/14 10:08 PM  Result Value Ref Range   WBC 20.0 (H) 4.0 - 10.5 K/uL   RBC 5.21 (H) 3.87 - 5.11 MIL/uL   Hemoglobin 14.0 12.0 - 15.0 g/dL   HCT 43.0 36.0 - 46.0 %   MCV 82.5 78.0 - 100.0 fL   MCH 26.9 26.0 - 34.0 pg   MCHC 32.6 30.0 - 36.0 g/dL   RDW 15.1 11.5 - 15.5 %   Platelets 229 150 - 400 K/uL  Comprehensive metabolic panel     Status: Abnormal   Collection Time: 10/24/14 10:08 PM  Result Value Ref Range   Sodium 131 (L) 135 - 145 mmol/L   Potassium 4.3 3.5 - 5.1 mmol/L   Chloride 100 (L) 101 - 111 mmol/L   CO2 21 (L) 22 - 32 mmol/L   Glucose, Bld 198 (H) 70 - 99 mg/dL   BUN 16 6 - 20 mg/dL   Creatinine, Ser 0.77 0.44 - 1.00 mg/dL   Calcium 9.1 8.9 - 10.3  mg/dL   Total Protein 7.5 6.5 - 8.1 g/dL   Albumin 4.0 3.5 - 5.0 g/dL   AST 29 15 - 41 U/L   ALT 23 14 - 54 U/L   Alkaline Phosphatase 94 38 - 126 U/L   Total Bilirubin 0.7 0.3 - 1.2 mg/dL   GFR calc non Af Amer >60 >60 mL/min   GFR calc Af Amer >60 >60 mL/min    Comment: (NOTE) The eGFR has been calculated using the CKD EPI equation. This calculation has not been validated in all clinical situations. eGFR's persistently <60 mL/min signify possible Chronic Kidney Disease.    Anion gap 10 5 - 15  Ethanol (ETOH)     Status: None   Collection Time: 10/24/14 10:08 PM  Result Value Ref Range   Alcohol, Ethyl (B) <5 <5 mg/dL    Comment:        LOWEST DETECTABLE LIMIT FOR SERUM ALCOHOL IS 11 mg/dL FOR MEDICAL PURPOSES ONLY   Salicylate level     Status: None   Collection Time: 10/24/14 10:08 PM  Result Value Ref Range   Salicylate Lvl <4.0 2.8 - 30.0 mg/dL  Urine Drug Screen     Status: None   Collection Time: 10/24/14 10:26 PM  Result Value Ref Range   Opiates NONE DETECTED NONE DETECTED   Cocaine NONE DETECTED NONE DETECTED   Benzodiazepines NONE DETECTED NONE DETECTED   Amphetamines NONE DETECTED NONE DETECTED   Tetrahydrocannabinol NONE DETECTED NONE DETECTED   Barbiturates NONE DETECTED NONE DETECTED    Comment:        DRUG SCREEN FOR MEDICAL PURPOSES ONLY.  IF CONFIRMATION IS NEEDED FOR ANY PURPOSE, NOTIFY LAB WITHIN 5 DAYS.        LOWEST DETECTABLE LIMITS FOR URINE DRUG SCREEN Drug Class       Cutoff (ng/mL) Amphetamine      1000 Barbiturate      200 Benzodiazepine   200 Tricyclics       300 Opiates          300 Cocaine          300 THC              50     Vitals: Blood pressure 133/70, pulse 95, temperature 97.5 F (36.4 C), temperature source Oral, resp. rate 20, SpO2 99 %.  Risk to Self: Suicidal Ideation: Yes-Currently Present Suicidal Intent: Yes-Currently Present   Is patient at risk for suicide?: Yes Suicidal Plan?: Yes-Currently Present Specify  Current Suicidal Plan: cut throat, overdose Access to Means: Yes Specify Access to Suicidal Means: Knife, pills What has been your use of drugs/alcohol within the last 12 months?: None How many times?: 1 Other Self Harm Risks: None Triggers for Past Attempts: Other (Comment) (health declining) Intentional Self Injurious Behavior: None Risk to Others: Homicidal Ideation: No Thoughts of Harm to Others: No Current Homicidal Intent: No Current Homicidal Plan: No Access to Homicidal Means: No Identified Victim: N/A History of harm to others?: No Assessment of Violence: None Noted Violent Behavior Description: N/A Does patient have access to weapons?: Yes (Comment) (knives) Criminal Charges Pending?: No Does patient have a court date: No Prior Inpatient Therapy: Prior Inpatient Therapy: Yes Prior Therapy Dates: 10/2014 Prior Therapy Facilty/Provider(s): [facility] Reason for Treatment: Suicidal Prior Outpatient Therapy: Prior Outpatient Therapy: Yes Prior Therapy Dates: [facility] Prior Therapy Facilty/Provider(s): Dr. Williford Reason for Treatment: Depression Does patient have an ACCT team?: No Does patient have Intensive In-House Services?  : No Does patient have [facility] services? : No Does patient have P4CC services?: No  Current Facility-Administered Medications  Medication Dose Route Frequency Provider Last Rate Last Dose  . acetaminophen (TYLENOL) tablet 650 mg  650 mg Oral Q4H PRN Olga Otter, MD      . carbamazepine (TEGRETOL XR) 12 hr tablet 200 mg  200 mg Oral BID Olga Otter, MD   200 mg at 10/25/14 1032  . hydrOXYzine (ATARAX/VISTARIL) tablet 25 mg  25 mg Oral Q6H PRN Jamison Y Lord, NP      . hydrOXYzine (ATARAX/VISTARIL) tablet 50 mg  50 mg Oral QHS Jamison Y Lord, NP      . ibuprofen (ADVIL,MOTRIN) tablet 600 mg  600 mg Oral Q8H PRN Olga Otter, MD      . nicotine (NICODERM CQ - dosed in mg/24 hours) patch 21 mg  21 mg Transdermal Daily Olga Otter, MD   21 mg at 10/25/14 1038  .  ondansetron (ZOFRAN) tablet 4 mg  4 mg Oral Q8H PRN Olga Otter, MD      . sertraline (ZOLOFT) tablet 150 mg  150 mg Oral Daily Jamison Y Lord, NP      . zolpidem (AMBIEN) tablet 5 mg  5 mg Oral QHS PRN Olga Otter, MD   5 mg at 10/25/14 0109   Current Outpatient Prescriptions  Medication Sig Dispense Refill  . carbamazepine (TEGRETOL XR) 200 MG 12 hr tablet Take 1 tablet (200 mg total) by mouth 2 (two) times daily. 60 tablet 0  . hydrOXYzine (ATARAX/VISTARIL) 50 MG tablet Take 1 tablet (50 mg total) by mouth at bedtime. 30 tablet 0  . loperamide (IMODIUM A-D) 2 MG tablet Take 2 mg by mouth 4 (four) times daily as needed for diarrhea or loose stools.    . nicotine (NICODERM CQ - DOSED IN MG/24 HOURS) 21 mg/24hr patch Place 1 patch (21 mg total) onto the skin daily. 28 patch 0  . OVER THE COUNTER MEDICATION Take 1 tablet by mouth daily as needed (diarrhea).    . sertraline (ZOLOFT) 50 MG tablet Take 3 tablets (150 mg total) by mouth daily. 90 tablet 0  . hydrOXYzine (ATARAX/VISTARIL) 25 MG tablet Take 1 tablet (25 mg total) by mouth every 6 (six) hours as needed for anxiety. (Patient not taking: Reported on 10/24/2014) 30 tablet 0    Musculoskeletal: Strength & Muscle Tone: within normal limits Gait & Station: normal Patient leans:   N/A  Psychiatric Specialty Exam:     Blood pressure 133/70, pulse 95, temperature 97.5 F (36.4 C), temperature source Oral, resp. rate 20, SpO2 99 %.There is no weight on file to calculate BMI.  General Appearance: Disheveled  Eye Sport and exercise psychologist::  Fair  Speech:  Normal Rate  Volume:  Normal  Mood:  Depressed  Affect:  Congruent  Thought Process:  Coherent  Orientation:  Full (Time, Place, and Person)  Thought Content:  Rumination  Suicidal Thoughts:  Yes.  with intent/plan  Homicidal Thoughts:  No  Memory:  Immediate;   Fair Recent;   Fair Remote;   Fair  Judgement:  Fair  Insight:  Fair  Psychomotor Activity:  Decreased  Concentration:  Fair  Recall:   [employer] of Knowledge:Good  Language: Good  Akathisia:  No  Handed:  Right  AIMS (if indicated):     Assets:  Leisure Time Physical Health Resilience  ADL's:  Intact  Cognition: WNL  Sleep:      Medical Decision Making: Review of Psycho-Social Stressors (1), Review or order clinical lab tests (1) and Review of Medication Regimen & Side Effects (2)  Treatment Plan Summary: Daily contact with patient to assess and evaluate symptoms and progress in treatment, Medication management and Plan admit to inpatient psychiatry for stabilization  Plan:  Recommend psychiatric Inpatient admission when medically cleared. Disposition: Johny Sax, [place] 10/25/2014 3:45 PM Patient seen face-to-face for psychiatric evaluation, chart reviewed and case discussed with the physician extender and developed treatment plan. Reviewed the information documented and agree with the treatment plan. Corena Pilgrim, MD

## 2014-10-26 NOTE — Consult Note (Signed)
[facility] Psychiatry Consult   Reason for Consult:  Suicidal ideations Referring Physician:  EDP Patient Identification: Latoya Green MRN:  [id number] Principal Diagnosis: Schizoaffective disorder, depressive type Diagnosis:   Patient Active Problem List   Diagnosis Date Noted  . Suicidal ideation [R45.851] 10/25/2014    Priority: High  . Schizoaffective disorder, depressive type [F25.1] 10/24/2014    Priority: High    Total Time spent with patient: 30 minutes  Subjective:   Latoya Green is a 59 y.o. female patient admitted has stabilized  HPI:  The patient requested to leave to get back to her job and her aunt wants her to come home.  She denies suicidal/homicidal ideations, hallucinations, and alcohol/drug issues.  Dorla also wants to make her medical appointments that are scheduled for today and tomorrow.  Her main concern was the air condition in her rental house where her aunt is the landlord.  Evidently, they increased the air condition during her stay. HPI Elements:   Location:  generalized. Quality:  acute. Severity:  severe. Timing:  constant. Duration:  weeks. Context:  stressors.  Past Medical History:  Past Medical History  Diagnosis Date  . Depression   . Cancer     skin cancer 1997  . Epilepsy     1994  . Depression   . Anxiety     Past Surgical History  Procedure Laterality Date  . Skin ca removed     Family History: No family history on file. Social History:  History  Alcohol Use No     History  Drug Use No    History   Social History  . Marital Status: Divorced    Spouse Name: N/A  . Number of Children: N/A  . Years of Education: N/A   Social History Main Topics  . Smoking status: Current Every Day Smoker -- 1.00 packs/day for 25 years    Types: Cigarettes  . Smokeless tobacco: Not on file  . Alcohol Use: No  . Drug Use: No  . Sexual Activity: Not Currently    Birth Control/ Protection: Abstinence   Other Topics  Concern  . None   Social History Narrative   Additional Social History:    Pain Medications: See MAR Prescriptions: See MAR Over the Counter: See MAR History of alcohol / drug use?: No history of alcohol / drug abuse                     Allergies:   Allergies  Allergen Reactions  . Keflex [Cephalexin] Swelling    Pain in her throught.  . Penicillins Other (See Comments)    Childhood allergy    Labs:  Results for orders placed or performed during the hospital encounter of 10/24/14 (from the past 48 hour(s))  Acetaminophen level     Status: Abnormal   Collection Time: 10/24/14 10:08 PM  Result Value Ref Range   Acetaminophen (Tylenol), Serum <10 (L) 10 - 30 ug/mL    Comment:        THERAPEUTIC CONCENTRATIONS VARY SIGNIFICANTLY. A RANGE OF 10-30 ug/mL MAY BE AN EFFECTIVE CONCENTRATION FOR MANY PATIENTS. HOWEVER, SOME ARE BEST TREATED AT CONCENTRATIONS OUTSIDE THIS RANGE. ACETAMINOPHEN CONCENTRATIONS >150 ug/mL AT 4 HOURS AFTER INGESTION AND >50 ug/mL AT 12 HOURS AFTER INGESTION ARE OFTEN ASSOCIATED WITH TOXIC REACTIONS.   CBC     Status: Abnormal   Collection Time: 10/24/14 10:08 PM  Result Value Ref Range   WBC 20.0 (H) 4.0 - 10.5 K/uL  RBC 5.21 (H) 3.87 - 5.11 MIL/uL   Hemoglobin 14.0 12.0 - 15.0 g/dL   HCT 43.0 36.0 - 46.0 %   MCV 82.5 78.0 - 100.0 fL   MCH 26.9 26.0 - 34.0 pg   MCHC 32.6 30.0 - 36.0 g/dL   RDW 15.1 11.5 - 15.5 %   Platelets 229 150 - 400 K/uL  Comprehensive metabolic panel     Status: Abnormal   Collection Time: 10/24/14 10:08 PM  Result Value Ref Range   Sodium 131 (L) 135 - 145 mmol/L   Potassium 4.3 3.5 - 5.1 mmol/L   Chloride 100 (L) 101 - 111 mmol/L   CO2 21 (L) 22 - 32 mmol/L   Glucose, Bld 198 (H) 70 - 99 mg/dL   BUN 16 6 - 20 mg/dL   Creatinine, Ser 0.77 0.44 - 1.00 mg/dL   Calcium 9.1 8.9 - 10.3 mg/dL   Total Protein 7.5 6.5 - 8.1 g/dL   Albumin 4.0 3.5 - 5.0 g/dL   AST 29 15 - 41 U/L   ALT 23 14 - 54 U/L    Alkaline Phosphatase 94 38 - 126 U/L   Total Bilirubin 0.7 0.3 - 1.2 mg/dL   GFR calc non Af Amer >60 >60 mL/min   GFR calc Af Amer >60 >60 mL/min    Comment: (NOTE) The eGFR has been calculated using the CKD EPI equation. This calculation has not been validated in all clinical situations. eGFR's persistently <60 mL/min signify possible Chronic Kidney Disease.    Anion gap 10 5 - 15  Ethanol (ETOH)     Status: None   Collection Time: 10/24/14 10:08 PM  Result Value Ref Range   Alcohol, Ethyl (B) <5 <5 mg/dL    Comment:        LOWEST DETECTABLE LIMIT FOR SERUM ALCOHOL IS 11 mg/dL FOR MEDICAL PURPOSES ONLY   Salicylate level     Status: None   Collection Time: 10/24/14 10:08 PM  Result Value Ref Range   Salicylate Lvl <5.9 2.8 - 30.0 mg/dL  Urine Drug Screen     Status: None   Collection Time: 10/24/14 10:26 PM  Result Value Ref Range   Opiates NONE DETECTED NONE DETECTED   Cocaine NONE DETECTED NONE DETECTED   Benzodiazepines NONE DETECTED NONE DETECTED   Amphetamines NONE DETECTED NONE DETECTED   Tetrahydrocannabinol NONE DETECTED NONE DETECTED   Barbiturates NONE DETECTED NONE DETECTED    Comment:        DRUG SCREEN FOR MEDICAL PURPOSES ONLY.  IF CONFIRMATION IS NEEDED FOR ANY PURPOSE, NOTIFY LAB WITHIN 5 DAYS.        LOWEST DETECTABLE LIMITS FOR URINE DRUG SCREEN Drug Class       Cutoff (ng/mL) Amphetamine      1000 Barbiturate      200 Benzodiazepine   977 Tricyclics       414 Opiates          300 Cocaine          300 THC              50     Vitals: Blood pressure 108/54, pulse 77, temperature 98.1 F (36.7 C), temperature source Oral, resp. rate 18, SpO2 98 %.  Risk to Self: Suicidal Ideation: Yes-Currently Present Suicidal Intent: Yes-Currently Present Is patient at risk for suicide?: Yes Suicidal Plan?: Yes-Currently Present Specify Current Suicidal Plan: cut throat, overdose Access to Means: Yes Specify Access to Suicidal Means: Knife, pills What  has been your use of drugs/alcohol within the last 12 months?: None How many times?: 1 Other Self Harm Risks: None Triggers for Past Attempts: Other (Comment) (health declining) Intentional Self Injurious Behavior: None Risk to Others: Homicidal Ideation: No Thoughts of Harm to Others: No Current Homicidal Intent: No Current Homicidal Plan: No Access to Homicidal Means: No Identified Victim: N/A History of harm to others?: No Assessment of Violence: None Noted Violent Behavior Description: N/A Does patient have access to weapons?: Yes (Comment) (knives) Criminal Charges Pending?: No Does patient have a court date: No Prior Inpatient Therapy: Prior Inpatient Therapy: Yes Prior Therapy Dates: 10/2014 Prior Therapy Facilty[facility]Provider([facility]): [facility] Reason for Treatment: Suicidal Prior Outpatient Therapy: Prior Outpatient Therapy: Yes Prior Therapy Dates: [facility] Prior Therapy Facilty/Provider(s): Dr. Rhona Raider Reason for Treatment: Depression Does patient have an ACCT team?: No Does patient have Intensive In-House Services?  : No Does patient have [facility] services? : No Does patient have P4CC services?: No  Current Facility-Administered Medications  Medication Dose Route Frequency Provider Last Rate Last Dose  . acetaminophen (TYLENOL) tablet 650 mg  650 mg Oral Q4H PRN Linton Flemings, MD      . carbamazepine (TEGRETOL XR) 12 hr tablet 200 mg  200 mg Oral BID Linton Flemings, MD   200 mg at 10/26/14 0954  . hydrOXYzine (ATARAX/VISTARIL) tablet 25 mg  25 mg Oral Q6H PRN Patrecia Pour, NP      . hydrOXYzine (ATARAX/VISTARIL) tablet 50 mg  50 mg Oral QHS Patrecia Pour, NP   50 mg at 10/25/14 2123  . ibuprofen (ADVIL,MOTRIN) tablet 600 mg  600 mg Oral Q8H PRN Linton Flemings, MD      . nicotine (NICODERM CQ - dosed in mg/24 hours) patch 21 mg  21 mg Transdermal Daily Linton Flemings, MD   21 mg at 10/25/14 1038  . ondansetron (ZOFRAN) tablet 4 mg  4 mg Oral Q8H PRN Linton Flemings, MD      . sertraline (ZOLOFT) tablet  150 mg  150 mg Oral Daily Patrecia Pour, NP   150 mg at 10/26/14 0954  . zolpidem (AMBIEN) tablet 5 mg  5 mg Oral QHS PRN Linton Flemings, MD   5 mg at 10/25/14 2123   Current Outpatient Prescriptions  Medication Sig Dispense Refill  . carbamazepine (TEGRETOL XR) 200 MG 12 hr tablet Take 1 tablet (200 mg total) by mouth 2 (two) times daily. 60 tablet 0  . hydrOXYzine (ATARAX/VISTARIL) 50 MG tablet Take 1 tablet (50 mg total) by mouth at bedtime. 30 tablet 0  . loperamide (IMODIUM A-D) 2 MG tablet Take 2 mg by mouth 4 (four) times daily as needed for diarrhea or loose stools.    . nicotine (NICODERM CQ - DOSED IN MG/24 HOURS) 21 mg/24hr patch Place 1 patch (21 mg total) onto the skin daily. 28 patch 0  . OVER THE COUNTER MEDICATION Take 1 tablet by mouth daily as needed (diarrhea).    . sertraline (ZOLOFT) 50 MG tablet Take 3 tablets (150 mg total) by mouth daily. 90 tablet 0  . hydrOXYzine (ATARAX/VISTARIL) 25 MG tablet Take 1 tablet (25 mg total) by mouth every 6 (six) hours as needed for anxiety. (Patient not taking: Reported on 10/24/2014) 30 tablet 0    Musculoskeletal: Strength & Muscle Tone: within normal limits Gait & Station: normal Patient leans: N/A  Psychiatric Specialty Exam:     Blood pressure 108/54, pulse 77, temperature 98.1 F (36.7 C), temperature source Oral, resp. rate 18,  SpO2 98 %.There is no weight on file to calculate BMI.  General Appearance: Casual  Eye Contact::  Casual  Speech:  Normal Rate  Volume:  Normal  Mood:  Depressed, mild  Affect:  Congruent  Thought Process:  Coherent  Orientation:  Full (Time, Place, and Person)  Thought Content:  WDL  Suicidal Thoughts:  No  Homicidal Thoughts:  No  Memory:  Good  Judgement:  Good  Insight:  Good  Psychomotor Activity:  Normal  Concentration:  Good  Recall:  Good  Fund of Knowledge:Good  Language: Good  Akathisia:  No  Handed:  Right  AIMS (if indicated):     Assets:  Leisure Time Physical  Health Resilience  ADL's:  Intact  Cognition: WNL  Sleep:      Medical Decision Making: Review of Psycho-Social Stressors (1), Review or order clinical lab tests (1) and Review of Medication Regimen & Side Effects (2)  Treatment Plan Summary:  Disposition: discharge home with follow-up at [facility], her regular providers.  Waylan Boga, [place] 10/26/2014 10:16 AM Patient seen face-to-face for psychiatric evaluation, chart reviewed and case discussed with the physician extender and developed treatment plan. Reviewed the information documented and agree with the treatment plan. Corena Pilgrim, MD

## 2014-10-26 NOTE — BHH Suicide Risk Assessment (Signed)
Suicide Risk Assessment  Discharge Assessment   [facility] Discharge Suicide Risk Assessment   Demographic Factors:  Caucasian and Living alone  Total Time spent with patient: 30 minutes  Musculoskeletal: Strength & Muscle Tone: within normal limits Gait & Station: normal Patient leans: N/A  Psychiatric Specialty Exam:     Blood pressure 108/54, pulse 77, temperature 98.1 F (36.7 C), temperature source Oral, resp. rate 18, SpO2 98 %.There is no weight on file to calculate BMI.  General Appearance: Casual  Eye Contact::  Casual  Speech:  Normal Rate  Volume:  Normal  Mood:  Depressed, mild  Affect:  Congruent  Thought Process:  Coherent  Orientation:  Full (Time, Place, and Person)  Thought Content:  WDL  Suicidal Thoughts:  No  Homicidal Thoughts:  No  Memory:  Good  Judgement:  Good  Insight:  Good  Psychomotor Activity:  Normal  Concentration:  Good  Recall:  Good  Fund of Knowledge:Good  Language: Good  Akathisia:  No  Handed:  Right  AIMS (if indicated):     Assets:  Leisure Time Physical Health Resilience  ADL's:  Intact  Cognition: WNL  Sleep:         Has this patient used any form of tobacco in the last 30 days? (Cigarettes, Smokeless Tobacco, Cigars, and/or Pipes) No  Mental Status Per Nursing Assessment::   On Admission:   Suicidal ideations  Current Mental Status by Physician: NA  Loss Factors: NA  Historical Factors: NA  Risk Reduction Factors:   Sense of responsibility to family, Employed, Positive social support, Positive therapeutic relationship and Positive coping skills or problem solving skills  Continued Clinical Symptoms:  Depression, mild  Cognitive Features That Contribute To Risk:  None    Suicide Risk:  Minimal: No identifiable suicidal ideation.  Patients presenting with no risk factors but with morbid ruminations; may be classified as minimal risk based on the severity of the depressive symptoms  Principal Problem:  Schizoaffective disorder, depressive type Discharge Diagnoses:  Patient Active Problem List   Diagnosis Date Noted  . Suicidal ideation [R45.851] 10/25/2014    Priority: High  . Schizoaffective disorder, depressive type [F25.1] 10/24/2014    Priority: High      Plan Of Care/Follow-up recommendations:  Activity:  as tolerated Diet:  heart healthy diet  Is patient on multiple antipsychotic therapies at discharge:  No   Has Patient had three or more failed trials of antipsychotic monotherapy by history:  No  Recommended Plan for Multiple Antipsychotic Therapies: NA    LORD, JAMISON, PMH-NP 10/26/2014, 10:26 AM
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
## 2014-10-26 NOTE — ED Notes (Signed)
Patient resting quietly in bed with eyes closed, Respirations equal and unlabored, skin warm and dry, NAD. No change in assessment or acuity. Q 15 minute safety checks remain in place.   

## 2014-10-26 NOTE — BH Assessment (Signed)
Latoya Green reports they are at capacity.   Lear Ng, [facility] Triage Specialist 10/26/2014 5:54 AM

## 2014-10-26 NOTE — BH Assessment (Signed)
BHH Assessment Progress Note   Per Mojeed Akintayo, MD this pt does not require psychiatric hospitalization at this time.  She is to be discharged from [facility] with referral information for [facility].  This has been included in her discharge instructions.  Pt's nurse, Olivette, has been notified.  Niels Cranshaw, MA Triage Specialist [phone]    

## 2014-10-26 NOTE — Discharge Instructions (Signed)
For your ongoing mental health needs, you are advised to follow up with [facility].  New and returning patients are seen at their walk-in clinic.  Walk-in hours are Monday - Friday from 8:00 am - 3:00 pm.  Walk-in patients are seen on a first come, first served basis.  Try to arrive as early as possible for he best chance of being seen the same day: ° °     [facility] °     [address] °     [place], [place] [address] °     [phone] °

## 2014-10-28 ENCOUNTER — Ambulatory Visit: Payer: Self-pay | Admitting: Family Medicine

## 2014-12-09 ENCOUNTER — Ambulatory Visit: Payer: Self-pay | Admitting: Family Medicine

## 2015-01-12 ENCOUNTER — Encounter: Payer: Self-pay | Admitting: Family Medicine

## 2015-01-12 ENCOUNTER — Ambulatory Visit (INDEPENDENT_AMBULATORY_CARE_PROVIDER_SITE_OTHER): Payer: No Typology Code available for payment source | Admitting: Family Medicine

## 2015-01-12 VITALS — BP 137/82 | HR 114 | Temp 97.9°F | Resp 18 | Ht 69.5 in | Wt 178.0 lb

## 2015-01-12 DIAGNOSIS — Z789 Other specified health status: Secondary | ICD-10-CM

## 2015-01-12 LAB — CBC WITH DIFFERENTIAL/PLATELET
BASOS ABS: 0 10*3/uL (ref 0.0–0.1)
Basophils Relative: 0 % (ref 0–1)
EOS ABS: 0.1 10*3/uL (ref 0.0–0.7)
Eosinophils Relative: 1 % (ref 0–5)
HEMATOCRIT: 42.4 % (ref 36.0–46.0)
Hemoglobin: 13.4 g/dL (ref 12.0–15.0)
Lymphocytes Relative: 23 % (ref 12–46)
Lymphs Abs: 1.7 10*3/uL (ref 0.7–4.0)
MCH: 24.8 pg — ABNORMAL LOW (ref 26.0–34.0)
MCHC: 31.6 g/dL (ref 30.0–36.0)
MCV: 78.4 fL (ref 78.0–100.0)
MPV: 10.8 fL (ref 8.6–12.4)
Monocytes Absolute: 0.6 10*3/uL (ref 0.1–1.0)
Monocytes Relative: 8 % (ref 3–12)
Neutro Abs: 5.2 10*3/uL (ref 1.7–7.7)
Neutrophils Relative %: 68 % (ref 43–77)
Platelets: 300 10*3/uL (ref 150–400)
RBC: 5.41 MIL/uL — AB (ref 3.87–5.11)
RDW: 16.6 % — AB (ref 11.5–15.5)
WBC: 7.6 10*3/uL (ref 4.0–10.5)

## 2015-01-12 LAB — COMPLETE METABOLIC PANEL WITH GFR
ALT: 11 U/L (ref 6–29)
AST: 17 U/L (ref 10–35)
Albumin: 4.2 g/dL (ref 3.6–5.1)
Alkaline Phosphatase: 91 U/L (ref 33–130)
BUN: 5 mg/dL — ABNORMAL LOW (ref 7–25)
CHLORIDE: 105 meq/L (ref 98–110)
CO2: 23 meq/L (ref 20–31)
Calcium: 9.4 mg/dL (ref 8.6–10.4)
Creat: 0.67 mg/dL (ref 0.50–1.05)
GFR, Est African American: >89 mL/min (ref >=60)
GFR, Est Non African American: >89 mL/min (ref >=60)
Glucose, Bld: 120 mg/dL — ABNORMAL HIGH (ref 65–99)
Potassium: 4.4 mEq/L (ref 3.5–5.3)
Sodium: 140 mEq/L (ref 135–146)
Total Bilirubin: 0.3 mg/dL (ref 0.2–1.2)
Total Protein: 7 g/dL (ref 6.1–8.1)

## 2015-01-12 LAB — TSH: TSH: 2.562 u[IU]/mL (ref 0.350–4.500)

## 2015-01-12 LAB — CHOLESTEROL, TOTAL: Cholesterol: 152 mg/dL (ref 125–200)
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
## 2015-01-12 NOTE — Patient Instructions (Signed)
We will need to get records from [facility] before we can address health maintenance needs like immunizations, PAP, mammogram. We will call if there is anything in your bloodwork that needs attention. In meantime focus on eating a healthy diet and exercising several days a week. Follow-up in three months and as needed before then We are giving you stool cards with instructions.

## 2015-01-13 LAB — HEPATITIS C ANTIBODY: HCV Ab: NEGATIVE

## 2015-04-14 ENCOUNTER — Encounter: Payer: Self-pay | Admitting: Family Medicine

## 2015-04-14 ENCOUNTER — Ambulatory Visit (INDEPENDENT_AMBULATORY_CARE_PROVIDER_SITE_OTHER): Payer: No Typology Code available for payment source | Admitting: Family Medicine

## 2015-04-14 VITALS — BP 136/88 | HR 105 | Temp 97.5°F | Resp 16 | Ht 69.5 in | Wt 189.0 lb

## 2015-04-14 DIAGNOSIS — J069 Acute upper respiratory infection, unspecified: Secondary | ICD-10-CM

## 2015-04-14 DIAGNOSIS — R059 Cough, unspecified: Secondary | ICD-10-CM

## 2015-04-14 DIAGNOSIS — J3489 Other specified disorders of nose and nasal sinuses: Secondary | ICD-10-CM

## 2015-04-14 DIAGNOSIS — R05 Cough: Secondary | ICD-10-CM

## 2015-04-14 DIAGNOSIS — F17200 Nicotine dependence, unspecified, uncomplicated: Secondary | ICD-10-CM | POA: Insufficient documentation

## 2015-04-14 MED ORDER — CETIRIZINE HCL 10 MG PO TABS: 10.0000 mg | ORAL_TABLET | Freq: Every day | ORAL | Status: DC

## 2015-04-14 MED ORDER — AZITHROMYCIN 250 MG PO TABS: ORAL_TABLET | ORAL | Status: DC

## 2015-04-14 MED ORDER — GUAIFENESIN 100 MG/5ML PO SOLN: 5.0000 mL | ORAL | Status: DC | PRN

## 2015-04-14 NOTE — Progress Notes (Signed)
Subjective:    Patient ID: Latoya Green, female    DOB: 1955/08/12, 59 y.o.   MRN: [id number]  HPI Ms. Latoya Green, a 59 year old female with a history of depression and anxiety presents complaining of a congested cough, runny nose, headache, and fatigue over the past week.  Patient states that she is using Tussin DM. She is coughing up clear mucous consistently. Symptoms include congestion, cough and sore throat. She denies fever,  coryza, purulent nasal discharge, sneezing, tooth pain, vertigo and wheezing.  She is drinking moderate amounts of fluids. Treatment to date includes Tylenol last taken around 9 pm last night. Patient is an everyday tobacco smoker, she  has been smoking for 25 years.  She maintains that she has cut down on smoking due for the past several weeks due to cold and cough.   Past Medical History  Diagnosis Date  . Depression   . Cancer     skin cancer 1997  . Epilepsy     1994  . Depression   . Anxiety    There is no immunization history on file for this patient.  Social History   Social History  . Marital Status: Divorced    Spouse Name: N/A  . Number of Children: N/A  . Years of Education: N/A   Occupational History  . Not on file.   Social History Main Topics  . Smoking status: Current Every Day Smoker -- 1.00 packs/day for 25 years    Types: Cigarettes  . Smokeless tobacco: Not on file  . Alcohol Use: No  . Drug Use: No  . Sexual Activity: Not Currently    Birth Control/ Protection: Abstinence   Other Topics Concern  . Not on file   Social History Narrative   Review of Systems  Constitutional: Positive for fatigue.  HENT: Positive for postnasal drip and rhinorrhea. Negative for congestion, ear pain, sinus pressure, sneezing, sore throat and voice change.   Eyes: Negative.   Respiratory: Negative.   Cardiovascular: Negative.  Negative for chest pain, palpitations and leg swelling.  Gastrointestinal: Negative.   Endocrine: Negative.    Genitourinary: Negative.   Musculoskeletal: Negative.   Skin: Negative.   Allergic/Immunologic: Negative.   Neurological: Positive for headaches.  Hematological: Negative.   Psychiatric/Behavioral: Negative.  Negative for suicidal ideas and sleep disturbance.       Objective:   Physical Exam  Constitutional: She is oriented to person, place, and time. She appears well-developed and well-nourished. She has a sickly appearance.  HENT:  Head: Normocephalic and atraumatic.  Right Ear: External ear normal.  Left Ear: External ear normal.  Nose: Mucosal edema and rhinorrhea present. No sinus tenderness. Right sinus exhibits no maxillary sinus tenderness and no frontal sinus tenderness. Left sinus exhibits no maxillary sinus tenderness and no frontal sinus tenderness.  Mouth/Throat: Oropharynx is clear and moist.  Eyes: Conjunctivae and EOM are normal. Pupils are equal, round, and reactive to light.  Neck: Normal range of motion. Neck supple.  Cardiovascular: Normal rate, regular rhythm, normal heart sounds and intact distal pulses.   Pulmonary/Chest: Breath sounds normal. No accessory muscle usage. No tachypnea and no bradypnea. No respiratory distress.  Congested cough  Abdominal: Soft. Bowel sounds are normal.  Musculoskeletal: Normal range of motion.  Lymphadenopathy:       Head (right side): No submental and no submandibular adenopathy present.       Head (left side): No submental and no submandibular adenopathy present.  Neurological: She is  alert and oriented to person, place, and time. She has normal reflexes.  Skin: Skin is warm and dry.  Psychiatric: She has a normal mood and affect. Her behavior is normal. Judgment and thought content normal.      BP 136/88 mmHg  Pulse 105  Temp(Src) 97.5 F (36.4 C) (Oral)  Resp 16  Ht 5' 9.5" (1.765 m)  Wt 189 lb (85.73 kg)  BMI 27.52 kg/m2 Assessment & Plan:  1. Acute upper respiratory infection - azithromycin (ZITHROMAX) 250 MG  tablet; Take 500 mg Day 1; Days 2-5 Take 250 mg 1 tablet  Dispense: 6 tablet; Refill: 0 - cetirizine (ZYRTEC) 10 MG tablet; Take 1 tablet (10 mg total) by mouth daily.  Dispense: 30 tablet; Refill: 0 - guaiFENesin (ROBITUSSIN) 100 MG/5ML SOLN; Take 5 mLs (100 mg total) by mouth every 4 (four) hours as needed for cough or to loosen phlegm.  Dispense: 236 mL; Refill: 0  2. Stuffy and runny nose  - cetirizine (ZYRTEC) 10 MG tablet; Take 1 tablet (10 mg total) by mouth daily.  Dispense: 30 tablet; Refill: 0  3. Cough If cough continues beyond 3 weeks, patient may warrant chest x-ray. Patient was reminded to follow up by phone in 1 week if symptoms persist.  - guaiFENesin (ROBITUSSIN) 100 MG/5ML SOLN; Take 5 mLs (100 mg total) by mouth every 4 (four) hours as needed for cough or to loosen phlegm.  Dispense: 236 mL; Refill: 0  4. Tobacco dependence Smoking cessation instruction/counseling given:  counseled patient on the dangers of tobacco use, advised patient to stop smoking, and reviewed strategies to maximize success    .Dorena Dew, FNP

## 2015-04-14 NOTE — Patient Instructions (Signed)

## 2015-10-13 ENCOUNTER — Encounter: Payer: Self-pay | Admitting: Family Medicine

## 2016-02-01 ENCOUNTER — Telehealth: Payer: Self-pay

## 2016-02-01 ENCOUNTER — Other Ambulatory Visit: Payer: Self-pay | Admitting: Family Medicine

## 2016-02-01 NOTE — Telephone Encounter (Signed)
Latoya Green Can you please advise. Patient states she has been taking Tegretol for 31 years for Seizures and the pharmacy can't get in touch with Mental health to refill it. She is asking if this is something you could please refill for her. Please advise. Thanks!

## 2016-02-01 NOTE — Telephone Encounter (Signed)
Tried to call, no answer. Will try later. Thanks!

## 2016-12-08 ENCOUNTER — Emergency Department (HOSPITAL_COMMUNITY)
Admission: EM | Admit: 2016-12-08 | Discharge: 2016-12-08 | Disposition: A | Discharge status: Home / Self Care | Payer: No Typology Code available for payment source | Attending: Emergency Medicine | Admitting: Emergency Medicine

## 2016-12-08 DIAGNOSIS — T18128A Food in esophagus causing other injury, initial encounter: Secondary | ICD-10-CM

## 2016-12-08 DIAGNOSIS — R1110 Vomiting, unspecified: Secondary | ICD-10-CM | POA: Insufficient documentation

## 2016-12-08 DIAGNOSIS — Z79899 Other long term (current) drug therapy: Secondary | ICD-10-CM | POA: Insufficient documentation

## 2016-12-08 DIAGNOSIS — K222 Esophageal obstruction: Secondary | ICD-10-CM | POA: Insufficient documentation

## 2016-12-08 DIAGNOSIS — F17210 Nicotine dependence, cigarettes, uncomplicated: Secondary | ICD-10-CM | POA: Insufficient documentation

## 2016-12-08 DIAGNOSIS — R079 Chest pain, unspecified: Secondary | ICD-10-CM | POA: Insufficient documentation

## 2016-12-08 MED ORDER — GLUCAGON HCL RDNA (DIAGNOSTIC) 1 MG IJ SOLR
1.0000 mg | Freq: Once | INTRAMUSCULAR | Status: AC
  Administered 2016-12-08: 1 mg via INTRAVENOUS
  Filled 2016-12-08: qty 1

## 2016-12-08 NOTE — ED Triage Notes (Signed)
Per EMS: Pt states choked on a hamburger. Pt states hx of same. Pt complaining of N/V x 1 day. Pt denies any diarrhea or urinary complaints. Pt denies any chest pain or SOB.

## 2016-12-08 NOTE — Discharge Instructions (Signed)
Follow up with [facility] for further evaluation of swallowing difficulty.

## 2016-12-08 NOTE — ED Provider Notes (Signed)
[facility] DEPT Provider Note   CSN: [id number] Arrival date & time: 12/08/16  0000     History   Chief Complaint Chief Complaint  Patient presents with  . Other    Food stuck in throat    HPI Latoya Green is a 61 y.o. female.  Patient presents with complaint of being unable to swallow anything since choking on a hamburger around 3:00 pm yesterday (12/07/16). She feels it got stuck in her esophagus and has been unable to drink even small amounts of water since. She states this has happened in the past but denies any GI procedures of EGD or esophageal dilation. No SOB or cough.   The history is provided by the patient. No language interpreter was used.    Past Medical History:  Diagnosis Date  . Anxiety   . Cancer ([place])    skin cancer 1997  . Depression   . Depression   . Epilepsy [facility])    1994    Patient Active Problem List   Diagnosis Date Noted  . Tobacco dependence 04/14/2015  . Suicidal ideation 10/25/2014  . Schizoaffective disorder, depressive type ([place]) 10/24/2014    Past Surgical History:  Procedure Laterality Date  . skin ca removed      OB History    No data available       Home Medications    Prior to Admission medications   Medication Sig Start Date End Date Taking? Authorizing Provider  azithromycin (ZITHROMAX) 250 MG tablet Take 500 mg Day 1; Days 2-5 Take 250 mg 1 tablet 04/14/15   Dorena Dew, FNP  carbamazepine (TEGRETOL XR) 200 MG 12 hr tablet Take 1 tablet (200 mg total) by mouth 2 (two) times daily. 10/24/14   Niel Hummer, NP  cetirizine (ZYRTEC) 10 MG tablet Take 1 tablet (10 mg total) by mouth daily. 04/14/15   Dorena Dew, FNP  guaiFENesin (ROBITUSSIN) 100 MG/5ML SOLN Take 5 mLs (100 mg total) by mouth every 4 (four) hours as needed for cough or to loosen phlegm. 04/14/15   Dorena Dew, FNP  hydrOXYzine (ATARAX/VISTARIL) 25 MG tablet Take 1 tablet (25 mg total) by mouth every 6 (six) hours as needed for  anxiety. Patient not taking: Reported on 01/12/2015 04/11/14   Kerrie Buffalo, NP  hydrOXYzine (ATARAX/VISTARIL) 50 MG tablet Take 1 tablet (50 mg total) by mouth at bedtime. 10/24/14   Niel Hummer, NP  loperamide (IMODIUM A-D) 2 MG tablet Take 2 mg by mouth 4 (four) times daily as needed for diarrhea or loose stools.    [provider]  lurasidone (LATUDA) 40 MG TABS tablet Take 40 mg by mouth daily with breakfast.    [provider]  nicotine (NICODERM CQ - DOSED IN MG/24 HOURS) 21 mg/24hr patch Place 1 patch (21 mg total) onto the skin daily. Patient not taking: Reported on 01/12/2015 10/24/14   Niel Hummer, NP  OVER THE COUNTER MEDICATION Take 1 tablet by mouth daily as needed (diarrhea).    [provider]  sertraline (ZOLOFT) 50 MG tablet Take 3 tablets (150 mg total) by mouth daily. 10/24/14   Niel Hummer, NP    Family History No family history on file.  Social History Social History  Substance Use Topics  . Smoking status: Current Every Day Smoker    Packs/day: 1.00    Years: 25.00    Types: Cigarettes  . Smokeless tobacco: Not on file  . Alcohol use No  Allergies   Keflex [cephalexin] and Penicillins   Review of Systems Review of Systems  Constitutional: Negative for chills and fever.  HENT: Positive for trouble swallowing.   Respiratory: Negative.  Negative for cough and shortness of breath.   Cardiovascular: Positive for chest pain.  Gastrointestinal: Positive for vomiting.  Neurological: Negative.  Negative for syncope and light-headedness.     Physical Exam Updated Vital Signs BP 118/80   Pulse (!) 108   Temp 98.1 F (36.7 C) (Oral)   Resp 15   SpO2 98%   Physical Exam  Constitutional: She is oriented to person, place, and time. She appears well-developed and well-nourished.  HENT:  Head: Normocephalic.  Mouth/Throat: Oropharynx is clear and moist.  Neck: Normal range of motion. Neck supple.  Cardiovascular: Normal  rate and regular rhythm.   Pulmonary/Chest: Effort normal and breath sounds normal. No stridor. She has no wheezes. She has no rales.  Abdominal: Soft. Bowel sounds are normal. There is no tenderness. There is no rebound and no guarding.  Musculoskeletal: Normal range of motion.  Neurological: She is alert and oriented to person, place, and time.  Skin: Skin is warm and dry. No rash noted.  Psychiatric: She has a normal mood and affect.     ED Treatments / Results  Labs (all labs ordered are listed, but only abnormal results are displayed) Labs Reviewed - No data to display  EKG  EKG Interpretation None       Radiology No results found.  Procedures Procedures (including critical care time)  Medications Ordered in ED Medications  glucagon (human recombinant) (GLUCAGEN) injection 1 mg (not administered)     Initial Impression / Assessment and Plan / ED Course  I have reviewed the triage vital signs and the nursing notes.  Pertinent labs & imaging results that were available during my care of the patient were reviewed by me and considered in my medical decision making (see chart for details).     Patient reports dysphagia since eating a hamburger earlier. She states she has had similar symptoms before that were resolved with a medication. No GI procedures related to stricture in the past.   Glucagon was provided with complete relief of discomfort. She feels she can swallow with difficulty now. She did not, at any point, have difficulty managing her own secretions.   She is given water and is able to tolerate. Feel symptoms were esophageal food impaction vs esophageal spasm. Feel spasm more likely  Final Clinical Impressions(s) / ED Diagnoses   Final diagnoses:  None   1. dysphagia New Prescriptions New Prescriptions   No medications on file     Charlann Lange, Hershal Coria 12/13/16 3159    Sherwood Gambler, MD 12/20/16 2340

## 2017-07-21 DIAGNOSIS — F331 Major depressive disorder, recurrent, moderate: Secondary | ICD-10-CM | POA: Diagnosis not present

## 2017-10-07 DIAGNOSIS — F331 Major depressive disorder, recurrent, moderate: Secondary | ICD-10-CM | POA: Diagnosis not present

## 2017-10-07 DIAGNOSIS — F603 Borderline personality disorder: Secondary | ICD-10-CM | POA: Diagnosis not present

## 2017-10-07 DIAGNOSIS — F411 Generalized anxiety disorder: Secondary | ICD-10-CM | POA: Diagnosis not present

## 2017-11-25 ENCOUNTER — Encounter (HOSPITAL_COMMUNITY): Payer: Self-pay | Admitting: Emergency Medicine

## 2017-11-25 ENCOUNTER — Emergency Department (HOSPITAL_COMMUNITY)
Admission: EM | Admit: 2017-11-25 | Discharge: 2017-11-26 | Disposition: A | Discharge status: Home / Self Care | Payer: No Typology Code available for payment source | Attending: Emergency Medicine | Admitting: Emergency Medicine

## 2017-11-25 ENCOUNTER — Other Ambulatory Visit: Payer: Self-pay

## 2017-11-25 DIAGNOSIS — Z5321 Procedure and treatment not carried out due to patient leaving prior to being seen by health care provider: Secondary | ICD-10-CM | POA: Insufficient documentation

## 2017-11-25 DIAGNOSIS — H5711 Ocular pain, right eye: Secondary | ICD-10-CM | POA: Insufficient documentation

## 2017-11-25 DIAGNOSIS — H5712 Ocular pain, left eye: Secondary | ICD-10-CM | POA: Insufficient documentation

## 2017-11-25 NOTE — ED Triage Notes (Signed)
Pt reports there is a substance in her eyes that is making her eyes water.

## 2017-11-26 NOTE — ED Notes (Signed)
Pt. Stated they were leaving and going to go to a clinic in the AM.

## 2017-12-08 ENCOUNTER — Encounter (HOSPITAL_COMMUNITY): Payer: Self-pay | Admitting: Emergency Medicine

## 2017-12-08 ENCOUNTER — Emergency Department (HOSPITAL_COMMUNITY)
Admission: EM | Admit: 2017-12-08 | Discharge: 2017-12-08 | Disposition: A | Discharge status: Home / Self Care | Payer: Medicare Other | Attending: Physician Assistant | Admitting: Physician Assistant

## 2017-12-08 DIAGNOSIS — H1013 Acute atopic conjunctivitis, bilateral: Secondary | ICD-10-CM | POA: Diagnosis not present

## 2017-12-08 DIAGNOSIS — F17210 Nicotine dependence, cigarettes, uncomplicated: Secondary | ICD-10-CM | POA: Diagnosis not present

## 2017-12-08 DIAGNOSIS — H1045 Other chronic allergic conjunctivitis: Secondary | ICD-10-CM | POA: Insufficient documentation

## 2017-12-08 DIAGNOSIS — K5900 Constipation, unspecified: Secondary | ICD-10-CM | POA: Diagnosis not present

## 2017-12-08 DIAGNOSIS — Z79899 Other long term (current) drug therapy: Secondary | ICD-10-CM | POA: Insufficient documentation

## 2017-12-08 MED ORDER — POLYETHYLENE GLYCOL 3350 17 GM/SCOOP PO POWD: ORAL | 0 refills | Status: DC

## 2017-12-08 MED ORDER — KETOTIFEN FUMARATE 0.025 % OP SOLN: 1.0000 [drp] | Freq: Two times a day (BID) | OPHTHALMIC | 0 refills | Status: DC

## 2017-12-08 MED ORDER — ARTIFICIAL TEARS OPHTHALMIC OINT: TOPICAL_OINTMENT | OPHTHALMIC | 0 refills | Status: DC | PRN

## 2017-12-08 NOTE — ED Provider Notes (Signed)
[facility] EMERGENCY DEPARTMENT Provider Note   CSN: [id number] Arrival date & time: 12/08/17  0945     History   Chief Complaint Chief Complaint  Patient presents with  . Allergic Reaction    HPI Latoya Green is a 62 y.o. female who presents the emergency department for evaluation of itchy eyes and constipation.  The patient states that she thinks that her home is infested and causing her vision to have problems she notes that she frequently has itchy eyes and wakes up with "gunk" in her eyes.  She also states that she has been constipated and thinks that this is because she is being poisoned by her home.  She states that one year ago she saw "the blonde on the third" meaning 1 of the social workers who works in the evenings and states that that Education officer, museum told her that when she turns 62 she could come back with her cards and get placed in a nursing home.  Should not is able to care for herself, walk, bathe, dress and feed herself.  She states that she is getting her primary care at "[facility]."  She denies fevers or chills.  She has been using Visine for her eyes which improves her symptoms  HPI  Past Medical History:  Diagnosis Date  . Anxiety   . Cancer ([place])    skin cancer 1997  . Depression   . Depression   . Epilepsy [facility])    1994    Patient Active Problem List   Diagnosis Date Noted  . Tobacco dependence 04/14/2015  . Suicidal ideation 10/25/2014  . Schizoaffective disorder, depressive type ([place]) 10/24/2014    Past Surgical History:  Procedure Laterality Date  . skin ca removed       OB History   None      Home Medications    Prior to Admission medications   Medication Sig Start Date End Date Taking? Authorizing Provider  azithromycin (ZITHROMAX) 250 MG tablet Take 500 mg Day 1; Days 2-5 Take 250 mg 1 tablet 04/14/15   Dorena Dew, FNP  carbamazepine (TEGRETOL XR) 200 MG 12 hr tablet Take 1 tablet (200 mg total) by mouth 2  (two) times daily. 10/24/14   Niel Hummer, NP  cetirizine (ZYRTEC) 10 MG tablet Take 1 tablet (10 mg total) by mouth daily. 04/14/15   Dorena Dew, FNP  guaiFENesin (ROBITUSSIN) 100 MG/5ML SOLN Take 5 mLs (100 mg total) by mouth every 4 (four) hours as needed for cough or to loosen phlegm. 04/14/15   Dorena Dew, FNP  hydrOXYzine (ATARAX/VISTARIL) 25 MG tablet Take 1 tablet (25 mg total) by mouth every 6 (six) hours as needed for anxiety. Patient not taking: Reported on 01/12/2015 04/11/14   Kerrie Buffalo, NP  hydrOXYzine (ATARAX/VISTARIL) 50 MG tablet Take 1 tablet (50 mg total) by mouth at bedtime. 10/24/14   Niel Hummer, NP  loperamide (IMODIUM A-D) 2 MG tablet Take 2 mg by mouth 4 (four) times daily as needed for diarrhea or loose stools.    [provider]  lurasidone (LATUDA) 40 MG TABS tablet Take 40 mg by mouth daily with breakfast.    [provider]  nicotine (NICODERM CQ - DOSED IN MG/24 HOURS) 21 mg/24hr patch Place 1 patch (21 mg total) onto the skin daily. Patient not taking: Reported on 01/12/2015 10/24/14   Niel Hummer, NP  OVER THE COUNTER MEDICATION Take 1 tablet by mouth daily as needed (  diarrhea).    [provider]  sertraline (ZOLOFT) 50 MG tablet Take 3 tablets (150 mg total) by mouth daily. 10/24/14   Niel Hummer, NP    Family History No family history on file.  Social History Social History   Tobacco Use  . Smoking status: Current Every Day Smoker    Packs/day: 1.00    Years: 25.00    Pack years: 25.00    Types: Cigarettes  . Smokeless tobacco: Never Used  Substance Use Topics  . Alcohol use: No  . Drug use: No     Allergies   Keflex [cephalexin] and Penicillins   Review of Systems Review of Systems Ten systems reviewed and are negative for acute change, except as noted in the HPI.    Physical Exam Updated Vital Signs BP 124/68 (BP Location: Right Arm)   Pulse (!) 118   Temp 99.3 F (37.4 C) (Oral)    Resp 20   SpO2 96%   Physical Exam  Constitutional: She is oriented to person, place, and time. She appears well-developed and well-nourished. No distress.  HENT:  Head: Normocephalic and atraumatic.  Eyes: Pupils are equal, round, and reactive to light. Conjunctivae and EOM are normal. No scleral icterus.  Today are normal bilaterally patient wearing her glasses without gross visual abnormalities.  There is no discharge or mattering.  Patient denies eye pain or vision loss.   Neck: Normal range of motion.  Cardiovascular: Normal rate, regular rhythm and normal heart sounds. Exam reveals no gallop and no friction rub.  No murmur heard. Pulmonary/Chest: Effort normal and breath sounds normal. No respiratory distress.  Abdominal: Soft. Bowel sounds are normal. She exhibits no distension and no mass. There is no tenderness. There is no guarding.  Neurological: She is alert and oriented to person, place, and time.  Skin: Skin is warm and dry. She is not diaphoretic.  Psychiatric: Her behavior is normal.  Nursing note and vitals reviewed.    ED Treatments / Results  Labs (all labs ordered are listed, but only abnormal results are displayed) Labs Reviewed - No data to display  EKG None  Radiology No results found.  Procedures Procedures (including critical care time)  Medications Ordered in ED Medications - No data to display   Initial Impression / Assessment and Plan / ED Course  I have reviewed the triage vital signs and the nursing notes.  Pertinent labs & imaging results that were available during my care of the patient were reviewed by me and considered in my medical decision making (see chart for details).     Requesting nursing home placement.  She thinks that because she has Medicare and Medicaid she can be placed in a nursing home to be cared for.  She does not have any medical issues at this time that require SNF placement and I had our social worker come by to  discuss options we did inform her that if she wanted assisted living facility would be sufficient to pay for out-of-pocket and generally they started about $15,000 a month.  We did discuss other housing opportunities for the patient such as boarding homes, or roommates.  I also talked to the patient about following up with [place] for help if she feels that her house is unlivable.  We have advised the patient that she should also seek with social services about having her home inspected for issues include case this is ultimately a psychological fixation and delusion for the  patient.  Have high suspicion for underlying psychiatric issues given her previous diagnosis of schizoaffective disorder.  Patient is intermittently tearful and then joyful during the examination.  I also think she will has allergic conjunctivitis and will start her on keto to-drops.  Patient advised to follow-up with ophthalmology appears appropriate for discharge at this time.  Will address her constipation with MiraLAX.  Final Clinical Impressions(s) / ED Diagnoses   Final diagnoses:  None    ED Discharge Orders    None       Margarita Mail, PA-C 12/08/17 1705    [place], Fredia Sorrow, MD 12/09/17 [phone]

## 2017-12-08 NOTE — ED Notes (Signed)
ED Provider at bedside. 

## 2017-12-08 NOTE — Discharge Instructions (Signed)
Get help right away if: You have redness, swelling, or other symptoms in only one eye. Your vision is blurred or you have vision changes. You have severe eye pain.

## 2017-12-08 NOTE — ED Triage Notes (Signed)
Pt states she is living in an "unlivable house"- something in the air. Pt reports that she wipes "grit and crap" out of her eyes daily. Feels like her vision is not as good as it was. Pt reports also that her bowels are acting up, she used to  Have normal bowel movements, but now says they are just "chips and pieces." Feels that whatever is in her home is affecting her bowels.

## 2018-01-06 DIAGNOSIS — F411 Generalized anxiety disorder: Secondary | ICD-10-CM | POA: Diagnosis not present

## 2018-01-06 DIAGNOSIS — F331 Major depressive disorder, recurrent, moderate: Secondary | ICD-10-CM | POA: Diagnosis not present

## 2018-01-06 DIAGNOSIS — F603 Borderline personality disorder: Secondary | ICD-10-CM | POA: Diagnosis not present

## 2018-01-22 DIAGNOSIS — H5203 Hypermetropia, bilateral: Secondary | ICD-10-CM | POA: Diagnosis not present

## 2018-01-22 DIAGNOSIS — H10413 Chronic giant papillary conjunctivitis, bilateral: Secondary | ICD-10-CM | POA: Diagnosis not present

## 2018-03-10 DIAGNOSIS — Z114 Encounter for screening for human immunodeficiency virus [HIV]: Secondary | ICD-10-CM | POA: Diagnosis not present

## 2018-03-10 DIAGNOSIS — Z0000 Encounter for general adult medical examination without abnormal findings: Secondary | ICD-10-CM | POA: Diagnosis not present

## 2018-03-10 DIAGNOSIS — R5383 Other fatigue: Secondary | ICD-10-CM | POA: Diagnosis not present

## 2018-03-10 DIAGNOSIS — R0602 Shortness of breath: Secondary | ICD-10-CM | POA: Diagnosis not present

## 2018-03-20 ENCOUNTER — Emergency Department (HOSPITAL_COMMUNITY)
Admission: EM | Admit: 2018-03-20 | Discharge: 2018-03-20 | Disposition: A | Discharge status: Home / Self Care | Payer: Medicare Other | Attending: Emergency Medicine | Admitting: Emergency Medicine

## 2018-03-20 ENCOUNTER — Encounter (HOSPITAL_COMMUNITY): Payer: Self-pay | Admitting: Emergency Medicine

## 2018-03-20 DIAGNOSIS — R069 Unspecified abnormalities of breathing: Secondary | ICD-10-CM | POA: Diagnosis not present

## 2018-03-20 DIAGNOSIS — Z79899 Other long term (current) drug therapy: Secondary | ICD-10-CM | POA: Diagnosis not present

## 2018-03-20 DIAGNOSIS — R000 Tachycardia, unspecified: Secondary | ICD-10-CM | POA: Diagnosis not present

## 2018-03-20 DIAGNOSIS — Y999 Unspecified external cause status: Secondary | ICD-10-CM | POA: Insufficient documentation

## 2018-03-20 DIAGNOSIS — Y929 Unspecified place or not applicable: Secondary | ICD-10-CM | POA: Diagnosis not present

## 2018-03-20 DIAGNOSIS — R070 Pain in throat: Secondary | ICD-10-CM | POA: Diagnosis not present

## 2018-03-20 DIAGNOSIS — X58XXXA Exposure to other specified factors, initial encounter: Secondary | ICD-10-CM | POA: Insufficient documentation

## 2018-03-20 DIAGNOSIS — Y939 Activity, unspecified: Secondary | ICD-10-CM | POA: Insufficient documentation

## 2018-03-20 DIAGNOSIS — T18128A Food in esophagus causing other injury, initial encounter: Secondary | ICD-10-CM | POA: Insufficient documentation

## 2018-03-20 DIAGNOSIS — F17210 Nicotine dependence, cigarettes, uncomplicated: Secondary | ICD-10-CM | POA: Insufficient documentation

## 2018-03-20 DIAGNOSIS — R1111 Vomiting without nausea: Secondary | ICD-10-CM | POA: Diagnosis not present

## 2018-03-20 DIAGNOSIS — Z85828 Personal history of other malignant neoplasm of skin: Secondary | ICD-10-CM | POA: Insufficient documentation

## 2018-03-20 MED ORDER — OMEPRAZOLE 20 MG PO CPDR: 20.0000 mg | DELAYED_RELEASE_CAPSULE | Freq: Every day | ORAL | 0 refills | Status: DC

## 2018-03-20 MED ORDER — ONDANSETRON 4 MG PO TBDP
4.0000 mg | ORAL_TABLET | Freq: Once | ORAL | Status: AC
  Administered 2018-03-20: 4 mg via ORAL
  Filled 2018-03-20: qty 1

## 2018-03-20 MED ORDER — GLUCAGON HCL RDNA (DIAGNOSTIC) 1 MG IJ SOLR
1.0000 mg | Freq: Once | INTRAMUSCULAR | Status: DC
  Filled 2018-03-20: qty 1

## 2018-03-20 MED ORDER — GLUCAGON HCL RDNA (DIAGNOSTIC) 1 MG IJ SOLR
1.0000 mg | Freq: Once | INTRAMUSCULAR | Status: AC
  Administered 2018-03-20: 1 mg via INTRAMUSCULAR

## 2018-03-20 NOTE — ED Notes (Signed)
Informed pt that she will need an IV in order for me to give pt medications that will aid in moving the steak down her throat.  Pt became tearful and asked if there was something else we could give her.  Pt stated that the last time, pt was given a pill.  Legrand Como, [place] made aware.

## 2018-03-20 NOTE — Discharge Instructions (Signed)
See the GI doctor as requested. Take the medicine prescribed.

## 2018-03-20 NOTE — ED Triage Notes (Signed)
Patient here from home via EMS reporting that she got a piece of steak stuck in throat last night, reports that she fells like it is still there. Also states that she is not suppose to eat meat due to hx of same.

## 2018-03-20 NOTE — ED Provider Notes (Signed)
[facility] DEPT Provider Note   CSN: [id number] Arrival date & time: 03/20/18  1320     History   Chief Complaint Chief Complaint  Patient presents with  . Foreign Body  . Sore Throat    HPI Latoya Green is a 62 y.o. female who presents emergency department today for sensation of foreign body in her throat.  Patient reports yesterday evening she was eating a T-bone steak and not chew it properly.  She notes after swallowing it she felt something was stuck at the base of her esophagus. She has tried to make herself vomit it up without relief. She denies any chest pain, sob, cough. Pain is not worsened by exertion. She notes that symptoms feel similar to when she has the same in the past. She reports that it is always relieved with medication. Last was seen here for the same on 12/08/16. She has been told in the past not to eat meat because this happens. She denies being followed by GI. No EGD or dilation in the past. No abdominal pain. No voice change, patient is tolerating her secretions without difficulty, no pain with swallowing.   HPI  Past Medical History:  Diagnosis Date  . Anxiety   . Cancer ([place])    skin cancer 1997  . Depression   . Depression   . Epilepsy [facility])    1994    Patient Active Problem List   Diagnosis Date Noted  . Tobacco dependence 04/14/2015  . Suicidal ideation 10/25/2014  . Schizoaffective disorder, depressive type ([place]) 10/24/2014    Past Surgical History:  Procedure Laterality Date  . skin ca removed       OB History   None      Home Medications    Prior to Admission medications   Medication Sig Start Date End Date Taking? Authorizing Provider  artificial tears (LACRILUBE) OINT ophthalmic ointment Place into both eyes every 4 (four) hours as needed for dry eyes. 12/08/17   Margarita Mail, PA-C  azithromycin (ZITHROMAX) 250 MG tablet Take 500 mg Day 1; Days 2-5 Take 250 mg 1 tablet 04/14/15   Dorena Dew, FNP  carbamazepine (TEGRETOL XR) 200 MG 12 hr tablet Take 1 tablet (200 mg total) by mouth 2 (two) times daily. 10/24/14   Niel Hummer, NP  cetirizine (ZYRTEC) 10 MG tablet Take 1 tablet (10 mg total) by mouth daily. 04/14/15   Dorena Dew, FNP  guaiFENesin (ROBITUSSIN) 100 MG/5ML SOLN Take 5 mLs (100 mg total) by mouth every 4 (four) hours as needed for cough or to loosen phlegm. 04/14/15   Dorena Dew, FNP  hydrOXYzine (ATARAX/VISTARIL) 25 MG tablet Take 1 tablet (25 mg total) by mouth every 6 (six) hours as needed for anxiety. Patient not taking: Reported on 01/12/2015 04/11/14   Kerrie Buffalo, NP  hydrOXYzine (ATARAX/VISTARIL) 50 MG tablet Take 1 tablet (50 mg total) by mouth at bedtime. 10/24/14   Niel Hummer, NP  ketotifen (ZADITOR) 0.025 % ophthalmic solution Place 1 drop into both eyes 2 (two) times daily. 12/08/17   Margarita Mail, PA-C  loperamide (IMODIUM A-D) 2 MG tablet Take 2 mg by mouth 4 (four) times daily as needed for diarrhea or loose stools.    [provider]  lurasidone (LATUDA) 40 MG TABS tablet Take 40 mg by mouth daily with breakfast.    [provider]  nicotine (NICODERM CQ - DOSED IN MG/24 HOURS) 21 mg/24hr patch Place 1 patch (  21 mg total) onto the skin daily. Patient not taking: Reported on 01/12/2015 10/24/14   Niel Hummer, NP  OVER THE COUNTER MEDICATION Take 1 tablet by mouth daily as needed (diarrhea).    [provider]  polyethylene glycol powder (GLYCOLAX/MIRALAX) powder 1 capful of miralax in 10 ounces of water up to 3 x a day until bowel movements are full and soft. 12/08/17   Margarita Mail, PA-C  sertraline (ZOLOFT) 50 MG tablet Take 3 tablets (150 mg total) by mouth daily. 10/24/14   Niel Hummer, NP    Family History No family history on file.  Social History Social History   Tobacco Use  . Smoking status: Current Every Day Smoker    Packs/day: 1.00    Years: 25.00    Pack years: 25.00     Types: Cigarettes  . Smokeless tobacco: Never Used  Substance Use Topics  . Alcohol use: No  . Drug use: No     Allergies   Keflex [cephalexin] and Penicillins   Review of Systems Review of Systems  All other systems reviewed and are negative.    Physical Exam Updated Vital Signs BP 136/75   Pulse 98   Temp 98.5 F (36.9 C) (Oral)   Resp 16   SpO2 98%   Physical Exam  Constitutional: She appears well-developed and well-nourished.  HENT:  Head: Normocephalic and atraumatic.  Right Ear: External ear normal.  Left Ear: External ear normal.  Nose: Nose normal.  Mouth/Throat: Uvula is midline, oropharynx is clear and moist and mucous membranes are normal. No tonsillar exudate.  The patient has normal phonation and is in control of secretions. No stridor.  Midline uvula without edema. Soft palate rises symmetrically.  No tonsillar erythema or exudates. No PTA. Tongue protrusion is normal. No trismus. No creptius on neck palpation and patient has good dentition. No gingival erythema or fluctuance noted. Mucus membranes moist.   Eyes: Pupils are equal, round, and reactive to light. Right eye exhibits no discharge. Left eye exhibits no discharge. No scleral icterus.  Neck: Trachea normal. Neck supple. No spinous process tenderness present. No neck rigidity. Normal range of motion present.  No stridor. No nuchal rigidity or meningismus  Cardiovascular: Normal rate, regular rhythm and intact distal pulses.  No murmur heard. Pulses:      Radial pulses are 2+ on the right side, and 2+ on the left side.       Dorsalis pedis pulses are 2+ on the right side, and 2+ on the left side.       Posterior tibial pulses are 2+ on the right side, and 2+ on the left side.  No lower extremity swelling or edema. Calves symmetric in size bilaterally.  Pulmonary/Chest: Effort normal and breath sounds normal. She exhibits no tenderness.  Lungs CTA b/l. No increased work of breathing. No accessory  muscle use. Patient is sitting upright, speaking in full sentences without difficulty   Abdominal: Soft. Bowel sounds are normal. There is no tenderness. There is no rebound and no guarding.  Musculoskeletal: She exhibits no edema.  Lymphadenopathy:    She has no cervical adenopathy.  Neurological: She is alert.  Skin: Skin is warm and dry. No rash noted. She is not diaphoretic.  Psychiatric: She has a normal mood and affect.  Nursing note and vitals reviewed.    ED Treatments / Results  Labs (all labs ordered are listed, but only abnormal results are displayed) Labs Reviewed - No data to  display  EKG None  Radiology No results found.  Procedures Procedures (including critical care time)  Medications Ordered in ED Medications  glucagon (human recombinant) (GLUCAGEN) injection 1 mg (has no administration in time range)     Initial Impression / Assessment and Plan / ED Course  I have reviewed the triage vital signs and the nursing notes.  Pertinent labs & imaging results that were available during my care of the patient were reviewed by me and considered in my medical decision making (see chart for details).     62 y.o. female presenting with sensation of something stuck in your throat after eating a tbone steak last night. Hx of the same in the past. No prior GI procedures or hx of esophageal stricture. No chest pain or sob.  Patient is tolerating her secretions.  No trismus.  No evidence of PTA or RPA.  Patient's vitals are reassuring.  Patient was given glucagon was provided with complete relief of discomfort.  She is now currently asymptomatic. She is p.o. challenged and passed.  No further workup indicated. Patient is not followed by a GI doctor.  Will give referral to [facility].  Return precautions discussed.  Patient appears safe for discharge.  Patient case seen and discussed with Dr. Kathrynn Humble who is in agreement with plan.   Final Clinical Impressions(s) / ED  Diagnoses   Final diagnoses:  Food impaction of esophagus, initial encounter    ED Discharge Orders    None       Jillyn Ledger, PA-C 03/20/18 [place], [place], MD 03/21/18 [phone]

## 2018-03-30 DIAGNOSIS — R0602 Shortness of breath: Secondary | ICD-10-CM | POA: Diagnosis not present

## 2018-03-30 DIAGNOSIS — F17200 Nicotine dependence, unspecified, uncomplicated: Secondary | ICD-10-CM | POA: Diagnosis not present

## 2018-03-30 DIAGNOSIS — Z6829 Body mass index (BMI) 29.0-29.9, adult: Secondary | ICD-10-CM | POA: Diagnosis not present

## 2018-03-30 DIAGNOSIS — R942 Abnormal results of pulmonary function studies: Secondary | ICD-10-CM | POA: Diagnosis not present

## 2018-03-30 DIAGNOSIS — Z87891 Personal history of nicotine dependence: Secondary | ICD-10-CM | POA: Diagnosis not present

## 2018-04-13 DIAGNOSIS — F411 Generalized anxiety disorder: Secondary | ICD-10-CM | POA: Diagnosis not present

## 2018-04-13 DIAGNOSIS — F603 Borderline personality disorder: Secondary | ICD-10-CM | POA: Diagnosis not present

## 2018-04-13 DIAGNOSIS — F331 Major depressive disorder, recurrent, moderate: Secondary | ICD-10-CM | POA: Diagnosis not present

## 2018-04-23 DIAGNOSIS — Z87898 Personal history of other specified conditions: Secondary | ICD-10-CM | POA: Diagnosis not present

## 2018-04-23 DIAGNOSIS — F329 Major depressive disorder, single episode, unspecified: Secondary | ICD-10-CM | POA: Diagnosis not present

## 2018-04-23 DIAGNOSIS — Z79899 Other long term (current) drug therapy: Secondary | ICD-10-CM | POA: Diagnosis not present

## 2018-04-23 DIAGNOSIS — Z87891 Personal history of nicotine dependence: Secondary | ICD-10-CM | POA: Diagnosis not present

## 2018-05-05 DIAGNOSIS — Z1211 Encounter for screening for malignant neoplasm of colon: Secondary | ICD-10-CM | POA: Diagnosis not present

## 2018-06-01 ENCOUNTER — Emergency Department (HOSPITAL_COMMUNITY)
Admission: EM | Admit: 2018-06-01 | Discharge: 2018-06-01 | Disposition: A | Discharge status: Home / Self Care | Payer: Medicare Other | Attending: Emergency Medicine | Admitting: Emergency Medicine

## 2018-06-01 ENCOUNTER — Encounter (HOSPITAL_COMMUNITY): Payer: Self-pay | Admitting: Emergency Medicine

## 2018-06-01 DIAGNOSIS — Y939 Activity, unspecified: Secondary | ICD-10-CM | POA: Insufficient documentation

## 2018-06-01 DIAGNOSIS — F17210 Nicotine dependence, cigarettes, uncomplicated: Secondary | ICD-10-CM | POA: Insufficient documentation

## 2018-06-01 DIAGNOSIS — Y92009 Unspecified place in unspecified non-institutional (private) residence as the place of occurrence of the external cause: Secondary | ICD-10-CM | POA: Insufficient documentation

## 2018-06-01 DIAGNOSIS — X58XXXA Exposure to other specified factors, initial encounter: Secondary | ICD-10-CM | POA: Insufficient documentation

## 2018-06-01 DIAGNOSIS — S0502XA Injury of conjunctiva and corneal abrasion without foreign body, left eye, initial encounter: Secondary | ICD-10-CM | POA: Insufficient documentation

## 2018-06-01 DIAGNOSIS — Z85828 Personal history of other malignant neoplasm of skin: Secondary | ICD-10-CM | POA: Insufficient documentation

## 2018-06-01 DIAGNOSIS — Z79899 Other long term (current) drug therapy: Secondary | ICD-10-CM | POA: Insufficient documentation

## 2018-06-01 DIAGNOSIS — R52 Pain, unspecified: Secondary | ICD-10-CM | POA: Diagnosis not present

## 2018-06-01 DIAGNOSIS — Y999 Unspecified external cause status: Secondary | ICD-10-CM | POA: Diagnosis not present

## 2018-06-01 DIAGNOSIS — S0592XA Unspecified injury of left eye and orbit, initial encounter: Secondary | ICD-10-CM | POA: Diagnosis present

## 2018-06-01 MED ORDER — KETOROLAC TROMETHAMINE 0.5 % OP SOLN
1.0000 [drp] | Freq: Four times a day (QID) | OPHTHALMIC | Status: DC | PRN
  Administered 2018-06-01: 1 [drp] via OPHTHALMIC
  Filled 2018-06-01: qty 5

## 2018-06-01 MED ORDER — GATIFLOXACIN 0.5 % OP SOLN
1.0000 [drp] | Freq: Four times a day (QID) | OPHTHALMIC | Status: DC
  Administered 2018-06-01: 1 [drp] via OPHTHALMIC
  Filled 2018-06-01: qty 2.5

## 2018-06-01 MED ORDER — PROPARACAINE HCL 0.5 % OP SOLN
1.0000 [drp] | Freq: Once | OPHTHALMIC | Status: AC
  Administered 2018-06-01: 1 [drp] via OPHTHALMIC
  Filled 2018-06-01: qty 15

## 2018-06-01 MED ORDER — FLUORESCEIN SODIUM 1 MG OP STRP
1.0000 | ORAL_STRIP | Freq: Once | OPHTHALMIC | Status: AC
  Administered 2018-06-01: 1 via OPHTHALMIC
  Filled 2018-06-01: qty 1

## 2018-06-01 NOTE — ED Notes (Signed)
Bed: FB90 Expected date:  Expected time:  Means of arrival:  Comments: EMS 62 y/o/f

## 2018-06-01 NOTE — ED Triage Notes (Signed)
-  Arrives via EMS, C/C something in L eye, patient has been rubbing it extensively with EMS, EMS reported a small scratch on L eye -Was given cream for dry eye in August, was supposed to follow in September, when EMS asked patient why she did not follow up she started crying -Pt has hx of depression/ anxiety  -Vitals  -8/10 pain -BP 130/86 -P 80 -RR 20

## 2018-06-01 NOTE — ED Provider Notes (Signed)
[facility] DEPT Provider Note   CSN: [id number] Arrival date & time: 06/01/18  0344     History   Chief Complaint Chief Complaint  Patient presents with  . Eye Pain    HPI Latoya Green is a 62 y.o. female.  Patient presents to the emergency department for evaluation of left eye pain.  Patient reports symptoms have been present for 12 hours.  She reports that the home that she lives in is very dusty and she thinks she got some kind of dust or matter in her eye.  It has been very irritated and itchy and continues to have foreign body sensation present.     Past Medical History:  Diagnosis Date  . Anxiety   . Cancer ([place])    skin cancer 1997  . Depression   . Depression   . Epilepsy [facility])    1994    Patient Active Problem List   Diagnosis Date Noted  . Tobacco dependence 04/14/2015  . Suicidal ideation 10/25/2014  . Schizoaffective disorder, depressive type ([place]) 10/24/2014    Past Surgical History:  Procedure Laterality Date  . skin ca removed       OB History   No obstetric history on file.      Home Medications    Prior to Admission medications   Medication Sig Start Date End Date Taking? Authorizing Provider  carbamazepine (TEGRETOL) 200 MG tablet Take 200 mg by mouth 2 (two) times daily. 02/25/18   [provider]  Menthol (HALLS COUGH DROPS MT) Use as directed 1 lozenge in the mouth or throat as needed (for cough).    [provider]  omeprazole (PRILOSEC) 20 MG capsule Take 1 capsule (20 mg total) by mouth daily. 03/20/18   Varney Biles, MD  sertraline (ZOLOFT) 100 MG tablet Take 100 mg by mouth daily. 02/25/18   [provider]    Family History History reviewed. No pertinent family history.  Social History Social History   Tobacco Use  . Smoking status: Current Every Day Smoker    Packs/day: 1.00    Years: 25.00    Pack years: 25.00    Types: Cigarettes  . Smokeless tobacco:  Never Used  Substance Use Topics  . Alcohol use: No  . Drug use: No     Allergies   Keflex [cephalexin] and Penicillins   Review of Systems Review of Systems  Eyes: Positive for redness and itching.  All other systems reviewed and are negative.    Physical Exam Updated Vital Signs BP 129/73 (BP Location: Right Arm)   Pulse (!) 103   Temp 97.8 F (36.6 C) (Oral)   Resp 16   Ht 5\' 8"  (1.727 m)   Wt 93 kg   SpO2 96%   BMI 31.17 kg/m   Physical Exam Constitutional:      Appearance: Normal appearance.  HENT:     Head: Atraumatic.  Eyes:     Intraocular pressure: Left eye pressure is 23 mmHg. Measurements were taken using an automated tonometer.    Extraocular Movements: Extraocular movements intact.     Conjunctiva/sclera:     Left eye: Left conjunctiva is injected. Chemosis present. No exudate or hemorrhage.    Pupils: Pupils are equal, round, and reactive to light.     Slit lamp exam:    Left eye: Photophobia present. No corneal flare, corneal ulcer, foreign body, hyphema or hypopyon.     Comments: Single 2 mm area of uptake over  central cornea consistent with abrasion.  Area not visible with white light, no evidence of ulceration  Neurological:     Mental Status: She is alert.      ED Treatments / Results  Labs (all labs ordered are listed, but only abnormal results are displayed) Labs Reviewed - No data to display  EKG None  Radiology No results found.  Procedures Procedures (including critical care time)  Medications Ordered in ED Medications  gatifloxacin (ZYMAXID) 0.5 % ophthalmic drops 1 drop (has no administration in time range)  ketorolac (ACULAR) 0.5 % ophthalmic solution 1 drop (has no administration in time range)  proparacaine (ALCAINE) 0.5 % ophthalmic solution 1 drop (1 drop Left Eye Given 06/01/18 0420)  fluorescein ophthalmic strip 1 strip (1 strip Left Eye Given 06/01/18 0420)     Initial Impression / Assessment and Plan / ED  Course  I have reviewed the triage vital signs and the nursing notes.  Pertinent labs & imaging results that were available during my care of the patient were reviewed by me and considered in my medical decision making (see chart for details).     Slit-lamp examination and fluorescein exam with Sherral Hammers lamp reveals small corneal abrasion, no persistent foreign body, remainder of exam unremarkable  Final Clinical Impressions(s) / ED Diagnoses   Final diagnoses:  Abrasion of left cornea, initial encounter    ED Discharge Orders    None       Rossie Bretado, Gwenyth Allegra, MD 06/01/18 [phone]

## 2018-06-01 NOTE — Discharge Instructions (Addendum)
Schedule follow-up with your eye doctor if not improved in 1 or 2 days.

## 2018-07-01 DIAGNOSIS — G40909 Epilepsy, unspecified, not intractable, without status epilepticus: Secondary | ICD-10-CM | POA: Diagnosis not present

## 2018-07-01 DIAGNOSIS — F329 Major depressive disorder, single episode, unspecified: Secondary | ICD-10-CM | POA: Diagnosis not present

## 2018-07-01 DIAGNOSIS — F419 Anxiety disorder, unspecified: Secondary | ICD-10-CM | POA: Diagnosis not present

## 2018-07-01 DIAGNOSIS — Z79899 Other long term (current) drug therapy: Secondary | ICD-10-CM | POA: Diagnosis not present

## 2018-07-01 DIAGNOSIS — Z87891 Personal history of nicotine dependence: Secondary | ICD-10-CM | POA: Diagnosis not present

## 2018-07-01 DIAGNOSIS — F17200 Nicotine dependence, unspecified, uncomplicated: Secondary | ICD-10-CM | POA: Diagnosis not present

## 2018-09-08 DIAGNOSIS — Z1211 Encounter for screening for malignant neoplasm of colon: Secondary | ICD-10-CM | POA: Diagnosis not present

## 2018-09-30 DIAGNOSIS — F329 Major depressive disorder, single episode, unspecified: Secondary | ICD-10-CM | POA: Diagnosis not present

## 2018-09-30 DIAGNOSIS — Z79899 Other long term (current) drug therapy: Secondary | ICD-10-CM | POA: Diagnosis not present

## 2018-09-30 DIAGNOSIS — G40909 Epilepsy, unspecified, not intractable, without status epilepticus: Secondary | ICD-10-CM | POA: Diagnosis not present

## 2018-09-30 DIAGNOSIS — F17210 Nicotine dependence, cigarettes, uncomplicated: Secondary | ICD-10-CM | POA: Diagnosis not present

## 2018-09-30 DIAGNOSIS — F419 Anxiety disorder, unspecified: Secondary | ICD-10-CM | POA: Diagnosis not present

## 2018-12-30 DIAGNOSIS — G40909 Epilepsy, unspecified, not intractable, without status epilepticus: Secondary | ICD-10-CM | POA: Diagnosis not present

## 2018-12-30 DIAGNOSIS — F419 Anxiety disorder, unspecified: Secondary | ICD-10-CM | POA: Diagnosis not present

## 2018-12-30 DIAGNOSIS — Z79899 Other long term (current) drug therapy: Secondary | ICD-10-CM | POA: Diagnosis not present

## 2018-12-30 DIAGNOSIS — F329 Major depressive disorder, single episode, unspecified: Secondary | ICD-10-CM | POA: Diagnosis not present

## 2018-12-30 DIAGNOSIS — Z1159 Encounter for screening for other viral diseases: Secondary | ICD-10-CM | POA: Diagnosis not present

## 2019-01-25 DIAGNOSIS — H2513 Age-related nuclear cataract, bilateral: Secondary | ICD-10-CM | POA: Diagnosis not present

## 2019-01-25 DIAGNOSIS — H5203 Hypermetropia, bilateral: Secondary | ICD-10-CM | POA: Diagnosis not present

## 2019-03-18 DIAGNOSIS — Z1211 Encounter for screening for malignant neoplasm of colon: Secondary | ICD-10-CM | POA: Diagnosis not present

## 2019-03-23 DIAGNOSIS — R0602 Shortness of breath: Secondary | ICD-10-CM | POA: Diagnosis not present

## 2019-03-23 DIAGNOSIS — G40909 Epilepsy, unspecified, not intractable, without status epilepticus: Secondary | ICD-10-CM | POA: Diagnosis not present

## 2019-03-23 DIAGNOSIS — Z23 Encounter for immunization: Secondary | ICD-10-CM | POA: Diagnosis not present

## 2019-03-23 DIAGNOSIS — F17210 Nicotine dependence, cigarettes, uncomplicated: Secondary | ICD-10-CM | POA: Diagnosis not present

## 2019-03-23 DIAGNOSIS — J449 Chronic obstructive pulmonary disease, unspecified: Secondary | ICD-10-CM | POA: Diagnosis not present

## 2019-04-20 DIAGNOSIS — Z1211 Encounter for screening for malignant neoplasm of colon: Secondary | ICD-10-CM | POA: Diagnosis not present

## 2019-04-20 DIAGNOSIS — Z1212 Encounter for screening for malignant neoplasm of rectum: Secondary | ICD-10-CM | POA: Diagnosis not present

## 2019-05-19 DIAGNOSIS — K219 Gastro-esophageal reflux disease without esophagitis: Secondary | ICD-10-CM | POA: Diagnosis not present

## 2019-05-19 DIAGNOSIS — F17210 Nicotine dependence, cigarettes, uncomplicated: Secondary | ICD-10-CM | POA: Diagnosis not present

## 2019-05-19 DIAGNOSIS — F419 Anxiety disorder, unspecified: Secondary | ICD-10-CM | POA: Diagnosis not present

## 2019-05-19 DIAGNOSIS — G40909 Epilepsy, unspecified, not intractable, without status epilepticus: Secondary | ICD-10-CM | POA: Diagnosis not present

## 2019-08-17 ENCOUNTER — Ambulatory Visit: Payer: Medicare Other | Admitting: Obstetrics and Gynecology

## 2019-09-28 ENCOUNTER — Ambulatory Visit: Payer: Medicare Other

## 2019-10-05 ENCOUNTER — Ambulatory Visit: Payer: Medicare Other

## 2019-10-12 ENCOUNTER — Ambulatory Visit: Payer: Medicare Other

## 2019-10-18 ENCOUNTER — Ambulatory Visit: Payer: Medicare Other | Admitting: Obstetrics

## 2019-11-01 ENCOUNTER — Ambulatory Visit: Payer: Medicare Other | Admitting: Obstetrics

## 2020-02-16 ENCOUNTER — Telehealth: Payer: Self-pay | Admitting: *Deleted

## 2020-02-16 NOTE — Telephone Encounter (Signed)
Called the patient and scheduled a new patient appt for 9/8 at 11:15 am with Dr Berline Lopes. Gave the policy for parking, mask and visitors. Gave the address and phone number for the clinic.

## 2020-02-22 NOTE — Progress Notes (Signed)
GYNECOLOGIC ONCOLOGY NEW PATIENT CONSULTATION   Patient Name: Latoya Green  Patient Age: 64 y.o. 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  I discussed my exam findings today with the patient as well as biopsies taken.  [place]rozen review of her cervical biopsies taken today are consistent with invasive squamous cell carcinoma of the cervix.  Given what I can appreciate on her poorly tolerated exam, she has at least stage IIb disease.  I am somewhat suspicious that she may have sidewall involvement.  I am recommending that we move forward with PET scan to evaluate for metastatic disease.  In the setting of locally advanced disease, we discussed radiation with sensitizing chemotherapy as the treatment modality.  The patient voiced understanding that she is not a surgical candidate based on what I see and feel today.  Patient met with Santiago Glad, our nurse navigator, who will help facilitate visits with medical and radiation oncology.  PET scan was scheduled for next week.  The patient does not currently have a living will or power of attorney.  I asked her to think about both of these.  An example of a LivingWell was printed and given to the patient.  She identified Latoya Green, a close friend of hers who lives in [place], to be her power of attorney.  I encouraged the patient to talk to Ms. Latoya Green about filling this role.  She knows that I will call to follow-up on the results of the PET scan once back.  Once we have this information and know if there is evidence of metastatic disease, then she and I can talk more about expected prognosis.  We discussed the role of tobacco use in the pathogenesis of HPV driven cervical cancer.  I have encouraged her to decrease her tobacco intake and what ever way  possible.  A copy of this note was sent to the patient's referring provider.   65 minutes of total time was spent for this patient encounter, including preparation, face-to-face counseling with the patient and coordination of care, and documentation of the encounter.   Jeral Pinch, MD  Division of Gynecologic Oncology  Department of Obstetrics and Gynecology  [facility]  ___________________________________________  Chief Complaint: No chief complaint on file.   History of Present Illness:  Latoya Green is a 64 y.o. y.o. female who is seen in consultation at the request of Dr. Delora Fuel for an evaluation of a cervical mass.  Patient reports menopause at age 49.  She denies any postmenopausal bleeding until the very end of January of this year.  At that time, she suddenly developed bilateral low back pain that she describes as daily, constant, sharp and throbbing.  She then began having low pelvic pain and thought that she had strained a muscle.  Subsequently, she had vaginal bleeding that started.  This lasted about 5 weeks.  The bleeding occurred daily but did not require the use of a pad.  She would just see bright red blood on the toilet paper after wiping when she voided.  The patient underwent pelvic ultrasound at [facility] in late January of this year.  Echogenic concentrations were noted within the endometrial cavity which was thought to measure 8 mm at the superior aspect.  Study was limited, ovaries not appreciated.  She was diagnosed  with diabetes, but since her bleeding stopped, no further work-up was pursued.    Her back pain has continued and been consistent since that time.  It has been bad enough at times for her to think about going to the emergency department for more medication.  She notes some improvement of her back pain since seeing Dr. Delora Fuel although is still using about 8 Motrin in a 24-hour period.  She denies any vaginal  bleeding since her recent biopsy, just some foul-smelling vaginal discharge which she describes as black and dark.  Overall she endorses a good appetite with occasional nausea, no emesis.  She has developed constipation over the last 6 months but attributed this to being on multiple new medications.  She denies any urinary symptoms.  She denies fevers or chills.  She endorses about 20 pounds of weight loss which she thinks may be due to diabetes medication.  Cervical biopsy on 8/24 showed rare atypical cells in a background of necrosis, acute inflammatory exudate, and blood.  No diagnostic tissue identified.  Patient has a history of epilepsy although her last seizure was in the 1990s.  She has a history of depression diagnosed in 45s but states she has been stable on antidepressants since then.  PAST MEDICAL HISTORY:  Past Medical History:  Diagnosis Date  . Anxiety   . Cancer ([place])    skin cancer 1997  . Cervical cancer ([place])   . Depression   . Epilepsy ([place])    1994     PAST SURGICAL HISTORY:  Past Surgical History:  Procedure Laterality Date  . skin ca removed      OB/GYN HISTORY:  OB History  Gravida Para Term Preterm AB Living  1 1          SAB TAB Ectopic Multiple Live Births               # Outcome Date GA Lbr Len/2nd Weight Sex Delivery Anes PTL Lv  1 Para             No LMP recorded. Patient is postmenopausal.  Age at menarche: 69  Age at menopause: 81 Hx of HRT: no Hx of STDs: no Last pap: 01/19/20 - HSIL, HPV+ History of abnormal pap smears: Denies until recent Pap.  Prior to this year, had not had a Pap smear since the mid 1990s.  SCREENING STUDIES:  Last mammogram: 2021   MEDICATIONS: Outpatient Encounter Medications as of 02/23/2020  Medication Sig  . ACCU-CHEK GUIDE test strip USE TO CHECK BLOOD SUGAR DAILY  . Accu-Chek Softclix Lancets lancets SMARTSIG:1 Topical Daily  . carbamazepine (TEGRETOL) 200 MG tablet Take 200 mg by mouth 2 (two) times  daily.  . FEROSUL 325 (65 Fe) MG tablet Take 650 mg by mouth daily.  . hydrOXYzine (ATARAX/VISTARIL) 25 MG tablet Take 25 mg by mouth 2 (two) times daily as needed.  . latanoprost (XALATAN) 0.005 % ophthalmic solution Place 1 drop into both eyes at bedtime.  Marland Kitchen lisinopril (ZESTRIL) 2.5 MG tablet Take 2.5 mg by mouth daily.  . Menthol (HALLS COUGH DROPS MT) Use as directed 1 lozenge in the mouth or throat as needed (for cough).  . metFORMIN (GLUCOPHAGE) 1000 MG tablet Take 1 tablet by mouth daily.  . pravastatin (PRAVACHOL) 20 MG tablet Take 1 tablet by mouth daily.  . sertraline (ZOLOFT) 100 MG tablet Take 100 mg by mouth daily.  . traMADol (ULTRAM) 50 MG tablet Take 50 mg by mouth every 6 (six)  hours as needed.  . Vitamin D, Ergocalciferol, (DRISDOL) 1.25 MG (50000 UNIT) CAPS capsule Take 50,000 Units by mouth once a week.  . [DISCONTINUED] omeprazole (PRILOSEC) 20 MG capsule Take 1 capsule (20 mg total) by mouth daily.   No facility-administered encounter medications on file as of 02/23/2020.    ALLERGIES:  Allergies  Allergen Reactions  . Keflex [Cephalexin] Swelling    Pain in her throught.  . Penicillins Other (See Comments)    Childhood allergy - has never actually taken medication  Has patient had a PCN reaction causing immediate rash, facial/tongue/throat swelling, SOB or lightheadedness with hypotension: Unknown Has patient had a PCN reaction causing severe rash involving mucus membranes or skin necrosis: Unknown Has patient had a PCN reaction that required hospitalization: No Has patient had a PCN reaction occurring within the last 10 years: No If all of the above answers are "NO", then may proceed with Cephalosporin     FAMILY HISTORY:  Family History  Problem Relation Age of Onset  . Leukemia Father   . Cancer Sister        unsure type, thinks may be GYN  . Breast cancer Maternal Aunt   . Breast cancer Maternal Aunt   . Colon cancer Neg Hx      SOCIAL HISTORY:     Social Connections:   . Frequency of Communication with Friends and Family: Not on file  . Frequency of Social Gatherings with Friends and Family: Not on file  . Attends Religious Services: Not on file  . Active Member of Clubs or Organizations: Not on file  . Attends Archivist Meetings: Not on file  . Marital Status: Not on file    REVIEW OF SYSTEMS:  Endorses abdominal pain, constipation, intermittent nausea, recent vaginal bleeding, vaginal discharge, long history of a rash on her back, bruising/bleeding easily. Denies appetite changes, fevers, chills, fatigue, unexplained weight changes. Denies hearing loss, neck lumps or masses, mouth sores, ringing in ears or voice changes. Denies cough or wheezing.  Denies shortness of breath. Denies chest pain or palpitations. Denies leg swelling. Denies abdominal distention, blood in stools, diarrhea, vomiting, or early satiety. Denies pain with intercourse, dysuria, frequency, hematuria or incontinence. Denies hot flashes.   Denies joint pain or muscle pain/cramps. Denies itching or wounds. Denies dizziness, headaches, numbness or seizures. Denies swollen lymph nodes or glands. Denies anxiety, depression, confusion, or decreased concentration.  Physical Exam:  Vital Signs for this encounter:  Blood pressure 126/62, pulse 85, temperature (!) 97.5 F (36.4 C), temperature source Tympanic, resp. rate 18, height _0  (1.727 m), weight 172 lb 6.4 oz (78.2 kg), SpO2 99 %. Body mass index is 26.21 kg/m. General: Alert, oriented, no acute distress.  HEENT: Normocephalic, atraumatic. Sclera anicteric.  Chest: Mild expiratory wheezing, otherwise clear to auscultation bilaterally. No rhonchi, or rales. Cardiovascular: Regular rate and rhythm, no murmurs, rubs, or gallops.  Abdomen: Normoactive bowel sounds. Soft, nondistended, nontender to palpation. No masses or hepatosplenomegaly appreciated. No palpable fluid wave.  Extremities:  Grossly normal range of motion. Warm, well perfused. No edema bilaterally.  Skin: No rashes or lesions.  Back: No CVA tenderness.  No tenderness to palpation along lower back and paraspinous muscles. Lymphatics: No cervical, supraclavicular, or inguinal adenopathy.  GU:  Normal external female genitalia. No lesions. No bleeding.             Bladder/urethra:  No lesions or masses, well supported bladder  Vagina: Mildly atrophic vaginal mucosa, purulent appearing foul-smelling discharge noted upon insertion of the speculum.             Cervix: Cervix completely replaced by a 4--5 cm necrotic cavity, bleeds easily with any manipulation.  Cervical biopsy procedure: vaginal discharge removed and Betadine used to swab the cavity several times.  With the patient's verbal consent, several biopsies taken from the right lateral aspect of the cavity.  Accommodation of pressure and Monsel solution were used to achieve hemostasis.  Overall the patient tolerated the procedure well.             Uterus: Bimanual exam is poorly tolerated by the patient.  The cervix is replaced by a 4 cm dilated opening.  The cervix itself is quite bulbous posteriorly, better appreciated on rectovaginal exam.  There is some nodularity of the parametria bilaterally and the cervix itself extends at least part way to the sidewall.  Patient's pelvis is quite narrow and given difficulty with her toleration of the exam, I am unable to appreciate if the cervix extends clear to the sidewall.  Uterus itself is difficult to appreciate in terms of its size given enlarged cervix, minimally mobile.       LABORATORY AND RADIOLOGIC DATA:  Outside medical records were reviewed to synthesize the above history, along with the history and physical obtained during the visit.   Lab Results  Component Value Date   WBC 7.6 01/12/2015   HGB 13.4 01/12/2015   HCT 42.4 01/12/2015   PLT 300 01/12/2015   GLUCOSE 120 (H) 01/12/2015   CHOL 152  01/12/2015   TRIG 150 (H) 12/31/2013   HDL 58 12/31/2013   LDLCALC 53 12/31/2013   ALT 11 01/12/2015   AST 17 01/12/2015   NA 140 01/12/2015   K 4.4 01/12/2015   CL 105 01/12/2015   CREATININE 0.67 01/12/2015   BUN 5 (L) 01/12/2015   CO2 23 01/12/2015   TSH 2.562 01/12/2015   HGBA1C 6.2 (H) 12/31/2013   Pelvic ultrasound on 8/24: Uterus measures 8.3 x 4.8 x 5 cm, myometrium with irregular contour, endometrium not definitively appreciated.  Cervix noted to appear lobular with questionable complex hypoechoic area with echogenic areas within measuring 2.8 x 2.6 x 2.2 cm, some blood flow noted.  Neither ovary visualized.

## 2020-02-23 ENCOUNTER — Other Ambulatory Visit: Payer: Self-pay

## 2020-02-23 ENCOUNTER — Encounter: Payer: Self-pay | Admitting: Oncology

## 2020-02-23 ENCOUNTER — Inpatient Hospital Stay: Payer: Medicare Other | Attending: Gynecologic Oncology | Admitting: Gynecologic Oncology

## 2020-02-23 ENCOUNTER — Encounter: Payer: Self-pay | Admitting: Gynecologic Oncology

## 2020-02-23 ENCOUNTER — Telehealth: Payer: Self-pay | Admitting: *Deleted

## 2020-02-23 VITALS — BP 126/62 | HR 85 | Temp 97.5°F | Resp 18 | Ht 68.0 in | Wt 172.4 lb

## 2020-02-23 DIAGNOSIS — G40909 Epilepsy, unspecified, not intractable, without status epilepticus: Secondary | ICD-10-CM | POA: Insufficient documentation

## 2020-02-23 DIAGNOSIS — F329 Major depressive disorder, single episode, unspecified: Secondary | ICD-10-CM | POA: Insufficient documentation

## 2020-02-23 DIAGNOSIS — G8929 Other chronic pain: Secondary | ICD-10-CM

## 2020-02-23 DIAGNOSIS — Z808 Family history of malignant neoplasm of other organs or systems: Secondary | ICD-10-CM | POA: Diagnosis not present

## 2020-02-23 DIAGNOSIS — Z803 Family history of malignant neoplasm of breast: Secondary | ICD-10-CM | POA: Insufficient documentation

## 2020-02-23 DIAGNOSIS — Z7984 Long term (current) use of oral hypoglycemic drugs: Secondary | ICD-10-CM | POA: Insufficient documentation

## 2020-02-23 DIAGNOSIS — G893 Neoplasm related pain (acute) (chronic): Secondary | ICD-10-CM | POA: Diagnosis not present

## 2020-02-23 DIAGNOSIS — Z806 Family history of leukemia: Secondary | ICD-10-CM | POA: Insufficient documentation

## 2020-02-23 DIAGNOSIS — Z79899 Other long term (current) drug therapy: Secondary | ICD-10-CM | POA: Diagnosis not present

## 2020-02-23 DIAGNOSIS — F17210 Nicotine dependence, cigarettes, uncomplicated: Secondary | ICD-10-CM | POA: Insufficient documentation

## 2020-02-23 DIAGNOSIS — C531 Malignant neoplasm of exocervix: Secondary | ICD-10-CM

## 2020-02-23 DIAGNOSIS — N888 Other specified noninflammatory disorders of cervix uteri: Secondary | ICD-10-CM | POA: Insufficient documentation

## 2020-02-23 DIAGNOSIS — F419 Anxiety disorder, unspecified: Secondary | ICD-10-CM | POA: Diagnosis not present

## 2020-02-23 DIAGNOSIS — M545 Low back pain, unspecified: Secondary | ICD-10-CM

## 2020-02-23 NOTE — Progress Notes (Signed)
Met with Latoya Green and provided her with the [employer] folder and information about advance directives.    Also scheduled appointments to see Dr. Sondra Come on 02/29/20 (nurse eval at 12:30, consult at 1:00) and to see Dr. Alvy Bimler on 03/01/20 at 2:00 (arrive at 1:30 and can bring 1 family member).

## 2020-02-23 NOTE — Patient Instructions (Signed)
It was a pleasure meeting you today. I am going to get you set up to have a special full body scan, called a PET scan. This will show [place] if the cancer has spread beyond the cervix. I am also going to work on getting you scheduled to see our radiation doctor and medical oncologist.

## 2020-02-23 NOTE — Telephone Encounter (Signed)
Attempted to reach the patient to schedule an appt, left a message to call the office back to schedule

## 2020-02-24 LAB — SURGICAL PATHOLOGY

## 2020-02-25 NOTE — Progress Notes (Signed)
Error

## 2020-02-28 ENCOUNTER — Ambulatory Visit (HOSPITAL_COMMUNITY): Payer: Medicare Other

## 2020-02-29 ENCOUNTER — Telehealth: Payer: Self-pay | Admitting: Oncology

## 2020-02-29 ENCOUNTER — Ambulatory Visit: Payer: Medicare Other

## 2020-02-29 ENCOUNTER — Ambulatory Visit
Admission: RE | Admit: 2020-02-29 | Discharge: 2020-02-29 | Disposition: A | Discharge status: Home / Self Care | Payer: Medicare Other | Source: Ambulatory Visit | Attending: Radiation Oncology | Admitting: Radiation Oncology

## 2020-02-29 NOTE — Telephone Encounter (Signed)
Called [place] and rescheduled her appointment to see Dr. Sondra Come to 03/06/20.  She verbalized understanding and agreement.

## 2020-02-29 NOTE — Telephone Encounter (Signed)
Called Latoya Green regarding missed PET scan yesterday.  Advised her that it has been rescheduled to Friday, 03/03/20 at 9 am (arrival at 9 am, water only 6 hours prior, no carbs 12 hours prior).  Also gave her central scheduling's number to call if she needs to cancel the appointment.

## 2020-03-01 ENCOUNTER — Encounter: Payer: Self-pay | Admitting: Hematology and Oncology

## 2020-03-01 ENCOUNTER — Inpatient Hospital Stay (HOSPITAL_BASED_OUTPATIENT_CLINIC_OR_DEPARTMENT_OTHER): Payer: Medicare Other | Admitting: Hematology and Oncology

## 2020-03-01 ENCOUNTER — Other Ambulatory Visit: Payer: Self-pay

## 2020-03-01 DIAGNOSIS — C531 Malignant neoplasm of exocervix: Secondary | ICD-10-CM | POA: Diagnosis not present

## 2020-03-01 DIAGNOSIS — F17200 Nicotine dependence, unspecified, uncomplicated: Secondary | ICD-10-CM

## 2020-03-01 DIAGNOSIS — G893 Neoplasm related pain (acute) (chronic): Secondary | ICD-10-CM

## 2020-03-01 MED ORDER — MORPHINE SULFATE 15 MG PO TABS: 15.0000 mg | ORAL_TABLET | ORAL | 0 refills | Status: DC | PRN

## 2020-03-01 NOTE — Progress Notes (Signed)
[facility] CONSULT NOTE  Patient Care Team: Antony Contras, MD as PCP - General (Family Medicine) Awanda Mink Craige Cotta, RN as Oncology Nurse Navigator (Oncology)  ASSESSMENT & PLAN:  Malignant neoplasm of exocervix [name] Unfortunately, due to missed PET CT scan, staging is unknown PET scan has been scheduled for Friday Assuming she has locally advanced disease but no evidence of metastatic spread, this could be treated and potentially cured with concurrent chemoradiation therapy In situation with localized disease, I would prefer treatment with chemo sensitizing agent agent with cisplatin along with radiation treatment I briefly discussed the risk, benefits, side effects of cisplatin and she is in agreement to proceed I told her she would likely need port placement along with chemo education class before we start her on treatment I will wait until PET CT scan results are available before I placed any orders and return appointment She is scheduled to see radiation oncologist on Monday I will likely see her back next week for further follow-up including plan for chemo education class and consent  Tobacco dependence I warned her about risk of association of her cancer with smoking I recommend she quit smoking  Cancer associated pain She has poorly controlled pain I recommend a trial of morphine sulfate I warned her about risk of sedation, nausea and constipation while on treatment I will reassess pain control next week I gave her prescription of small number of pills   No orders of the defined types were placed in this encounter.   The total time spent in the appointment was 60 minutes encounter with patients including review of chart and various tests results, discussions about plan of care and coordination of care plan   All questions were answered. The patient knows to call the clinic with any problems, questions or concerns. No barriers to learning was detected.  [name], MD 9/15/20212:52 PM  CHIEF COMPLAINTS/PURPOSE OF CONSULTATION:  Cervical cancer, for further management  HISTORY OF PRESENTING ILLNESS:  Latoya Green 64 y.o. female is here because of recent diagnosis of cervical cancer The patient has poor social circumstances She has been a smoker since the age of 79, around half a pack to three quarters of packs of cigarettes per day She has been complaining of intermittent postmenopausal bleeding since early this year She also have abnormal vaginal discharge Subsequently, she started to have severe pelvic pain and seek medical attention She is taking tramadol and Motrin to help control her pain  I have reviewed her chart and materials related to her cancer extensively and collaborated history with the patient. Summary of oncologic history is as follows: Oncology History  Malignant neoplasm of exocervix ([place])  02/23/2020 Initial Biopsy   Cervical biopsies: invasive SCC on frozen review  FINAL MICROSCOPIC DIAGNOSIS:   A. CERVIX MASS, BIOPSY:  -  Invasive squamous cell carcinoma     She has not had a seizure for many years She reported poor appetite and have lost some weight She denies recent changes in bowel habits but she was told to take iron supplement and that caused intermittent constipation  MEDICAL HISTORY:  Past Medical History:  Diagnosis Date  . Anxiety   . Cancer ([place])    skin cancer 1997  . Cervical cancer ([place])   . Depression   . Epilepsy ([place])    1994    SURGICAL HISTORY: Past Surgical History:  Procedure Laterality Date  . skin ca removed      SOCIAL HISTORY: Social History  Socioeconomic History  . Marital status: Divorced    Spouse name: Not on file  . Number of children: Not on file  . Years of education: Not on file  . Highest education level: Not on file  Occupational History  . Not on file  Tobacco Use  . Smoking status: Current Every Day Smoker    Packs/day: 1.00    Years: 35.00    Pack  years: 35.00    Types: Cigarettes  . Smokeless tobacco: Never Used  Substance and Sexual Activity  . Alcohol use: No  . Drug use: No  . Sexual activity: Not Currently    Birth control/protection: Abstinence  Other Topics Concern  . Not on file  Social History Narrative  . Not on file   Social Determinants of Health   Financial Resource Strain:   . Difficulty of Paying Living Expenses: Not on file  Food Insecurity:   . Worried About Charity fundraiser in the Last Year: Not on file  . Ran Out of Food in the Last Year: Not on file  Transportation Needs:   . Lack of Transportation (Medical): Not on file  . Lack of Transportation (Non-Medical): Not on file  Physical Activity:   . Days of Exercise per Week: Not on file  . Minutes of Exercise per Session: Not on file  Stress:   . Feeling of Stress : Not on file  Social Connections:   . Frequency of Communication with Friends and Family: Not on file  . Frequency of Social Gatherings with Friends and Family: Not on file  . Attends Religious Services: Not on file  . Active Member of Clubs or Organizations: Not on file  . Attends Archivist Meetings: Not on file  . Marital Status: Not on file  Intimate Partner Violence:   . Fear of Current or Ex-Partner: Not on file  . Emotionally Abused: Not on file  . Physically Abused: Not on file  . Sexually Abused: Not on file    FAMILY HISTORY: Family History  Problem Relation Age of Onset  . Leukemia Father   . Cancer Sister        unsure type, thinks may be GYN  . Breast cancer Maternal Aunt   . Breast cancer Maternal Aunt   . Colon cancer Neg Hx     ALLERGIES:  is allergic to keflex [cephalexin] and penicillins.  MEDICATIONS:  Current Outpatient Medications  Medication Sig Dispense Refill  . ACCU-CHEK GUIDE test strip USE TO CHECK BLOOD SUGAR DAILY    . Accu-Chek Softclix Lancets lancets SMARTSIG:1 Topical Daily    . carbamazepine (TEGRETOL) 200 MG tablet Take 200  mg by mouth 2 (two) times daily.  2  . FEROSUL 325 (65 Fe) MG tablet Take 650 mg by mouth daily.    . hydrOXYzine (ATARAX/VISTARIL) 25 MG tablet Take 25 mg by mouth 2 (two) times daily as needed.    . latanoprost (XALATAN) 0.005 % ophthalmic solution Place 1 drop into both eyes at bedtime.    Marland Kitchen lisinopril (ZESTRIL) 2.5 MG tablet Take 2.5 mg by mouth daily.    . Menthol (HALLS COUGH DROPS MT) Use as directed 1 lozenge in the mouth or throat as needed (for cough).    . metFORMIN (GLUCOPHAGE) 1000 MG tablet Take 1 tablet by mouth daily.    Marland Kitchen morphine (MSIR) 15 MG tablet Take 1 tablet (15 mg total) by mouth every 4 (four) hours as needed for severe pain. 30 tablet  0  . pravastatin (PRAVACHOL) 20 MG tablet Take 1 tablet by mouth daily.    . sertraline (ZOLOFT) 100 MG tablet Take 100 mg by mouth daily.  2  . traMADol (ULTRAM) 50 MG tablet Take 50 mg by mouth every 6 (six) hours as needed.    . Vitamin D, Ergocalciferol, (DRISDOL) 1.25 MG (50000 UNIT) CAPS capsule Take 50,000 Units by mouth once a week.     No current facility-administered medications for this visit.    REVIEW OF SYSTEMS:   Constitutional: Denies fevers, chills or abnormal night sweats Eyes: Denies blurriness of vision, double vision or watery eyes Ears, nose, mouth, throat, and face: Denies mucositis or sore throat Respiratory: Denies cough, dyspnea or wheezes Cardiovascular: Denies palpitation, chest discomfort or lower extremity swelling Gastrointestinal:  Denies nausea, heartburn or change in bowel habits Skin: Denies abnormal skin rashes Lymphatics: Denies new lymphadenopathy or easy bruising Neurological:Denies numbness, tingling or new weaknesses Behavioral/Psych: Mood is stable, no new changes  All other systems were reviewed with the patient and are negative.  PHYSICAL EXAMINATION: ECOG PERFORMANCE STATUS: 1 - Symptomatic but completely ambulatory  Vitals:   03/01/20 1416  BP: (!) 147/80  Pulse: 96  Resp: 18   Temp: (!) 97.3 F (36.3 C)  SpO2: 100%   Filed Weights   03/01/20 1416  Weight: 170 lb 11.2 oz (77.4 kg)    GENERAL:alert, no distress and comfortable.   SKIN: skin color, texture, turgor are normal, no rashes or significant lesions EYES: normal, conjunctiva are pink and non-injected, sclera clear OROPHARYNX:no exudate, no erythema and lips, buccal mucosa, and tongue normal  NECK: supple, thyroid normal size, non-tender, without nodularity LYMPH:  no palpable lymphadenopathy in the cervical, axillary or inguinal LUNGS: clear to auscultation and percussion with normal breathing effort HEART: regular rate & rhythm and no murmurs and no lower extremity edema ABDOMEN:abdomen soft, non-tender and normal bowel sounds Musculoskeletal:no cyanosis of digits and no clubbing  PSYCH: alert & oriented x 3 with fluent speech NEURO: no focal motor/sensory deficits  LABORATORY DATA:  I have reviewed the data as listed Lab Results  Component Value Date   WBC 7.6 01/12/2015   HGB 13.4 01/12/2015   HCT 42.4 01/12/2015   MCV 78.4 01/12/2015   PLT 300 01/12/2015   No results for input(s): NA, K, CL, CO2, GLUCOSE, BUN, CREATININE, CALCIUM, GFRNONAA, GFRAA, PROT, ALBUMIN, AST, ALT, ALKPHOS, BILITOT, BILIDIR, IBILI in the last 8760 hours.

## 2020-03-01 NOTE — Assessment & Plan Note (Signed)
I warned her about risk of association of her cancer with smoking I recommend she quit smoking

## 2020-03-01 NOTE — Assessment & Plan Note (Signed)
She has poorly controlled pain I recommend a trial of morphine sulfate I warned her about risk of sedation, nausea and constipation while on treatment I will reassess pain control next week I gave her prescription of small number of pills

## 2020-03-01 NOTE — Assessment & Plan Note (Signed)
Unfortunately, due to missed PET CT scan, staging is unknown PET scan has been scheduled for Friday Assuming she has locally advanced disease but no evidence of metastatic spread, this could be treated and potentially cured with concurrent chemoradiation therapy In situation with localized disease, I would prefer treatment with chemo sensitizing agent agent with cisplatin along with radiation treatment I briefly discussed the risk, benefits, side effects of cisplatin and she is in agreement to proceed I told her she would likely need port placement along with chemo education class before we start her on treatment I will wait until PET CT scan results are available before I placed any orders and return appointment She is scheduled to see radiation oncologist on Monday I will likely see her back next week for further follow-up including plan for chemo education class and consent

## 2020-03-03 ENCOUNTER — Ambulatory Visit (HOSPITAL_COMMUNITY): Admission: RE | Admit: 2020-03-03 | Payer: Medicare Other | Source: Ambulatory Visit

## 2020-03-06 ENCOUNTER — Ambulatory Visit: Payer: Medicare Other

## 2020-03-06 ENCOUNTER — Ambulatory Visit
Admission: RE | Admit: 2020-03-06 | Discharge: 2020-03-06 | Disposition: A | Discharge status: Home / Self Care | Payer: Medicare Other | Source: Ambulatory Visit | Attending: Radiation Oncology | Admitting: Radiation Oncology

## 2020-03-09 ENCOUNTER — Ambulatory Visit (HOSPITAL_COMMUNITY)
Admission: RE | Admit: 2020-03-09 | Discharge: 2020-03-09 | Disposition: A | Discharge status: Home / Self Care | Payer: Medicare Other | Source: Ambulatory Visit | Attending: Gynecologic Oncology | Admitting: Gynecologic Oncology

## 2020-03-09 ENCOUNTER — Telehealth: Payer: Self-pay | Admitting: Oncology

## 2020-03-09 ENCOUNTER — Other Ambulatory Visit: Payer: Self-pay

## 2020-03-09 DIAGNOSIS — K449 Diaphragmatic hernia without obstruction or gangrene: Secondary | ICD-10-CM | POA: Insufficient documentation

## 2020-03-09 DIAGNOSIS — I2510 Atherosclerotic heart disease of native coronary artery without angina pectoris: Secondary | ICD-10-CM | POA: Insufficient documentation

## 2020-03-09 DIAGNOSIS — N888 Other specified noninflammatory disorders of cervix uteri: Secondary | ICD-10-CM

## 2020-03-09 DIAGNOSIS — E041 Nontoxic single thyroid nodule: Secondary | ICD-10-CM | POA: Diagnosis not present

## 2020-03-09 DIAGNOSIS — R911 Solitary pulmonary nodule: Secondary | ICD-10-CM | POA: Diagnosis not present

## 2020-03-09 LAB — GLUCOSE, CAPILLARY: Glucose-Capillary: 132 mg/dL — ABNORMAL HIGH (ref 70–99)

## 2020-03-09 MED ORDER — FLUDEOXYGLUCOSE F - 18 (FDG) INJECTION
8.8000 | Freq: Once | INTRAVENOUS | Status: AC | PRN
  Administered 2020-03-09: 8.8 via INTRAVENOUS

## 2020-03-09 MED ORDER — FLUDEOXYGLUCOSE F - 18 (FDG) INJECTION: 808.0000 | Freq: Once | INTRAVENOUS | Status: DC | PRN

## 2020-03-09 NOTE — Telephone Encounter (Signed)
Latoya Green and reviewed upcoming appointments and answered her questions about radiation and chemotherapy treatments.  She would also like to be referred to the transportation program.

## 2020-03-10 NOTE — Progress Notes (Signed)
Error

## 2020-03-13 ENCOUNTER — Ambulatory Visit: Payer: Medicare Other

## 2020-03-13 ENCOUNTER — Ambulatory Visit
Admission: RE | Admit: 2020-03-13 | Discharge: 2020-03-13 | Disposition: A | Discharge status: Home / Self Care | Payer: Medicare Other | Source: Ambulatory Visit | Attending: Radiation Oncology | Admitting: Radiation Oncology

## 2020-03-13 ENCOUNTER — Encounter: Payer: Self-pay | Admitting: Oncology

## 2020-03-13 ENCOUNTER — Telehealth: Payer: Self-pay | Admitting: Hematology and Oncology

## 2020-03-13 ENCOUNTER — Other Ambulatory Visit: Payer: Self-pay | Admitting: Oncology

## 2020-03-13 ENCOUNTER — Other Ambulatory Visit: Payer: Self-pay | Admitting: Hematology and Oncology

## 2020-03-13 ENCOUNTER — Telehealth: Payer: Self-pay | Admitting: Oncology

## 2020-03-13 DIAGNOSIS — C531 Malignant neoplasm of exocervix: Secondary | ICD-10-CM

## 2020-03-13 NOTE — Progress Notes (Signed)
Gynecologic Oncology Multi-Disciplinary Disposition Conference Note  Date of the Conference: 03/13/2020  Patient Name: Latoya Green  Referring Provider: Dr. Delora Fuel Primary GYN Oncologist: Dr. 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 Disposition is to referral to urology, MRI of the pelvis to check for bladder involvement followed by radiation with sensitizing chemotherapy.   This Multidisciplinary conference took place involving physicians from [place], [place], Radiation Oncology, Pathology, Radiology along with the Gynecologic Oncology Nurse Practitioner and RN.  Comprehensive assessment of the patient's malignancy, staging, need for surgery, chemotherapy, radiation therapy, and need for further testing were reviewed. Supportive measures, both inpatient and following discharge were also discussed. The recommended plan of care is documented. Greater than 35 minutes were spent correlating and coordinating this patient's care.

## 2020-03-13 NOTE — Progress Notes (Signed)
START OFF PATHWAY REGIMEN - Other   [id number]:Cisplatin 35 mg/m2 q7 Days + RT:   A cycle is every 7 days:     Cisplatin   **Always confirm dose/schedule in your pharmacy ordering system**  Patient Characteristics: Intent of Therapy: Curative Intent, Discussed with Patient 

## 2020-03-13 NOTE — Progress Notes (Signed)
Latoya Green left a message saying that she was having issues with transportation and needs to reschedule her appointment today.  Called her back and advised her of [place] recommendations and also reviewed her PET scan results.  Advised her that a referral will be sent to [facility] and she should receive a call today with an appointment.  Also that we will be scheduling a MRI of the pelvis and will notify her with that appointment.  Discussed that we will reschedule her radiation oncology appointment once these appointments have been scheduled. She verbalized understanding and agreement of plan.

## 2020-03-13 NOTE — Telephone Encounter (Signed)
Scheduled appt per 9/27 sch msg - pt is aware of appt date and time    

## 2020-03-13 NOTE — Telephone Encounter (Signed)
Latoya Green with appointment for MRI of the pelvis.  It will be 03/21/20 at 1:00 with 12:30 arrival.  She verbalized agreement and also said she has an appointment with [facility] on 03/16/20 at 11:45.

## 2020-03-15 ENCOUNTER — Telehealth: Payer: Self-pay | Admitting: Oncology

## 2020-03-15 NOTE — Telephone Encounter (Signed)
Latoya Green called and wanted to review her upcoming appointments.  Went over her schedule including [facility] tomorrow with Dr. Tresa Moore.  Also asked if she is having any vaginal bleeding and she said she is having more discharge and sees an occasional drop of blood in it.

## 2020-03-15 NOTE — Telephone Encounter (Signed)
Latoya Green called and said she is having trouble with constipation.  She was finally able to have 2 bowel movements last night but is wondering what she needs to do going forward.  Advised her to try Miralax once a day and Senakot up to 3 times a day.  She verbalized agreement and understanding.

## 2020-03-17 ENCOUNTER — Inpatient Hospital Stay: Payer: Medicare Other | Admitting: Hematology and Oncology

## 2020-03-17 ENCOUNTER — Inpatient Hospital Stay: Payer: Medicare Other

## 2020-03-17 ENCOUNTER — Telehealth: Payer: Self-pay | Admitting: Oncology

## 2020-03-17 NOTE — Telephone Encounter (Signed)
Latoya Green called back and said she does need to cancel her appointments today.  Discussed that she should try to attend so her care is not delayed.  Latoya Green said her nerves are "frayed" with trying to get her phone fixed and she is not able to make it today.  Advised her that Dr. Alvy Bimler will be notified and the appointments will be rescheduled.  Also reviewed upcoming appointments for next week.

## 2020-03-17 NOTE — Telephone Encounter (Signed)
Latoya Green with new appointments for 03/28/20 to see Dr. Alvy Bimler and for Patient Education.  She verbalized understanding and agreement.

## 2020-03-17 NOTE — Telephone Encounter (Signed)
10/12 at 2 pm, 1 hour

## 2020-03-17 NOTE — Telephone Encounter (Signed)
Voice mail received from Latoya Green saying that she needs to get her phone fixed today and will not make her appointments today.  Called her back and the person who answered said he is aware of her appointments today and she will be here today.

## 2020-03-20 ENCOUNTER — Telehealth: Payer: Self-pay | Admitting: Oncology

## 2020-03-20 ENCOUNTER — Other Ambulatory Visit: Payer: Self-pay | Admitting: Urology

## 2020-03-20 NOTE — Telephone Encounter (Signed)
Judithann called and gave an additional cell phone number - [phone].  She would like to use the transportation service for her MRI tomorrow.  Again reviewed appointments for the week.  Also sent an email to the transportation coordinator.

## 2020-03-21 ENCOUNTER — Other Ambulatory Visit: Payer: Self-pay

## 2020-03-21 ENCOUNTER — Other Ambulatory Visit: Payer: Self-pay | Admitting: Radiation Oncology

## 2020-03-21 ENCOUNTER — Telehealth: Payer: Self-pay | Admitting: *Deleted

## 2020-03-21 ENCOUNTER — Telehealth: Payer: Self-pay | Admitting: Oncology

## 2020-03-21 ENCOUNTER — Ambulatory Visit (HOSPITAL_COMMUNITY)
Admission: RE | Admit: 2020-03-21 | Discharge: 2020-03-21 | Disposition: A | Discharge status: Home / Self Care | Payer: Medicare Other | Source: Ambulatory Visit | Attending: Radiation Oncology | Admitting: Radiation Oncology

## 2020-03-21 ENCOUNTER — Encounter: Payer: Self-pay | Admitting: Oncology

## 2020-03-21 DIAGNOSIS — C531 Malignant neoplasm of exocervix: Secondary | ICD-10-CM | POA: Diagnosis not present

## 2020-03-21 NOTE — Telephone Encounter (Signed)
Patient called "I would like to talk with Dr Berline Lopes about having a natural death. Dr Tresa Moore told me either way I decide to go I would have to have the procedure done by him. I don't know know if I want to have chemotherapy and radiation. It cost a lot to stay alive. I'm ready to meet my maker." Explained that Dr Berline Lopes is out of the office and the message will be given to [facility] APP.

## 2020-03-21 NOTE — Progress Notes (Signed)
Called Gwinda Maine, CSW to reach out to patient regarding anxiety and treatment decisions.

## 2020-03-21 NOTE — Telephone Encounter (Signed)
Called Latoya Green to see how she is doing today.  She said she is trying to decide between "a natural death" and treatment.  She is worried about paying for a cab everyday and about depleting her savings.  She also mentioned that she has a procedure (cystoscopy and stent placement) with Dr. Tresa Moore scheduled on 04/05/20. She plans to have the procedure even if she decides on a natural death.  Advised her to try the transportation program today to get to her MRI appointment and to call me if she does not hear from them.  Also discussed the [employer] and that I will have the Financial Counselors contact her to see if she qualifies.  Discussed that we will help her get through treatment and to discuss this with Dr. Sondra Come and Dr. Alvy Bimler at her next appointments.

## 2020-03-22 ENCOUNTER — Other Ambulatory Visit: Payer: Self-pay

## 2020-03-22 ENCOUNTER — Ambulatory Visit
Admission: RE | Admit: 2020-03-22 | Discharge: 2020-03-22 | Disposition: A | Discharge status: Home / Self Care | Payer: Medicare Other | Source: Ambulatory Visit | Attending: Radiation Oncology | Admitting: Radiation Oncology

## 2020-03-22 ENCOUNTER — Encounter: Payer: Self-pay | Admitting: Radiation Oncology

## 2020-03-22 ENCOUNTER — Encounter: Payer: Self-pay | Admitting: *Deleted

## 2020-03-22 VITALS — BP 150/89 | HR 93 | Temp 98.3°F | Resp 16 | Ht 68.0 in | Wt 168.0 lb

## 2020-03-22 DIAGNOSIS — F17210 Nicotine dependence, cigarettes, uncomplicated: Secondary | ICD-10-CM | POA: Insufficient documentation

## 2020-03-22 DIAGNOSIS — Z803 Family history of malignant neoplasm of breast: Secondary | ICD-10-CM | POA: Diagnosis not present

## 2020-03-22 DIAGNOSIS — N1330 Unspecified hydronephrosis: Secondary | ICD-10-CM | POA: Diagnosis not present

## 2020-03-22 DIAGNOSIS — G40909 Epilepsy, unspecified, not intractable, without status epilepticus: Secondary | ICD-10-CM | POA: Diagnosis not present

## 2020-03-22 DIAGNOSIS — K5730 Diverticulosis of large intestine without perforation or abscess without bleeding: Secondary | ICD-10-CM | POA: Insufficient documentation

## 2020-03-22 DIAGNOSIS — C531 Malignant neoplasm of exocervix: Secondary | ICD-10-CM | POA: Insufficient documentation

## 2020-03-22 DIAGNOSIS — K449 Diaphragmatic hernia without obstruction or gangrene: Secondary | ICD-10-CM | POA: Diagnosis not present

## 2020-03-22 DIAGNOSIS — Z79899 Other long term (current) drug therapy: Secondary | ICD-10-CM | POA: Insufficient documentation

## 2020-03-22 DIAGNOSIS — F418 Other specified anxiety disorders: Secondary | ICD-10-CM | POA: Insufficient documentation

## 2020-03-22 DIAGNOSIS — I2510 Atherosclerotic heart disease of native coronary artery without angina pectoris: Secondary | ICD-10-CM | POA: Insufficient documentation

## 2020-03-22 DIAGNOSIS — Z7984 Long term (current) use of oral hypoglycemic drugs: Secondary | ICD-10-CM | POA: Diagnosis not present

## 2020-03-22 DIAGNOSIS — F329 Major depressive disorder, single episode, unspecified: Secondary | ICD-10-CM | POA: Insufficient documentation

## 2020-03-22 LAB — CBC WITH DIFFERENTIAL/PLATELET
Abs Immature Granulocytes: 0.03 10*3/uL (ref 0.00–0.07)
Basophils Absolute: 0 10*3/uL (ref 0.0–0.1)
Basophils Relative: 0 %
Eosinophils Absolute: 0.3 10*3/uL (ref 0.0–0.5)
Eosinophils Relative: 2 %
HCT: 30.5 % — ABNORMAL LOW (ref 36.0–46.0)
Hemoglobin: 9.3 g/dL — ABNORMAL LOW (ref 12.0–15.0)
Immature Granulocytes: 0 %
Lymphocytes Relative: 17 %
Lymphs Abs: 2.1 10*3/uL (ref 0.7–4.0)
MCH: 22.5 pg — ABNORMAL LOW (ref 26.0–34.0)
MCHC: 30.5 g/dL (ref 30.0–36.0)
MCV: 73.7 fL — ABNORMAL LOW (ref 80.0–100.0)
Monocytes Absolute: 0.7 10*3/uL (ref 0.1–1.0)
Monocytes Relative: 6 %
Neutro Abs: 9 10*3/uL — ABNORMAL HIGH (ref 1.7–7.7)
Neutrophils Relative %: 75 %
Platelets: 417 10*3/uL — ABNORMAL HIGH (ref 150–400)
RBC: 4.14 MIL/uL (ref 3.87–5.11)
RDW: 17.2 % — ABNORMAL HIGH (ref 11.5–15.5)
WBC: 12.2 10*3/uL — ABNORMAL HIGH (ref 4.0–10.5)
nRBC: 0 % (ref 0.0–0.2)

## 2020-03-22 LAB — COMPREHENSIVE METABOLIC PANEL
ALT: 12 U/L (ref 0–44)
AST: 13 U/L — ABNORMAL LOW (ref 15–41)
Albumin: 3.5 g/dL (ref 3.5–5.0)
Alkaline Phosphatase: 91 U/L (ref 38–126)
Anion gap: 6 (ref 5–15)
BUN: 10 mg/dL (ref 8–23)
CO2: 27 mmol/L (ref 22–32)
Calcium: 9.4 mg/dL (ref 8.9–10.3)
Chloride: 103 mmol/L (ref 98–111)
Creatinine, Ser: 0.97 mg/dL (ref 0.44–1.00)
GFR calc non Af Amer: >60 mL/min (ref >60)
Glucose, Bld: 115 mg/dL — ABNORMAL HIGH (ref 70–99)
Potassium: 4.1 mmol/L (ref 3.5–5.1)
Sodium: 136 mmol/L (ref 135–145)
Total Bilirubin: 0.2 mg/dL — ABNORMAL LOW (ref 0.3–1.2)
Total Protein: 7.3 g/dL (ref 6.5–8.1)

## 2020-03-22 LAB — MAGNESIUM: Magnesium: 1.8 mg/dL (ref 1.7–2.4)

## 2020-03-22 NOTE — Progress Notes (Signed)
Radiation Oncology         [phone]) [phone] ________________________________  Initial Outpatient Consultation  Name: Latoya Green MRN: [id number]  Date: 03/22/2020  DOB: 04/18/1956  GE:XBMWUX, Apolonio Schneiders, MD  Lafonda Mosses, MD   REFERRING PHYSICIAN: Lafonda Mosses, MD  DIAGNOSIS: The encounter diagnosis was Malignant neoplasm of exocervix [facility]).  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 female who is seen as a courtesy of Dr. Berline Lopes for an opinion concerning radiation therapy as part of management for her recently diagnosed cervical cancer. The patient presented to Dr. Delora Fuel in January of this year after experiencing sudden bilateral low back pain, pelvic pain, and vaginal bleeding. She underwent a pelvic ultrasound in late January at [facility] that showed echogenic concentrations within the endometrial cavity, which was thought to measure 8 mm at the superior aspect. The study was limited and the ovaries were not appreciated. Given that her vaginal bleeding had spontaneously resolved, no further work-up was pursued at that time.  The patient continued to have low back pain and developed malodorous dark vaginal discharge. Thus, she underwent a cervical biopsy on 02/08/2020 that showed rare atypical cells in the background of necrosis, acute inflammatory exudate, and blood. No diagnostic tissue was identified. The patient was referred to Dr. Berline Lopes and was seen in consultation on 02/23/2020. On examination, the cervix was completely replaced by a 4-5 cm necrotic cavity that bled easily with any manipulation. A cervical biopsy was performed at that time and showed invasive squamous cell carcinoma. Dr. Berline Lopes recommended that the patient proceed with a PET scan to evaluate for metastatic disease. In the setting of locally advanced disease, they discussed radiation with sensitizing chemotherapy as the  treatment modality.   The patient was seen in consultation with Dr. Alvy Bimler on 03/01/2020. Assuming she was to have a locally advanced disease without evidence of metastatic spread, it was recommended that she proceed with concurrent chemoradiation therapy.  PET scan on 03/09/2020 showed a circumferential cervical mass that invaded the lower uterine segment and had a maximum SUV of 22.4. The mass bulged forward into the bladder; invasion of the urinary bladder and/or adjacent ureters was a distinct possibility given the bilateral hydroureter (severe on the left and moderate on the right). 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 There was no hypermetabolic adenopathy or definite distant metastatic spread identified. However, there was a 0.7 cm right basilar pulmonary nodule in the lower lobe without perceived accentuated metabolic activity. Surveillance was recommended. Finally, there was a 1.0 cm hypodense right thyroid nodule that was hypermetabolic with a maximum SUV of 4.3. A thyroid ultrasound and biopsy was recommended.  The patient's case was presented at the Gynecologic Oncology Multi-Disciplinary Conference on 03/13/2020, during which time it was recommended that she proceed with MRI of the pelvis to check for bladder involvement followed by radiation with sensitizing chemotherapy. Additionally, she was to be referred to urology.  Patient was seen Dr. Tresa Moore in urology and the patient is scheduled for cystoscopy bilateral stent placement later this month.  MRI of pelvis on 03/21/2020 was very limited but showed a circumferential 5.4 x 3.5 x 5.1 cm uterine cervix mass that appeared to demonstrate slight parametrial invasion in the posterior right portion of the uterine cervix, and direct invasion of the bladder trigone by the cervical mass; probable focally full thickness bladder wall invasion in the left trigone. 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 There was no appreciable pelvic adenopathy or other sites of pelvic metastatic disease.  The MRI was not completed due to the patient's anxiety.  PREVIOUS RADIATION THERAPY: No  PAST MEDICAL HISTORY:  Past Medical History:  Diagnosis Date  . Anxiety   . Cancer ([place])    skin cancer 1997  . Cervical cancer ([place])   . Depression   . Epilepsy ([place])    1994    PAST SURGICAL HISTORY: Past Surgical History:  Procedure Laterality Date  . skin ca removed      FAMILY HISTORY:  Family History  Problem Relation Age of Onset  . Leukemia Father   . Cancer Sister        unsure type, thinks may be GYN  . Breast cancer Maternal Aunt   . Breast cancer Maternal Aunt   . Colon cancer Neg Hx     SOCIAL HISTORY:  Social History   Tobacco Use  . Smoking status: Current Every Day Smoker    Packs/day: 1.00    Years: 35.00    Pack years: 35.00    Types: Cigarettes  . Smokeless tobacco: Never Used  Substance Use Topics  . Alcohol use: No  . Drug use: No    ALLERGIES:  Allergies  Allergen Reactions  . Keflex [Cephalexin] Swelling    Pain in her throught.  . Penicillins Other (See Comments)    Childhood allergy - has never actually taken medication  Has patient had a PCN reaction causing immediate rash, facial/tongue/throat swelling, SOB or lightheadedness with hypotension: Unknown Has patient had a PCN reaction causing severe rash involving mucus membranes or skin necrosis: Unknown Has patient had a PCN reaction that required hospitalization: No Has patient had a PCN reaction occurring within the last 10 years: No If all of the above answers are "NO", then may proceed with Cephalosporin    MEDICATIONS:  Current Outpatient Medications  Medication Sig Dispense Refill  . ACCU-CHEK GUIDE test strip USE TO CHECK BLOOD SUGAR DAILY    .  Accu-Chek Softclix Lancets lancets SMARTSIG:1 Topical Daily    . carbamazepine (TEGRETOL) 200 MG tablet Take 200 mg by mouth 2 (two) times daily.  2  . FEROSUL 325 (65 Fe) MG tablet Take 650 mg by mouth daily.    . hydrOXYzine (ATARAX/VISTARIL) 25 MG tablet Take 25 mg by mouth 2 (two) times daily as needed.    . latanoprost (XALATAN) 0.005 % ophthalmic solution Place 1 drop into both eyes at bedtime.    Marland Kitchen lisinopril (ZESTRIL) 2.5 MG tablet Take 2.5 mg by mouth daily.    . Menthol (HALLS COUGH DROPS MT) Use as directed 1 lozenge in the mouth or throat as needed (for cough).    . metFORMIN (GLUCOPHAGE) 1000 MG tablet Take 1 tablet by mouth daily.    Marland Kitchen morphine (MSIR) 15 MG tablet Take 1 tablet (15 mg total) by mouth every 4 (four) hours as needed for severe pain. 30 tablet 0  . pravastatin (PRAVACHOL) 20 MG tablet Take 1 tablet by mouth daily.    . sertraline (ZOLOFT) 100 MG tablet Take 100 mg by mouth daily.  2  . traMADol (ULTRAM) 50 MG tablet Take 50 mg by mouth every 6 (six) hours as needed.    . Vitamin D, Ergocalciferol, (DRISDOL) 1.25 MG (50000 UNIT) CAPS capsule Take 50,000 Units by  mouth once a week.     No current facility-administered medications for this encounter.    REVIEW OF SYSTEMS:  A 10+ POINT REVIEW OF SYSTEMS WAS OBTAINED including neurology, dermatology, psychiatry, cardiac, respiratory, lymph, extremities, GI, GU, musculoskeletal, constitutional, reproductive, HEENT.  She reports low back and pelvic pain.  She has very little bleeding at this time and wears approximately 2 pads per day.  She does complain of foul-smelling vaginal drainage.   PHYSICAL EXAM:  height is 5\' 8"  (1.727 m) and weight is 168 lb (76.2 kg). Her temperature is 98.3 F (36.8 C). Her blood pressure is 150/89 (abnormal) and her pulse is 93. Her respiration is 16 and oxygen saturation is 99%.   General: Alert and oriented, in no acute distress HEENT: Head is normocephalic. Extraocular movements are intact.  Oropharynx is clear. Neck: Neck is supple, no palpable cervical or supraclavicular lymphadenopathy. Heart: Regular in rate and rhythm with no murmurs, rubs, or gallops. Chest: Clear to auscultation bilaterally, with no rhonchi, wheezes, or rales. Abdomen: Soft, nontender, nondistended, with no rigidity or guarding. Extremities: No cyanosis or edema. Lymphatics: see Neck Exam Skin: No concerning lesions. Musculoskeletal: symmetric strength and muscle tone throughout. Neurologic: Cranial nerves II through XII are grossly intact. No obvious focalities. Speech is fluent. Coordination is intact. Psychiatric: Judgment and insight are intact. Affect is appropriate. On pelvic examination the external genitalia were unremarkable. A speculum exam was performed.  Foul-smelling drainage is noted in the vaginal vault but no bleeding at this time. The cervix is replaced by a 4 cm dilated opening.  The cervix itself is quite bulbous posteriorly, better appreciated on rectovaginal exam.  No obvious rectal mucosal invasion. there is some thickening along the proximal anterior vaginal wall suspicious for bladder invasion There is some nodularity of the parametria bilaterally more so on the right side.  The patient tolerated the rectal portion of the exam very poorly.    ECOG = 1  0 - Asymptomatic (Fully active, able to carry on all predisease activities without restriction)  1 - Symptomatic but completely ambulatory (Restricted in physically strenuous activity but ambulatory and able to carry out work of a light or sedentary nature. For example, light housework, office work)  2 - Symptomatic, <50% in bed during the day (Ambulatory and capable of all self care but unable to carry out any work activities. Up and about more than 50% of waking hours)  3 - Symptomatic, >50% in bed, but not bedbound (Capable of only limited self-care, confined to bed or chair 50% or more of waking hours)  4 - Bedbound (Completely  disabled. Cannot carry on any self-care. Totally confined to bed or chair)  5 - Death   Eustace Pen MM, Creech RH, Tormey DC, et al. [id number]). "Toxicity and response criteria of the [facility]". [place] Oncol. 5 (6): 649-55  LABORATORY DATA:  Lab Results  Component Value Date   WBC 12.2 (H) 03/22/2020   HGB 9.3 (L) 03/22/2020   HCT 30.5 (L) 03/22/2020   MCV 73.7 (L) 03/22/2020   PLT 417 (H) 03/22/2020   NEUTROABS 9.0 (H) 03/22/2020   Lab Results  Component Value Date   NA 136 03/22/2020   K 4.1 03/22/2020   CL 103 03/22/2020   CO2 27 03/22/2020   GLUCOSE 115 (H) 03/22/2020   CREATININE 0.97 03/22/2020   CALCIUM 9.4 03/22/2020      RADIOGRAPHY: MR Pelvis Ltd  Result Date: 03/21/2020 CLINICAL DATA:  64 year old female  with new diagnosis of cervical cancer. Pelvic staging requested. EXAM: MRI PELVIS WITHOUT CONTRAST TECHNIQUE: Incomplete multiplanar multisequence MR imaging of the pelvis was performed. Due to patient anxiety, the examination is limited and was discontinued. Only two precontrast MR sequences of the pelvis were obtained. CONTRAST:  None. COMPARISON:  03/09/2020 PET-CT. FINDINGS: Urinary Tract: There is direct invasion of the bladder trigone by the uterine cervix mass with tumor encasement of the ureterovesical junctions bilaterally, with apparent focally full thickness bladder wall invasion in the left trigone on these limited views (series 3/image 25). Moderate right hydroureteronephrosis to the level of the right UVJ. 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 Grossly normal urethra. Bowel: The uterine cervix mass abuts the anterior rectal wall, without definite rectal wall invasion. No evidence of bowel dilatation or wall thickening. Mild sigmoid diverticulosis. Vascular/Lymphatic: No pathologically enlarged lymph nodes in the pelvis. No appreciable acute vascular  abnormality on this limited noncontrast study. Reproductive: Uterus: The anteverted uterus measures 7.1 x 3.9 x 4.6 cm. No uterine fibroids. Endometrium measures 5 mm in bilayer thickness, which is top-normal. There is a circumferential 5.4 x 3.5 x 5.1 cm mass of the uterine cervix (series 3/image 22), which appears to demonstrate slight parametrial invasion beyond the cervical fibrous stroma in the posterior right portion (series 2/image 25). There is direct invasion of the bladder trigone by the mass as detailed above. There is probable invasion of upper half of the vagina by the cervical mass, without involvement of the lower half of the vagina. Ovaries and Adnexa: No adnexal masses on these limited views. Other: No abnormal free fluid in the pelvis. No focal pelvic fluid collection. Musculoskeletal: No aggressive appearing focal osseous lesions. IMPRESSION: 1. Very limited incomplete noncontrast MRI pelvis study, see comments. 2. Circumferential 5.4 x 3.5 x 5.1 cm uterine cervix mass, which appears to demonstrate slight parametrial invasion in the posterior right portion of the uterine cervix, and direct invasion of the bladder trigone by the cervical mass. Probable focally full thickness bladder wall invasion in the left trigone on this limited study. 3. Moderate right hydroureteronephrosis to the level of the right UVJ. 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 4. No appreciable pelvic adenopathy or other sites of pelvic metastatic disease. Electronically Signed   By: Ilona Sorrel M.D.   On: 03/21/2020 14:45   NM PET Image Initial (PI) Skull Base To Thigh  Result Date: 03/10/2020 CLINICAL DATA:  Initial treatment strategy for cervical cancer. EXAM: NUCLEAR MEDICINE PET SKULL BASE TO THIGH TECHNIQUE: 8.8 mCi F-18 FDG was injected intravenously. Full-ring PET imaging was performed from the skull base to thigh after the  radiotracer. CT data was obtained and used for attenuation correction and anatomic localization. Fasting blood glucose: 132 mg/dl COMPARISON:  Chest radiograph 12/05/2013 FINDINGS: Mediastinal blood pool activity: SUV max 2.8 Liver activity: SUV max N/A NECK: Relatively symmetric physiologic activity in the palatine tonsils. 1.0 cm hypodense right thyroid nodule appears hypermetabolic maximum SUV of 4.3. Recommend thyroid US and biopsy (ref: J Am Coll Radiol. 2015 Feb;12(2): 143-50). Incidental CT findings: none CHEST: 0.7 cm right basilar pulmonary nodule in the lower lobe on image 48 series 8 without perceived accentuated metabolic activity. Incidental CT findings: Coronary, aortic arch, and branch vessel atherosclerotic vascular disease. Moderate-sized hiatal hernia. Subpleural reticular interstitial accentuation favoring the upper lobes. ABDOMEN/PELVIS: Hypermetabolic circumferential cervical mass extends  up into the lower uterine segment, maximum SUV 22.4. This mass bulges forwards towards the urinary bladder at appears to be causing bilateral ureteral obstruction in the vicinity of the ureterovesical junctions, with moderate right and prominent left hydronephrosis. The prominent left hydronephrosis involves both the upper pole and lower pole ureters of a duplicated collecting system on the left. Direct ureteral invasion by the tumor is not excluded especially along the left side where the ureters become effaced into the cervical mass. The degree of left-sided obstruction is to the point where FDG is not excreted into the left ureters/collecting system. Scattered physiologic activity in bowel. Aortocaval lymph node measuring 0.6 cm in short axis on image 136 of series 4 has maximum SUV of 3.2 which is only minimally above blood pool and accordingly not considered malignant. Incidental CT findings: Aortoiliac atherosclerotic vascular disease. SKELETON: No significant abnormal hypermetabolic activity in this  region. Incidental CT findings: none IMPRESSION: 1. Circumferential cervical mass invades the lower uterine segment has a maximum SUV of 22.4. The mass bulges forward into the bladder and invasion of the urinary bladder and/or adjacent ureters is a distinct possibility given the bilateral hydroureter (severe on the left and moderate on the right). 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 2. No hypermetabolic adenopathy or definite distant metastatic spread identified. However, there is a 0.7 cm right basilar pulmonary nodule in the lower lobe without perceived accentuated metabolic activity. This is at the borderline sensitive PET-CT assessment based on size. Surveillance recommended. 3. 1.0 cm hypodense right thyroid nodule is hypermetabolic with maximum SUV 4.3. Recommend thyroid US and biopsy (ref: J Am Coll Radiol. 2015 Feb;12(2): 143-50). 4. Other imaging findings of potential clinical significance: Aortic Atherosclerosis (ICD10-I70.0). Coronary atherosclerosis. Moderate-sized hiatal hernia. Subpleural reticular interstitial accentuation favoring the upper lobes is nonspecific. Electronically Signed   By: Van Clines M.D.   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  As above PET scan and MRI showed probable bladder invasion resulting bilateral hydronephrosis. The patient is scheduled to undergo a cystoscopy with retrograde pyelogram/ureteral stent placement on 04/05/2020 under the care of Dr. Tresa Moore to further evaluate this issue.  Today, I talked to the patient  about the findings and work-up thus far.  We discussed the natural history of cervical cancer and general treatment, highlighting the role of radiotherapy in the management.  We discussed the available radiation techniques,  and focused on the details of logistics and delivery.  We reviewed the anticipated acute and late sequelae associated with radiation in this setting.  The patient was encouraged to ask questions that I answered to the best of my ability.  We discussed that to give her the best chances for cure this would include an aggressive course of treatment including external beam radiation therapy with radiosensitizing chemotherapy as well as brachytherapy treatment.  Based on the patient's clinical exam today and radiographic imaging I am doubtful that she would be adequately treated with the tandem ring/ high-dose-rate radiation treatment due to distorted anatomy and probable bladder invasion.  I discussed with the patient that she will likely need to be seen at [facility] for possible interstitial implant as part of her overall treatment.  The patient is overwhelmed with her diagnosis as well as the extent of comprehensive treatment required  for potential cure.  She is unsure at this point whether she would agree to treatment as recommended.  PLAN: The patient is scheduled to undergo a cystoscopy with retrograde pyelogram/ureteral stent placement on 04/05/2020 under the care of Dr. Tresa Moore.  She will then be scheduled for simulation and external beam radiation treatment soon afterward, assuming she agrees to therapy.    Total time spent in this encounter was 65 minutes which included reviewing the patient's most recent consultation with Dr. Berline Lopes, consultation with Dr. Alvy Bimler, biopsies, PET scan, MRI of pelvis, physical examination, and documentation.   ------------------------------------------------  Blair Promise, PhD, MD  This document serves as a record of services personally performed by Gery Pray, MD. It was created on his behalf by Clerance Lav, a trained medical scribe. The creation of this record is based on the scribe's personal observations and the provider's statements to them. This  document has been checked and approved by the attending provider.

## 2020-03-22 NOTE — Progress Notes (Signed)
[place] Work  Clinical Social Work was referred by [employer] navigator for assessment of psychosocial needs.  Clinical Social Worker met with patient in radiation oncology exam room to offer support and assess for needs.  Patient reports no concerns regarding treatment, although physically expressed apprehension.  She indicated she has no support system and relies on her faith in God.  Patient proceeded to share she is not participating in a place of worship at this time, although she would like to.  CSW suggested patient meet with [facility]- patient reported she is not interested at this time.  CSW briefly discussed transportation program to assist with rides to treatment.  Patient was interested and plans to follow up on this request.  CSW encouraged patient to call with other needs.   Gwinda Maine, LCSW  Clinical Social Worker [facility]

## 2020-03-22 NOTE — Progress Notes (Signed)
Patient here for a consult with Dr. Sondra Come.  GYN Location of Tumor / Histology:Cervical Mass  Latoya Green presented with symptoms of: intermittent postmenopausal bleeding  Biopsies revealed: Invasive squamous cell carcinoma  Past/Anticipated interventions by Gyn/Onc surgery, if any: no  Past/Anticipated interventions by medical oncology, if any: unsure  Weight changes, if any: lost 22 lbs  Bowel/Bladder complaints, if any: problems with constipation  Nausea/Vomiting, if any: had nausea but it goes away  Pain issues, if any:  lower back pain "9"  SAFETY ISSUES: Prior radiation? no Pacemaker/ICD? no Possible current pregnancy? Postmenopausal Is the patient on methotrexate? no  Current Complaints / other details:  Patient will be having a stent placed in her ureter 10/20.  T 98.3 P 93 R 16 Sat 99 BP 150/89    Wt Readings from Last 3 Encounters:  03/22/20 168 lb (76.2 kg)  03/01/20 170 lb 11.2 oz (77.4 kg)  02/23/20 172 lb 6.4 oz (78.2 kg)

## 2020-03-28 ENCOUNTER — Inpatient Hospital Stay: Payer: Medicare Other

## 2020-03-28 ENCOUNTER — Inpatient Hospital Stay: Payer: Medicare Other | Admitting: Hematology and Oncology

## 2020-03-28 ENCOUNTER — Telehealth: Payer: Self-pay | Admitting: Oncology

## 2020-03-28 NOTE — Telephone Encounter (Signed)
Called Tashe back and she is going to cancel her appointments today.  She is going to call back tomorrow to reschedule.

## 2020-03-28 NOTE — Telephone Encounter (Signed)
Called Aja to remind her about her appointment today with Dr. Alvy Bimler.  She said she is supposed to have a dryer delivered today and is not sure what time it is going to be delivered.  She is going to call back and let us know if she will be able to attend the appointment.

## 2020-03-29 ENCOUNTER — Encounter (HOSPITAL_BASED_OUTPATIENT_CLINIC_OR_DEPARTMENT_OTHER): Payer: Self-pay | Admitting: Urology

## 2020-03-29 ENCOUNTER — Other Ambulatory Visit: Payer: Self-pay

## 2020-03-29 NOTE — Progress Notes (Addendum)
Spoke w/ via phone for pre-op interview---Alletta Mattos Hardin Negus RN  Lab needs dos----istat 8 and ekg    , [id number]             Lab results------none  COVID test ------07/03/19 1140am Arrive at -------6256 NPO after MN NO Solid Food.  Clear liquids from MN until---1215pm Medications to take morning of surgery -----tramadol parn, tegretol  Diabetic medication -----none day of procedure,  Patient Special Instructions -----none  Pre-Op special Istructions -----none  Patient verbalized understanding of instructions that were given at this phone interview. Patient denies shortness of breath, chest pain, fever, cough at this phone interview. Patient asked if she could take ibuprofen day of surgery. Instructed pt to call office of DR [facility] in regards to this.   Patient was going to take a taxi home after surgery.  Instructed pt she had ot have someone with her in the taxi home with her. Patient voiced understanding.

## 2020-04-01 ENCOUNTER — Other Ambulatory Visit (HOSPITAL_COMMUNITY)
Admission: RE | Admit: 2020-04-01 | Discharge: 2020-04-01 | Disposition: A | Discharge status: Home / Self Care | Payer: Medicare Other | Source: Ambulatory Visit | Attending: Urology | Admitting: Urology

## 2020-04-01 DIAGNOSIS — Z20822 Contact with and (suspected) exposure to covid-19: Secondary | ICD-10-CM | POA: Diagnosis not present

## 2020-04-01 DIAGNOSIS — Z01812 Encounter for preprocedural laboratory examination: Secondary | ICD-10-CM | POA: Insufficient documentation

## 2020-04-01 LAB — SARS CORONAVIRUS 2 (TAT 6-24 HRS): SARS Coronavirus 2: NEGATIVE

## 2020-04-04 ENCOUNTER — Telehealth: Payer: Self-pay | Admitting: Oncology

## 2020-04-04 NOTE — Telephone Encounter (Addendum)
Called Dorotea regarding the canceled urology procedure for tomorrow.  She said she was not been able to find anyone to go home with her after the procedure so she had to cancel.  Discussed that there are home health agencies that she can hire to provide care and that I will call her back with some recommendations.      She mentioned that she has an appointment to see Dr. Mannie Stabile (Family Medicine) at [facility] on Friday to discuss her care going forward.  Asked her if she is planning on attending her CT [facility] appointment on Monday and she said no.  She is worried because she knows that she only has a few months to live and her vision is blurry.  She is afraid that she will lose her eyesight.  When questioned about this, she said she is worried about losing her vision with all the MRI's she has to have.  Explained that radiation planning and treatments are different than an MRI.  She said she is going to talk to Dr. Mannie Stabile on Friday and will let us know if she is going to go forward with treatment.  Discussed canceled cystoscopy and ureteral stent procedure with Dr. Alvy Bimler.  She is not recommending chemotherapy due to concern for potential kidney damage with cisplatin without urologic intervention.

## 2020-04-05 ENCOUNTER — Ambulatory Visit (HOSPITAL_BASED_OUTPATIENT_CLINIC_OR_DEPARTMENT_OTHER): Admission: RE | Admit: 2020-04-05 | Payer: Medicare Other | Source: Home / Self Care | Admitting: Urology

## 2020-04-05 HISTORY — DX: Anemia, unspecified: D64.9

## 2020-04-05 HISTORY — DX: Essential (primary) hypertension: I10

## 2020-04-05 HISTORY — DX: Type 2 diabetes mellitus without complications: E11.9

## 2020-04-05 SURGERY — CYSTOSCOPY, WITH RETROGRADE PYELOGRAM AND URETERAL STENT INSERTION
Anesthesia: General

## 2020-04-10 ENCOUNTER — Ambulatory Visit: Payer: Medicare Other | Admitting: Radiation Oncology

## 2020-04-11 ENCOUNTER — Telehealth: Payer: Self-pay

## 2020-04-11 NOTE — Telephone Encounter (Signed)
Patient called as she had increased nausea yesterday and wanted a nausea med. She states she uses the [facility] on [address] and [address]. Patient states she is somewhat better but would be grateful for an rx. Advised patient Dr. Sondra Come will call in an rx for her. Patient verbalized understanding.

## 2020-04-12 ENCOUNTER — Ambulatory Visit: Payer: Medicare Other | Admitting: Radiation Oncology

## 2020-04-14 ENCOUNTER — Telehealth: Payer: Self-pay | Admitting: Hematology and Oncology

## 2020-04-14 NOTE — Telephone Encounter (Signed)
I reviewed the patient's history and her plan of care with the patient's primary care doctor She is noncompliant with follow-up, numerous no-shows to appointment or rescheduling of appointment I do not recommend chemotherapy in the absence of urologic procedure to correct her hydronephrosis She is scheduled to get radiation simulation next week and to start radiation therapy I will get GYN navigator to call the patient again for follow-up

## 2020-04-17 ENCOUNTER — Ambulatory Visit: Payer: Medicare Other | Admitting: Radiation Oncology

## 2020-04-17 ENCOUNTER — Telehealth: Payer: Self-pay | Admitting: Oncology

## 2020-04-17 NOTE — Telephone Encounter (Signed)
[name] and she said she thought her transportation did not show up.  She has contacted the transportation department and they are sending another ride.  Levada Schilling in [facility] [place] that patient will be late.

## 2020-04-17 NOTE — Telephone Encounter (Signed)
Latoya Green and she is planning on attending the CT sim appointment today.

## 2020-04-18 ENCOUNTER — Other Ambulatory Visit: Payer: Self-pay

## 2020-04-18 ENCOUNTER — Encounter: Payer: Self-pay | Admitting: Oncology

## 2020-04-18 ENCOUNTER — Ambulatory Visit
Admission: RE | Admit: 2020-04-18 | Discharge: 2020-04-18 | Disposition: A | Discharge status: Home / Self Care | Payer: Medicare Other | Source: Ambulatory Visit | Attending: Radiation Oncology | Admitting: Radiation Oncology

## 2020-04-18 DIAGNOSIS — C531 Malignant neoplasm of exocervix: Secondary | ICD-10-CM | POA: Diagnosis present

## 2020-04-18 NOTE — Progress Notes (Signed)
Met with [name] before her CT SIM appointment. Advised her to call with any questions or needs. 

## 2020-04-19 ENCOUNTER — Telehealth: Payer: Self-pay | Admitting: Oncology

## 2020-04-19 ENCOUNTER — Ambulatory Visit: Payer: Medicare Other | Admitting: Radiation Oncology

## 2020-04-19 NOTE — Telephone Encounter (Signed)
Latoya Green called and said Dr. Sondra Come was going to fill a morphine prescription for her but it is not at the pharmacy yet. She is wondering if it will be sent.

## 2020-04-20 ENCOUNTER — Ambulatory Visit: Payer: Medicare Other

## 2020-04-20 ENCOUNTER — Other Ambulatory Visit: Payer: Self-pay | Admitting: Radiation Oncology

## 2020-04-20 MED ORDER — MORPHINE SULFATE 15 MG PO TABS
15.0000 mg | ORAL_TABLET | ORAL | 0 refills | Status: AC | PRN
Start: 2020-04-20 — End: ?

## 2020-04-21 ENCOUNTER — Ambulatory Visit: Payer: Medicare Other

## 2020-04-24 ENCOUNTER — Ambulatory Visit: Payer: Medicare Other

## 2020-04-24 DIAGNOSIS — C531 Malignant neoplasm of exocervix: Secondary | ICD-10-CM | POA: Diagnosis not present

## 2020-04-25 ENCOUNTER — Ambulatory Visit: Payer: Medicare Other

## 2020-04-26 ENCOUNTER — Ambulatory Visit: Payer: Medicare Other | Admitting: Radiation Oncology

## 2020-04-27 ENCOUNTER — Ambulatory Visit: Payer: Medicare Other

## 2020-04-27 ENCOUNTER — Telehealth: Payer: Self-pay | Admitting: Oncology

## 2020-04-27 NOTE — Telephone Encounter (Signed)
Left messages x 2 regarding missed radiation appointments and to check on patient. Requested a return call.

## 2020-04-28 ENCOUNTER — Ambulatory Visit: Payer: Medicare Other

## 2020-05-01 ENCOUNTER — Ambulatory Visit: Payer: Medicare Other

## 2020-05-02 ENCOUNTER — Ambulatory Visit: Payer: Medicare Other | Admitting: Radiation Oncology

## 2020-05-02 NOTE — Telephone Encounter (Signed)
Called Latoya Green to check on her due to her missed radiation appointments.  She said that she is ok and asked what to expect, if it will be painful and if she will "get a needle."  Advised her that treatment is painless and she will not be stuck with a needle.  Discussed that we will go over side effects to expect at her appointment. Reviewed appointment time for tomorrow.  She said she will try to make it.

## 2020-05-03 ENCOUNTER — Ambulatory Visit: Payer: Medicare Other | Admitting: Radiation Oncology

## 2020-05-04 ENCOUNTER — Ambulatory Visit: Payer: Medicare Other | Admitting: Radiation Oncology

## 2020-05-05 ENCOUNTER — Ambulatory Visit: Payer: Medicare Other | Admitting: Radiation Oncology

## 2020-05-07 ENCOUNTER — Other Ambulatory Visit: Payer: Self-pay

## 2020-05-07 ENCOUNTER — Encounter (HOSPITAL_COMMUNITY): Payer: Self-pay | Admitting: Emergency Medicine

## 2020-05-07 ENCOUNTER — Inpatient Hospital Stay (HOSPITAL_COMMUNITY)
Admission: EM | Admit: 2020-05-07 | Discharge: 2020-05-13 | DRG: 755 | Disposition: A | Discharge status: Hospice | Payer: Medicare Other | Attending: Family Medicine | Admitting: Family Medicine

## 2020-05-07 DIAGNOSIS — C7911 Secondary malignant neoplasm of bladder: Secondary | ICD-10-CM | POA: Diagnosis present

## 2020-05-07 DIAGNOSIS — N1339 Other hydronephrosis: Secondary | ICD-10-CM | POA: Diagnosis present

## 2020-05-07 DIAGNOSIS — C531 Malignant neoplasm of exocervix: Secondary | ICD-10-CM | POA: Diagnosis present

## 2020-05-07 DIAGNOSIS — G893 Neoplasm related pain (acute) (chronic): Secondary | ICD-10-CM | POA: Diagnosis present

## 2020-05-07 DIAGNOSIS — I152 Hypertension secondary to endocrine disorders: Secondary | ICD-10-CM | POA: Diagnosis present

## 2020-05-07 DIAGNOSIS — G40909 Epilepsy, unspecified, not intractable, without status epilepticus: Secondary | ICD-10-CM | POA: Diagnosis present

## 2020-05-07 DIAGNOSIS — K5900 Constipation, unspecified: Secondary | ICD-10-CM | POA: Diagnosis not present

## 2020-05-07 DIAGNOSIS — Z881 Allergy status to other antibiotic agents status: Secondary | ICD-10-CM

## 2020-05-07 DIAGNOSIS — D509 Iron deficiency anemia, unspecified: Secondary | ICD-10-CM

## 2020-05-07 DIAGNOSIS — E1169 Type 2 diabetes mellitus with other specified complication: Secondary | ICD-10-CM | POA: Diagnosis present

## 2020-05-07 DIAGNOSIS — C539 Malignant neoplasm of cervix uteri, unspecified: Secondary | ICD-10-CM | POA: Diagnosis present

## 2020-05-07 DIAGNOSIS — Z806 Family history of leukemia: Secondary | ICD-10-CM

## 2020-05-07 DIAGNOSIS — Z20822 Contact with and (suspected) exposure to covid-19: Secondary | ICD-10-CM | POA: Diagnosis present

## 2020-05-07 DIAGNOSIS — D62 Acute posthemorrhagic anemia: Secondary | ICD-10-CM | POA: Diagnosis present

## 2020-05-07 DIAGNOSIS — Z515 Encounter for palliative care: Secondary | ICD-10-CM

## 2020-05-07 DIAGNOSIS — Z7984 Long term (current) use of oral hypoglycemic drugs: Secondary | ICD-10-CM

## 2020-05-07 DIAGNOSIS — E876 Hypokalemia: Secondary | ICD-10-CM | POA: Diagnosis present

## 2020-05-07 DIAGNOSIS — F419 Anxiety disorder, unspecified: Secondary | ICD-10-CM | POA: Diagnosis present

## 2020-05-07 DIAGNOSIS — Z66 Do not resuscitate: Secondary | ICD-10-CM | POA: Diagnosis present

## 2020-05-07 DIAGNOSIS — Z79899 Other long term (current) drug therapy: Secondary | ICD-10-CM

## 2020-05-07 DIAGNOSIS — N179 Acute kidney failure, unspecified: Secondary | ICD-10-CM | POA: Diagnosis present

## 2020-05-07 DIAGNOSIS — Z803 Family history of malignant neoplasm of breast: Secondary | ICD-10-CM

## 2020-05-07 DIAGNOSIS — D630 Anemia in neoplastic disease: Secondary | ICD-10-CM | POA: Diagnosis present

## 2020-05-07 DIAGNOSIS — F32A Depression, unspecified: Secondary | ICD-10-CM | POA: Diagnosis present

## 2020-05-07 DIAGNOSIS — F17210 Nicotine dependence, cigarettes, uncomplicated: Secondary | ICD-10-CM | POA: Diagnosis present

## 2020-05-07 DIAGNOSIS — F23 Brief psychotic disorder: Secondary | ICD-10-CM | POA: Diagnosis not present

## 2020-05-07 DIAGNOSIS — E119 Type 2 diabetes mellitus without complications: Secondary | ICD-10-CM

## 2020-05-07 DIAGNOSIS — E785 Hyperlipidemia, unspecified: Secondary | ICD-10-CM | POA: Diagnosis present

## 2020-05-07 DIAGNOSIS — Z880 Allergy status to penicillin: Secondary | ICD-10-CM

## 2020-05-07 DIAGNOSIS — Z7189 Other specified counseling: Secondary | ICD-10-CM

## 2020-05-07 LAB — CBC WITH DIFFERENTIAL/PLATELET
Abs Immature Granulocytes: 0.02 10*3/uL (ref 0.00–0.07)
Basophils Absolute: 0 10*3/uL (ref 0.0–0.1)
Basophils Relative: 0 %
Eosinophils Absolute: 0.1 10*3/uL (ref 0.0–0.5)
Eosinophils Relative: 1 %
HCT: 22 % — ABNORMAL LOW (ref 36.0–46.0)
Hemoglobin: 6.7 g/dL — CL (ref 12.0–15.0)
Immature Granulocytes: 0 %
Lymphocytes Relative: 12 %
Lymphs Abs: 0.9 10*3/uL (ref 0.7–4.0)
MCH: 22.5 pg — ABNORMAL LOW (ref 26.0–34.0)
MCHC: 30.5 g/dL (ref 30.0–36.0)
MCV: 73.8 fL — ABNORMAL LOW (ref 80.0–100.0)
Monocytes Absolute: 0.3 10*3/uL (ref 0.1–1.0)
Monocytes Relative: 4 %
Neutro Abs: 6.1 10*3/uL (ref 1.7–7.7)
Neutrophils Relative %: 83 %
Platelets: 357 10*3/uL (ref 150–400)
RBC: 2.98 MIL/uL — ABNORMAL LOW (ref 3.87–5.11)
RDW: 16.7 % — ABNORMAL HIGH (ref 11.5–15.5)
WBC: 7.3 10*3/uL (ref 4.0–10.5)
nRBC: 0 % (ref 0.0–0.2)

## 2020-05-07 NOTE — ED Notes (Signed)
Date and time results received: 05/07/20 2335  Test: Hgb Critical Value: 6.7  Name of Provider Notified: [place], EDP

## 2020-05-07 NOTE — ED Triage Notes (Signed)
Pt presents from home via [facility] for evaluation of vaginal bleeding that started last night. Pt was dx in October with cervical cancer.

## 2020-05-07 NOTE — ED Provider Notes (Signed)
[facility] DEPT Provider Note   CSN: [id number] Arrival date & time: 05/07/20  2032     History Chief Complaint  Patient presents with  . Vaginal Bleeding    Latoya Green is a 64 y.o. female.  Patient to ED with start of heavy vaginal bleeding today. Recent diagnosis of cervical cancer (02/2020). She has not started planned chemo or radiation and voices consideration she might choose not to pursue treatment. She reports she started having vaginal bleeding in January of this year and it has been intermittent since. It started again yesterday and has been "heavy" with clots. No new pain or increased pain over what she has been experiencing. She does report that urinating is uncomfortable. She reports symptoms of 'dry mouth', nausea that she attributes to what she understands as side effects of having recent imaging studies, MRI and CT. No fever, SOB, cough, abdominal pain or bowel movement changes.   The history is provided by the patient. No language interpreter was used.  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 Anemia   . Anxiety   . Cervical cancer ([place])   . Depression   . Diabetes mellitus without complication ([place])    ? type   . Epilepsy ([place])    1994  . Hypertension     Patient Active Problem List   Diagnosis Date Noted  . AKI (acute kidney injury) ([place]) 05/08/2020  . Hypokalemia 05/08/2020  . Acute on chronic blood loss anemia 05/08/2020  . Type 2 diabetes mellitus ([place]) 05/08/2020  . Hypertension associated with diabetes ([place]) 05/08/2020  . Hyperlipidemia associated with type 2 diabetes mellitus ([place]) 05/08/2020  . Cancer associated pain 03/01/2020  . Malignant neoplasm of exocervix ([place]) 02/23/2020  . Cervical mass 02/23/2020  . Tobacco  dependence 04/14/2015  . Suicidal ideation 10/25/2014  . Schizoaffective disorder, depressive type ([place]) 10/24/2014    Past Surgical History:  Procedure Laterality Date  . skin ca removed       OB History    Gravida  1   Para  1   Term      Preterm      AB      Living        SAB      TAB      Ectopic      Multiple      Live Births              Family History  Problem Relation Age of Onset  . Leukemia Father   . Cancer Sister        unsure type, thinks may be GYN  . Breast cancer Maternal Aunt   . Breast cancer Maternal Aunt   . Colon cancer Neg Hx     Social History   Tobacco Use  . Smoking status: Current Every Day Smoker    Packs/day: 1.00    Years: 35.00    Pack years: 35.00    Types: Cigarettes  . Smokeless tobacco: Never Used  Vaping Use  . Vaping Use: Never used  Substance Use Topics  . Alcohol use: No  . Drug use: No    Home Medications Prior to Admission medications   Medication Sig Start Date End Date Taking? Authorizing Provider  ACCU-CHEK GUIDE test  strip USE TO CHECK BLOOD SUGAR DAILY 02/13/20   [provider]  Accu-Chek Softclix Lancets lancets SMARTSIG:1 Topical Daily 12/27/19   [provider]  acetaminophen (TYLENOL) 500 MG tablet Take 500 mg by mouth every 6 (six) hours as needed.    [provider]  carbamazepine (TEGRETOL) 200 MG tablet Take 200 mg by mouth 2 (two) times daily. 02/25/18   [provider]  FEROSUL 325 (65 Fe) MG tablet Take 650 mg by mouth daily. 12/28/19   [provider]  hydrOXYzine (ATARAX/VISTARIL) 25 MG tablet Take 25 mg by mouth 2 (two) times daily as needed. 02/08/20   [provider]  ibuprofen (ADVIL) 100 MG tablet Take 100 mg by mouth every 6 (six) hours as needed for fever.    [provider]  ibuprofen (ADVIL) 200 MG tablet Take 200 mg by mouth every 6 (six) hours as needed.    [provider]  lisinopril (ZESTRIL) 2.5 MG  tablet Take 2.5 mg by mouth daily. 02/03/20   [provider]  Menthol (HALLS COUGH DROPS MT) Use as directed 1 lozenge in the mouth or throat as needed (for cough).    [provider]  metFORMIN (GLUCOPHAGE) 1000 MG tablet Take 1 tablet by mouth daily. 02/15/20   [provider]  morphine (MSIR) 15 MG tablet Take 1 tablet (15 mg total) by mouth every 4 (four) hours as needed for severe pain. 04/20/20   Gery Pray, MD  pravastatin (PRAVACHOL) 20 MG tablet Take 1 tablet by mouth daily. 02/15/20   [provider]  sertraline (ZOLOFT) 100 MG tablet Take 100 mg by mouth daily. 02/25/18   [provider]  traMADol (ULTRAM) 50 MG tablet Take 50 mg by mouth every 6 (six) hours as needed. 01/26/20   [provider]  Vitamin D, Ergocalciferol, (DRISDOL) 1.25 MG (50000 UNIT) CAPS capsule Take 50,000 Units by mouth once a week. 01/26/20   [provider]    Allergies    Keflex [cephalexin] and Penicillins  Review of Systems   Review of Systems  Constitutional: Positive for fatigue. Negative for chills and fever.  HENT: Negative.   Respiratory: Negative.  Negative for cough and shortness of breath.   Cardiovascular: Negative.  Negative for chest pain.  Gastrointestinal: Positive for nausea. Negative for abdominal pain, constipation, diarrhea and vomiting.  Genitourinary: Positive for dysuria and vaginal bleeding.  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  Skin: Negative.  Negative for color change.  Neurological: Negative.  Negative for weakness and headaches.  Psychiatric/Behavioral: Negative for confusion.    Physical Exam Updated Vital Signs BP 133/65 (BP Location: Right Arm)   Pulse 81   Temp 97.6 F (36.4 C) (Oral)   Resp 15   Ht 5\' 9"  (1.753 m)   Wt 77.1 kg   SpO2 95%   BMI 25.10 kg/m   Physical Exam Vitals and nursing note reviewed.  Constitutional:      General: She is  not in acute distress.    Appearance: Normal appearance. She is well-developed. She is not ill-appearing.  HENT:     Head: Normocephalic.  Eyes:     Conjunctiva/sclera: Conjunctivae normal.  Cardiovascular:     Rate and Rhythm: Normal rate.  Pulmonary:     Effort: Pulmonary effort is normal.  Abdominal:     General: Bowel sounds are normal. There is no distension.     Palpations: Abdomen is soft.  Tenderness: There is no abdominal tenderness. There is no guarding or rebound.  Genitourinary:    General: Normal vulva.     Comments: No active vaginal bleeding from external view. Musculoskeletal:        General: Normal range of motion.     Cervical back: Normal range of motion and neck supple.  Skin:    General: Skin is warm and dry.     Findings: No rash.  Neurological:     Mental Status: She is alert.     Cranial Nerves: No cranial nerve deficit.     ED Results / Procedures / Treatments   Labs (all labs ordered are listed, but only abnormal results are displayed) Labs Reviewed  CBC WITH DIFFERENTIAL/PLATELET - Abnormal; Notable for the following components:      Result Value   RBC 2.98 (*)    Hemoglobin 6.7 (*)    HCT 22.0 (*)    MCV 73.8 (*)    MCH 22.5 (*)    RDW 16.7 (*)    All other components within normal limits  BASIC METABOLIC PANEL - Abnormal; Notable for the following components:   Potassium 2.4 (*)    Chloride 97 (*)    Glucose, Bld 135 (*)    Creatinine, Ser 2.80 (*)    Calcium 8.7 (*)    GFR, Estimated 19 (*)    All other components within normal limits  URINE CULTURE  RESP PANEL BY RT-PCR (FLU A&B, COVID) ARPGX2  PROTIME-INR  APTT  URINALYSIS, ROUTINE W REFLEX MICROSCOPIC  MAGNESIUM  VITAMIN B12  FOLATE  IRON AND TIBC  FERRITIN  TYPE AND SCREEN  PREPARE RBC (CROSSMATCH)  ABO/RH   Results for orders placed or performed during the hospital encounter of 05/07/20  CBC with Differential  Result Value Ref Range   WBC 7.3 4.0 - 10.5 K/uL    RBC 2.98 (L) 3.87 - 5.11 MIL/uL   Hemoglobin 6.7 (LL) 12.0 - 15.0 g/dL   HCT 22.0 (L) 36 - 46 %   MCV 73.8 (L) 80.0 - 100.0 fL   MCH 22.5 (L) 26.0 - 34.0 pg   MCHC 30.5 30.0 - 36.0 g/dL   RDW 16.7 (H) 11.5 - 15.5 %   Platelets 357 150 - 400 K/uL   nRBC 0.0 0.0 - 0.2 %   Neutrophils Relative % 83 %   Neutro Abs 6.1 1.7 - 7.7 K/uL   Lymphocytes Relative 12 %   Lymphs Abs 0.9 0.7 - 4.0 K/uL   Monocytes Relative 4 %   Monocytes Absolute 0.3 0.1 - 1.0 K/uL   Eosinophils Relative 1 %   Eosinophils Absolute 0.1 0.0 - 0.5 K/uL   Basophils Relative 0 %   Basophils Absolute 0.0 0.0 - 0.1 K/uL   Immature Granulocytes 0 %   Abs Immature Granulocytes 0.02 0.00 - 0.07 K/uL  Basic metabolic panel  Result Value Ref Range   Sodium 143 135 - 145 mmol/L   Potassium 2.4 (LL) 3.5 - 5.1 mmol/L   Chloride 97 (L) 98 - 111 mmol/L   CO2 31 22 - 32 mmol/L   Glucose, Bld 135 (H) 70 - 99 mg/dL   BUN 20 8 - 23 mg/dL   Creatinine, Ser 2.80 (H) 0.44 - 1.00 mg/dL   Calcium 8.7 (L) 8.9 - 10.3 mg/dL   GFR, Estimated 19 (L) >60 mL/min   Anion gap 15 5 - 15  Protime-INR  Result Value Ref Range   Prothrombin Time 14.5 11.4 -  15.2 seconds   INR 1.2 0.8 - 1.2  APTT  Result Value Ref Range   aPTT 31 24 - 36 seconds    EKG None  Radiology No results found.  Procedures Procedures (including critical care time) CRITICAL CARE Performed by: Dewaine Oats   Total critical care time: 45 minutes  Critical care time was exclusive of separately billable procedures and treating other patients.  Critical care was necessary to treat or prevent imminent or life-threatening deterioration.  Critical care was time spent personally by me on the following activities: development of treatment plan with patient and/or surrogate as well as nursing, discussions with consultants, evaluation of patient's response to treatment, examination of patient, obtaining history from patient or surrogate, ordering and performing  treatments and interventions, ordering and review of laboratory studies, ordering and review of radiographic studies, pulse oximetry and re-evaluation of patient's condition.  Medications Ordered in ED Medications  0.9 %  sodium chloride infusion (Manually program via Guardrails IV Fluids) (has no administration in time range)  potassium chloride SA (KLOR-CON) CR tablet 40 mEq (has no administration in time range)  potassium chloride 10 mEq in 100 mL IVPB (has no administration in time range)    ED Course  I have reviewed the triage vital signs and the nursing notes.  Pertinent labs & imaging results that were available during my care of the patient were reviewed by me and considered in my medical decision making (see chart for details).    MDM Rules/Calculators/A&P                          Patient to ED having vaginal bleeding, intermittent x 11 months. Also having dysuria x 2 days.   Patient has a recent diagnosis of cervical cancer. Per chart review, she has missed all radiation appointments and many f/u oncology appts. She expresses reservations regarding her recommended cancer treatments.   She also feels she may not be able to keep living at home where she lives alone. She is unsure of diagnosis, "how much time I have left" and being able to care for herself. She approaches the idea of long term living facility. Discussed that this will need to be a discussion she needs to have with her doctor.   Hgb critical value called of 6.7. She is not hemodynamically unstable - no hypotension, tachycardia, lightheadedness, syncope. Consider transfusing and discharge home. This was discussed with her and she is comfortable with plan.   Further lab abnormalities evident with K+ 2.4, AKI with Cr 2.80, normal one month ago. Potassium ordered. Urine collected and is grossly bloody. Pending result.   Discussed admission with Dr. Posey Pronto, [facility], who accepts the patient onto his service. Patient updated  on plan and is agreeable to staying in hospital.     Final Clinical Impression(s) / ED Diagnoses Final diagnoses:  None   1. Anemia requiring transfusion 2. Hypokalemia 3. AKI  Rx / DC Orders ED Discharge Orders    None       Dennie Bible 05/08/20 0113    Quintella Reichert, MD 05/08/20 1459

## 2020-05-08 ENCOUNTER — Ambulatory Visit: Payer: Medicare Other

## 2020-05-08 ENCOUNTER — Other Ambulatory Visit: Payer: Self-pay

## 2020-05-08 ENCOUNTER — Observation Stay (HOSPITAL_COMMUNITY): Payer: Medicare Other

## 2020-05-08 DIAGNOSIS — E1169 Type 2 diabetes mellitus with other specified complication: Secondary | ICD-10-CM | POA: Diagnosis present

## 2020-05-08 DIAGNOSIS — E876 Hypokalemia: Secondary | ICD-10-CM | POA: Diagnosis present

## 2020-05-08 DIAGNOSIS — Z66 Do not resuscitate: Secondary | ICD-10-CM | POA: Diagnosis present

## 2020-05-08 DIAGNOSIS — Z803 Family history of malignant neoplasm of breast: Secondary | ICD-10-CM | POA: Diagnosis not present

## 2020-05-08 DIAGNOSIS — F17210 Nicotine dependence, cigarettes, uncomplicated: Secondary | ICD-10-CM | POA: Diagnosis present

## 2020-05-08 DIAGNOSIS — Z79899 Other long term (current) drug therapy: Secondary | ICD-10-CM | POA: Diagnosis not present

## 2020-05-08 DIAGNOSIS — G893 Neoplasm related pain (acute) (chronic): Secondary | ICD-10-CM | POA: Diagnosis present

## 2020-05-08 DIAGNOSIS — C7911 Secondary malignant neoplasm of bladder: Secondary | ICD-10-CM | POA: Diagnosis present

## 2020-05-08 DIAGNOSIS — F23 Brief psychotic disorder: Secondary | ICD-10-CM | POA: Diagnosis not present

## 2020-05-08 DIAGNOSIS — N179 Acute kidney failure, unspecified: Secondary | ICD-10-CM | POA: Diagnosis present

## 2020-05-08 DIAGNOSIS — F32A Depression, unspecified: Secondary | ICD-10-CM | POA: Diagnosis present

## 2020-05-08 DIAGNOSIS — Z7189 Other specified counseling: Secondary | ICD-10-CM

## 2020-05-08 DIAGNOSIS — N1339 Other hydronephrosis: Secondary | ICD-10-CM | POA: Diagnosis present

## 2020-05-08 DIAGNOSIS — G40909 Epilepsy, unspecified, not intractable, without status epilepticus: Secondary | ICD-10-CM | POA: Diagnosis present

## 2020-05-08 DIAGNOSIS — I152 Hypertension secondary to endocrine disorders: Secondary | ICD-10-CM | POA: Diagnosis present

## 2020-05-08 DIAGNOSIS — Z20822 Contact with and (suspected) exposure to covid-19: Secondary | ICD-10-CM | POA: Diagnosis present

## 2020-05-08 DIAGNOSIS — Z881 Allergy status to other antibiotic agents status: Secondary | ICD-10-CM | POA: Diagnosis not present

## 2020-05-08 DIAGNOSIS — Z515 Encounter for palliative care: Secondary | ICD-10-CM | POA: Diagnosis not present

## 2020-05-08 DIAGNOSIS — D630 Anemia in neoplastic disease: Secondary | ICD-10-CM | POA: Diagnosis present

## 2020-05-08 DIAGNOSIS — Z7984 Long term (current) use of oral hypoglycemic drugs: Secondary | ICD-10-CM | POA: Diagnosis not present

## 2020-05-08 DIAGNOSIS — E119 Type 2 diabetes mellitus without complications: Secondary | ICD-10-CM

## 2020-05-08 DIAGNOSIS — C539 Malignant neoplasm of cervix uteri, unspecified: Secondary | ICD-10-CM | POA: Diagnosis present

## 2020-05-08 DIAGNOSIS — D62 Acute posthemorrhagic anemia: Secondary | ICD-10-CM | POA: Diagnosis present

## 2020-05-08 DIAGNOSIS — F419 Anxiety disorder, unspecified: Secondary | ICD-10-CM | POA: Diagnosis present

## 2020-05-08 DIAGNOSIS — K5900 Constipation, unspecified: Secondary | ICD-10-CM | POA: Diagnosis not present

## 2020-05-08 DIAGNOSIS — E785 Hyperlipidemia, unspecified: Secondary | ICD-10-CM | POA: Diagnosis present

## 2020-05-08 DIAGNOSIS — C531 Malignant neoplasm of exocervix: Secondary | ICD-10-CM | POA: Diagnosis present

## 2020-05-08 LAB — CBC
HCT: 24.3 % — ABNORMAL LOW (ref 36.0–46.0)
Hemoglobin: 7.5 g/dL — ABNORMAL LOW (ref 12.0–15.0)
MCH: 24 pg — ABNORMAL LOW (ref 26.0–34.0)
MCHC: 30.9 g/dL (ref 30.0–36.0)
MCV: 77.9 fL — ABNORMAL LOW (ref 80.0–100.0)
Platelets: 341 10*3/uL (ref 150–400)
RBC: 3.12 MIL/uL — ABNORMAL LOW (ref 3.87–5.11)
RDW: 17.5 % — ABNORMAL HIGH (ref 11.5–15.5)
WBC: 10.9 10*3/uL — ABNORMAL HIGH (ref 4.0–10.5)
nRBC: 0 % (ref 0.0–0.2)

## 2020-05-08 LAB — IRON AND TIBC
Iron: 271 ug/dL — ABNORMAL HIGH (ref 28–170)
Saturation Ratios: 74 % — ABNORMAL HIGH (ref 10.4–31.8)
TIBC: 364 ug/dL (ref 250–450)
UIBC: 93 ug/dL

## 2020-05-08 LAB — URINALYSIS, MICROSCOPIC (REFLEX): RBC / HPF: >50 RBC/hpf (ref 0–5)

## 2020-05-08 LAB — BASIC METABOLIC PANEL
Anion gap: 15 (ref 5–15)
Anion gap: 12 (ref 5–15)
BUN: 20 mg/dL (ref 8–23)
BUN: 19 mg/dL (ref 8–23)
CO2: 31 mmol/L (ref 22–32)
CO2: 27 mmol/L (ref 22–32)
Calcium: 8.7 mg/dL — ABNORMAL LOW (ref 8.9–10.3)
Calcium: 8.4 mg/dL — ABNORMAL LOW (ref 8.9–10.3)
Chloride: 97 mmol/L — ABNORMAL LOW (ref 98–111)
Chloride: 100 mmol/L (ref 98–111)
Creatinine, Ser: 2.8 mg/dL — ABNORMAL HIGH (ref 0.44–1.00)
Creatinine, Ser: 2.94 mg/dL — ABNORMAL HIGH (ref 0.44–1.00)
GFR, Estimated: 19 mL/min — ABNORMAL LOW (ref >60)
GFR, Estimated: 17 mL/min — ABNORMAL LOW (ref >60)
Glucose, Bld: 135 mg/dL — ABNORMAL HIGH (ref 70–99)
Glucose, Bld: 113 mg/dL — ABNORMAL HIGH (ref 70–99)
Potassium: 2.4 mmol/L — CL (ref 3.5–5.1)
Potassium: 2.8 mmol/L — ABNORMAL LOW (ref 3.5–5.1)
Sodium: 143 mmol/L (ref 135–145)
Sodium: 139 mmol/L (ref 135–145)

## 2020-05-08 LAB — GLUCOSE, CAPILLARY
Glucose-Capillary: 140 mg/dL — ABNORMAL HIGH (ref 70–99)
Glucose-Capillary: 118 mg/dL — ABNORMAL HIGH (ref 70–99)
Glucose-Capillary: 120 mg/dL — ABNORMAL HIGH (ref 70–99)
Glucose-Capillary: 108 mg/dL — ABNORMAL HIGH (ref 70–99)

## 2020-05-08 LAB — URINE CULTURE

## 2020-05-08 LAB — URINALYSIS, ROUTINE W REFLEX MICROSCOPIC

## 2020-05-08 LAB — RESP PANEL BY RT-PCR (FLU A&B, COVID) ARPGX2
Influenza A by PCR: NEGATIVE
Influenza B by PCR: NEGATIVE
SARS Coronavirus 2 by RT PCR: NEGATIVE

## 2020-05-08 LAB — FOLATE: Folate: 6.6 ng/mL (ref >5.9)

## 2020-05-08 LAB — PROTIME-INR
INR: 1.2 (ref 0.8–1.2)
Prothrombin Time: 14.5 seconds (ref 11.4–15.2)

## 2020-05-08 LAB — PREPARE RBC (CROSSMATCH)

## 2020-05-08 LAB — APTT: aPTT: 31 seconds (ref 24–36)

## 2020-05-08 LAB — VITAMIN B12: Vitamin B-12: 1172 pg/mL — ABNORMAL HIGH (ref 180–914)

## 2020-05-08 LAB — ABO/RH: ABO/RH(D): A POS

## 2020-05-08 LAB — MAGNESIUM: Magnesium: 2.1 mg/dL (ref 1.7–2.4)

## 2020-05-08 LAB — FERRITIN: Ferritin: 13 ng/mL (ref 11–307)

## 2020-05-08 MED ORDER — CARBAMAZEPINE 200 MG PO TABS
200.0000 mg | ORAL_TABLET | Freq: Every day | ORAL | Status: DC
  Administered 2020-05-08 – 2020-05-13 (×6): 200 mg via ORAL
  Filled 2020-05-08 (×6): qty 1

## 2020-05-08 MED ORDER — SERTRALINE HCL 100 MG PO TABS
100.0000 mg | ORAL_TABLET | Freq: Every day | ORAL | Status: DC
  Administered 2020-05-08 – 2020-05-13 (×6): 100 mg via ORAL
  Filled 2020-05-08 (×6): qty 1

## 2020-05-08 MED ORDER — POTASSIUM CHLORIDE 20 MEQ/15ML (10%) PO SOLN
60.0000 meq | Freq: Once | ORAL | Status: AC
  Administered 2020-05-08: 60 meq via ORAL
  Filled 2020-05-08: qty 45

## 2020-05-08 MED ORDER — POTASSIUM CHLORIDE 10 MEQ/100ML IV SOLN
10.0000 meq | INTRAVENOUS | Status: AC
  Administered 2020-05-08 (×3): 10 meq via INTRAVENOUS
  Filled 2020-05-08 (×3): qty 100

## 2020-05-08 MED ORDER — INSULIN ASPART 100 UNIT/ML ~~LOC~~ SOLN
0.0000 [IU] | Freq: Three times a day (TID) | SUBCUTANEOUS | Status: DC
  Filled 2020-05-08: qty 0.09

## 2020-05-08 MED ORDER — POTASSIUM CHLORIDE 10 MEQ/100ML IV SOLN
10.0000 meq | INTRAVENOUS | Status: DC
  Filled 2020-05-08: qty 100

## 2020-05-08 MED ORDER — HYDROXYZINE HCL 25 MG PO TABS
25.0000 mg | ORAL_TABLET | Freq: Two times a day (BID) | ORAL | Status: DC | PRN
  Administered 2020-05-08 – 2020-05-10 (×2): 25 mg via ORAL
  Filled 2020-05-08 (×3): qty 1

## 2020-05-08 MED ORDER — ACETAMINOPHEN 650 MG RE SUPP: 650.0000 mg | Freq: Four times a day (QID) | RECTAL | Status: DC | PRN

## 2020-05-08 MED ORDER — SODIUM CHLORIDE 0.45 % IV SOLN: INTRAVENOUS | Status: DC

## 2020-05-08 MED ORDER — PRAVASTATIN SODIUM 20 MG PO TABS
20.0000 mg | ORAL_TABLET | Freq: Every day | ORAL | Status: DC
  Administered 2020-05-08 – 2020-05-09 (×2): 20 mg via ORAL
  Filled 2020-05-08 (×2): qty 1

## 2020-05-08 MED ORDER — ONDANSETRON HCL 4 MG PO TABS
4.0000 mg | ORAL_TABLET | Freq: Four times a day (QID) | ORAL | Status: DC | PRN
  Administered 2020-05-08: 4 mg via ORAL
  Filled 2020-05-08: qty 1

## 2020-05-08 MED ORDER — SODIUM CHLORIDE 0.9% IV SOLUTION: Freq: Once | INTRAVENOUS | Status: DC

## 2020-05-08 MED ORDER — ONDANSETRON HCL 4 MG/2ML IJ SOLN
4.0000 mg | Freq: Four times a day (QID) | INTRAMUSCULAR | Status: DC | PRN
  Administered 2020-05-08: 4 mg via INTRAVENOUS
  Filled 2020-05-08: qty 2

## 2020-05-08 MED ORDER — POTASSIUM CHLORIDE CRYS ER 20 MEQ PO TBCR
40.0000 meq | EXTENDED_RELEASE_TABLET | Freq: Once | ORAL | Status: DC
  Filled 2020-05-08: qty 2

## 2020-05-08 MED ORDER — ACETAMINOPHEN 325 MG PO TABS: 650.0000 mg | ORAL_TABLET | Freq: Four times a day (QID) | ORAL | Status: DC | PRN

## 2020-05-08 MED ORDER — FERROUS SULFATE 325 (65 FE) MG PO TABS
650.0000 mg | ORAL_TABLET | Freq: Every day | ORAL | Status: DC
  Administered 2020-05-08 – 2020-05-09 (×2): 650 mg via ORAL
  Filled 2020-05-08 (×2): qty 2

## 2020-05-08 MED ORDER — MORPHINE SULFATE 15 MG PO TABS
15.0000 mg | ORAL_TABLET | ORAL | Status: DC | PRN
  Administered 2020-05-09: 15 mg via ORAL
  Filled 2020-05-08 (×2): qty 1

## 2020-05-08 NOTE — H&P (Signed)
History and Physical    Latoya Green PQZ:[id number] DOB: 1955/09/16 DOA: 05/07/2020  PCP: Caren Macadam, MD  Patient coming from: Home via EMS  I have personally briefly reviewed patient's old medical records in [place]  Chief Complaint: Vaginal bleeding  HPI: Latoya Green is a 64 y.o. 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  Patient has been having ongoing intermittent vaginal bleeding and hematuria secondary to her invasive cervical cancer.  She has had recent nausea with frequent dry heaves.  She has felt generally weak.  She denies any subjective fevers, chills, diaphoresis, chest pain, dyspnea, abdominal pain, or dysuria.  Patient has been seen by oncology, Dr. Alvy Bimler, and radiation/oncology Dr. Sondra Come.  She was planned to start chemotherapy and radiation however MRI pelvis 03/21/2020 showed that the uterine cervix mass was invading the bladder as well as bilateral hydroureteronephrosis.  She was seen by urology, Dr. Tresa Moore, with plan for cystoscopy with retrograde pyelogram/ureteral stent placement before any further therapy could be initiated however patient did not keep her procedure appointment.  Patient tells me that she does not want to pursue any chemotherapy, radiation, or invasive procedures going forward.  ED Course:  Initial vitals showed BP 104/71, pulse 105, RR 18, temp 97.7 Fahrenheit, SPO2 97% on room air.  Hemoglobin 6.7 (9.3 on 03/22/2020), WBC 7.3, platelets 357,000, potassium 2.4, sodium 143, bicarb 31, BUN 20, creatinine 2.80 (0.97 on 03/22/2020), serum glucose 135.  Patient was ordered to receive 40 mEq of oral potassium, 10 mEq IV potassium x2, and 2 units PRBC transfusion.  Hospitalist service was consulted to admit for further evaluation and management.  Review of Systems:  All systems reviewed and are negative except as documented in history of present illness above.   Past Medical History:  Diagnosis Date  . Anemia   . Anxiety   . Cervical cancer ([place])   . Depression   . Diabetes mellitus without complication ([place])    ? type   . Epilepsy ([place])    1994  . Hypertension     Past Surgical History:  Procedure Laterality Date  . skin ca removed      Social History:  reports that she has been smoking cigarettes. She has a 35.00 pack-year smoking history. She has never used smokeless tobacco. She reports that she does not drink alcohol and does not use drugs.  Allergies  Allergen Reactions  . Keflex [Cephalexin] Swelling    Pain in her throught.  . Penicillins Other (See Comments)    Childhood allergy - has never actually taken medication  Has patient had a PCN reaction causing immediate rash, facial/tongue/throat swelling, SOB or lightheadedness with hypotension: Unknown Has patient had a PCN reaction causing severe rash involving mucus membranes or skin necrosis: Unknown Has patient had a PCN reaction that required hospitalization: No Has patient had a PCN reaction occurring within the last 10 years: No If all of the above answers are "NO", then may proceed with Cephalosporin    Family History  Problem Relation Age of Onset  . Leukemia Father   . Cancer Sister        unsure type, thinks may be GYN  . Breast cancer Maternal Aunt   . Breast cancer Maternal Aunt   . Colon cancer Neg Hx      Prior to Admission medications   Medication  Sig Start Date End Date Taking? Authorizing Provider  ACCU-CHEK GUIDE test strip USE TO CHECK BLOOD SUGAR DAILY 02/13/20   [provider]  Accu-Chek Softclix Lancets lancets SMARTSIG:1 Topical Daily 12/27/19   [provider]  acetaminophen (TYLENOL) 500 MG tablet Take 500 mg by mouth every 6 (six) hours as needed.    [provider]  carbamazepine (TEGRETOL) 200 MG tablet Take 200 mg  by mouth 2 (two) times daily. 02/25/18   [provider]  FEROSUL 325 (65 Fe) MG tablet Take 650 mg by mouth daily. 12/28/19   [provider]  hydrOXYzine (ATARAX/VISTARIL) 25 MG tablet Take 25 mg by mouth 2 (two) times daily as needed. 02/08/20   [provider]  ibuprofen (ADVIL) 100 MG tablet Take 100 mg by mouth every 6 (six) hours as needed for fever.    [provider]  ibuprofen (ADVIL) 200 MG tablet Take 200 mg by mouth every 6 (six) hours as needed.    [provider]  lisinopril (ZESTRIL) 2.5 MG tablet Take 2.5 mg by mouth daily. 02/03/20   [provider]  Menthol (HALLS COUGH DROPS MT) Use as directed 1 lozenge in the mouth or throat as needed (for cough).    [provider]  metFORMIN (GLUCOPHAGE) 1000 MG tablet Take 1 tablet by mouth daily. 02/15/20   [provider]  morphine (MSIR) 15 MG tablet Take 1 tablet (15 mg total) by mouth every 4 (four) hours as needed for severe pain. 04/20/20   Gery Pray, MD  pravastatin (PRAVACHOL) 20 MG tablet Take 1 tablet by mouth daily. 02/15/20   [provider]  sertraline (ZOLOFT) 100 MG tablet Take 100 mg by mouth daily. 02/25/18   [provider]  traMADol (ULTRAM) 50 MG tablet Take 50 mg by mouth every 6 (six) hours as needed. 01/26/20   [provider]  Vitamin D, Ergocalciferol, (DRISDOL) 1.25 MG (50000 UNIT) CAPS capsule Take 50,000 Units by mouth once a week. 01/26/20   [provider]    Physical Exam: Vitals:   05/07/20 2300 05/07/20 2330 05/08/20 0030 05/08/20 0036  BP: 113/80 123/81 133/65 133/65  Pulse: 84 80 87 81  Resp: 16 16 16 15   Temp:    97.6 F (36.4 C)  TempSrc:    Oral  SpO2: 99% 97% 98% 95%  Weight:      Height:       Constitutional: Resting supine in bed, NAD, calm, comfortable Eyes: PERRL, lids and conjunctivae normal ENMT: Mucous membranes are moist. Posterior pharynx clear of any exudate or lesions.Normal  dentition.  Neck: normal, supple, no masses. Respiratory: clear to auscultation bilaterally, no wheezing, no crackles. Normal respiratory effort. No accessory muscle use.  Cardiovascular: Regular rate and rhythm, no murmurs / rubs / gallops. No extremity edema. 2+ pedal pulses. Abdomen: Suprapubic tenderness. No hepatosplenomegaly. Bowel sounds positive.  Musculoskeletal: no clubbing / cyanosis. No joint deformity upper and lower extremities. Good ROM, no contractures. Normal muscle tone.  Skin: Pale complexion, no rashes, lesions, ulcers. No induration Neurologic: CN 2-12 grossly intact. Sensation intact, Strength 5/5 in all 4.  Psychiatric: Alert and oriented x 3. Normal mood.   Labs on Admission: I have personally reviewed following labs and imaging studies  CBC: Recent Labs  Lab 05/07/20 2231  WBC 7.3  NEUTROABS 6.1  HGB 6.7*  HCT 22.0*  MCV 73.8*  PLT 400   Basic Metabolic Panel: Recent Labs  Lab 05/07/20 2231  NA 143  K 2.4*  CL 97*  CO2 31  GLUCOSE 135*  BUN 20  CREATININE 2.80*  CALCIUM 8.7*   GFR: Estimated Creatinine Clearance: 21.8 mL/min (A) (by C-G formula based on SCr of 2.8 mg/dL (H)). Liver Function Tests: No results for input(s): AST, ALT, ALKPHOS, BILITOT, PROT, ALBUMIN in the last 168 hours. No results for input(s): LIPASE, AMYLASE in the last 168 hours. No results for input(s): AMMONIA in the last 168 hours. Coagulation Profile: Recent Labs  Lab 05/08/20 0002  INR 1.2   Cardiac Enzymes: No results for input(s): CKTOTAL, CKMB, CKMBINDEX, TROPONINI in the last 168 hours. BNP (last 3 results) No results for input(s): PROBNP in the last 8760 hours. HbA1C: No results for input(s): HGBA1C in the last 72 hours. CBG: No results for input(s): GLUCAP in the last 168 hours. Lipid Profile: No results for input(s): CHOL, HDL, LDLCALC, TRIG, CHOLHDL, LDLDIRECT in the last 72 hours. Thyroid Function Tests: No results for input(s): TSH, T4TOTAL, FREET4,  T3FREE, THYROIDAB in the last 72 hours. Anemia Panel: No results for input(s): VITAMINB12, FOLATE, FERRITIN, TIBC, IRON, RETICCTPCT in the last 72 hours. Urine analysis:    Component Value Date/Time   COLORURINE RED (A) 05/08/2020 0002   APPEARANCEUR TURBID (A) 05/08/2020 0002   LABSPEC  05/08/2020 0002    TEST NOT REPORTED DUE TO COLOR INTERFERENCE OF URINE PIGMENT   PHURINE  05/08/2020 0002    TEST NOT REPORTED DUE TO COLOR INTERFERENCE OF URINE PIGMENT   GLUCOSEU (A) 05/08/2020 0002    TEST NOT REPORTED DUE TO COLOR INTERFERENCE OF URINE PIGMENT   HGBUR (A) 05/08/2020 0002    TEST NOT REPORTED DUE TO COLOR INTERFERENCE OF URINE PIGMENT   BILIRUBINUR (A) 05/08/2020 0002    TEST NOT REPORTED DUE TO COLOR INTERFERENCE OF URINE PIGMENT   KETONESUR (A) 05/08/2020 0002    TEST NOT REPORTED DUE TO COLOR INTERFERENCE OF URINE PIGMENT   PROTEINUR (A) 05/08/2020 0002    TEST NOT REPORTED DUE TO COLOR INTERFERENCE OF URINE PIGMENT   UROBILINOGEN 0.2 12/06/2013 0207   NITRITE (A) 05/08/2020 0002    TEST NOT REPORTED DUE TO COLOR INTERFERENCE OF URINE PIGMENT   LEUKOCYTESUR (A) 05/08/2020 0002    TEST NOT REPORTED DUE TO COLOR INTERFERENCE OF URINE PIGMENT    Radiological Exams on Admission: No results found.  EKG: Personally reviewed. Sinus rhythm, RAE, QTC 544.  QTC prolongation new when compared to prior.  Assessment/Plan Principal Problem:   AKI (acute kidney injury) ([place]) Active Problems:   Malignant neoplasm of exocervix ([place])   Cancer associated pain   Hypokalemia   Acute on chronic blood loss anemia   Type 2 diabetes mellitus ([place])   Hypertension associated with diabetes ([place])   Hyperlipidemia associated with type 2 diabetes mellitus ([place])   Goals of care, counseling/discussion  Latoya Green is a 64 y.o. 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  Acute kidney injury: Creatinine 2.80 on admission compared to 0.97 on 03/22/2020.  Most likely related to her invasive cervical cancer involving the bladder and known bilateral hydroureteronephrosis.  Had planned cystoscopy with potential stent placement by urology, Dr. Tresa Moore, however patient did not keep appointment.  Will check renal ultrasound to assess progression however patient tells me she would not want to pursue any further procedures.  Patient  is agreeable to speak with palliative care as renal function will most likely continue to worsen without further urological management. -Recommend palliative care consultation -Hold home lisinopril, ibuprofen, and Metformin  Hypokalemia: Replete orally and with IV supplement.  Check magnesium and replete as needed.  Acute on chronic blood loss anemia: Secondary to bleeding from invasive cervical cancer.  She agrees to blood transfusion. 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  Seen in consultation as an outpatient by oncology, Dr. Alvy Bimler, radiation/oncology Dr. Sondra Come, and urology, Dr. Tresa Moore.  Patient tells me she would not want to undergo any chemotherapy, radiation, or invasive procedures.  Her main concern is focusing on her comfort and quality of life in the time she has left.  She has agreed to palliative care consultation for further assistance.  CODE STATUS is confirmed to be DNR. -Recommend palliative care consultation -Continue pain control with morphine 15 mg po q4h as needed -Antiemetics as needed  Type 2 diabetes: Hold Metformin.  Placed on sensitive  SSI.  Hypertension: Currently stable.  Holding lisinopril.  Hyperlipidemia: Continue pravastatin.  Depression/anxiety: Continue sertraline.  DVT prophylaxis: SCDs Code Status: DNR, confirmed with patient Family Communication: Discussed with patient, she has discussed with family Disposition Plan: From home and likely discharge to home Consults called: None Admission status:  Status is: Observation  The patient remains OBS appropriate and will d/c before 2 midnights.  Dispo: The patient is from: Home              Anticipated d/c is to: Home              Anticipated d/c date is: 1 day              Patient currently is not medically stable to d/c.  Zada Finders MD [facility]  If 7PM-7AM, please contact night-coverage [email or web address]  05/08/2020, 1:36 AM

## 2020-05-08 NOTE — ED Notes (Signed)
ED TO INPATIENT HANDOFF REPORT  Name/Age/Gender Latoya Green 64 y.o. female  Code Status    Code Status Orders  (From admission, onward)         Start     Ordered   05/08/20 0125  Do not attempt resuscitation (DNR)  Continuous       Question Answer Comment  In the event of cardiac or respiratory ARREST Do not call a "code blue"   In the event of cardiac or respiratory ARREST Do not perform Intubation, CPR, defibrillation or ACLS   In the event of cardiac or respiratory ARREST Use medication by any route, position, wound care, and other measures to relive pain and suffering. May use oxygen, suction and manual treatment of airway obstruction as needed for comfort.      05/08/20 0126        Code Status History    Date Active Date Inactive Code Status Order ID Comments User Context   10/25/2014 0028 10/26/2014 1533 Full Code [id number]  Linton Flemings, MD ED   10/15/2014 0330 10/24/2014 1710 Full Code [id number]  Harriet Butte, NP Inpatient   10/15/2014 0021 10/15/2014 0330 Full Code [id number]  Muthersbaugh, Gwenlyn Perking ED   04/05/2014 2029 04/11/2014 1809 Full Code [id number]  Laverle Hobby, PA-C Inpatient   04/05/2014 1357 04/05/2014 2029 Full Code [id number]  Charlesetta Shanks, MD ED   02/28/2014 2257 03/07/2014 1709 Full Code [id number]  Waylan Boga, NP Inpatient   02/27/2014 1557 02/28/2014 2257 Full Code [id number]  Harlow Mares, PA-C ED   12/29/2013 1727 01/04/2014 1738 Full Code [id number]  Earleen Newport, NP Inpatient   12/28/2013 1641 12/29/2013 1727 Full Code [id number]  Francine Graven, DO ED   12/04/2013 1425 12/06/2013 1355 Full Code [id number]  Jena Gauss ED   Advance Care Planning Activity      Home/SNF/Other Home  Chief Complaint AKI (acute kidney injury) ([place]) [N17.9]  Level of Care/Admitting Diagnosis ED Disposition    ED Disposition Condition [place]: [facility] [[id number]]  Level of Care: Telemetry [5]   Admit to tele based on following criteria: Other see comments  Comments: Hypokalemia  Covid Evaluation: Asymptomatic Screening Protocol (No Symptoms)  Diagnosis: AKI (acute kidney injury) [facility]) [[id number]]  Admitting Physician: Lenore Cordia [[id number]]  Attending Physician: Lenore Cordia [[id number]]       Medical History Past Medical History:  Diagnosis Date  . Anemia   . Anxiety   . Cervical cancer ([place])   . Depression   . Diabetes mellitus without complication ([place])    ? type   . Epilepsy ([place])    1994  . Hypertension     Allergies Allergies  Allergen Reactions  . Keflex [Cephalexin] Swelling    Pain in her throught.  . Penicillins Other (See Comments)    Childhood allergy - has never actually taken medication  Has patient had a PCN reaction causing immediate rash, facial/tongue/throat swelling, SOB or lightheadedness with hypotension: Unknown Has patient had a PCN reaction causing severe rash involving mucus membranes or skin necrosis: Unknown Has patient had a PCN reaction that required hospitalization: No Has patient had a PCN reaction occurring within the last 10 years: No If all of the above answers are "NO", then may proceed with Cephalosporin    IV Location/Drains/Wounds Patient Lines/Drains/Airways Status    Active Line/Drains/Airways    Name Placement date Placement time Site Days   Peripheral IV 05/07/20 Left Antecubital  05/07/20  2300  Antecubital  1   Peripheral IV 05/08/20 Right Antecubital 05/08/20  0005  Antecubital  less than 1          Labs/Imaging Results for orders placed or performed during the hospital encounter of 05/07/20 (from the past 48 hour(s))  CBC with Differential     Status: Abnormal   Collection Time: 05/07/20 10:31 PM  Result Value Ref Range   WBC 7.3 4.0 - 10.5 K/uL   RBC 2.98 (L) 3.87 - 5.11 MIL/uL   Hemoglobin 6.7 (LL) 12.0 - 15.0 g/dL    Comment: REPEATED TO VERIFY Reticulocyte Hemoglobin testing may be clinically  indicated, consider ordering this additional test [id number] THIS CRITICAL RESULT HAS VERIFIED AND BEEN CALLED TO RN Kashae Carstens,J BY KENYATTA BOWMAN ON 11 21 2021 AT 2336, AND HAS BEEN READ BACK. CRITICLE CALLED TO [place], J RN    HCT 22.0 (L) 36 - 46 %   MCV 73.8 (L) 80.0 - 100.0 fL   MCH 22.5 (L) 26.0 - 34.0 pg   MCHC 30.5 30.0 - 36.0 g/dL   RDW 16.7 (H) 11.5 - 15.5 %   Platelets 357 150 - 400 K/uL   nRBC 0.0 0.0 - 0.2 %   Neutrophils Relative % 83 %   Neutro Abs 6.1 1.7 - 7.7 K/uL   Lymphocytes Relative 12 %   Lymphs Abs 0.9 0.7 - 4.0 K/uL   Monocytes Relative 4 %   Monocytes Absolute 0.3 0.1 - 1.0 K/uL   Eosinophils Relative 1 %   Eosinophils Absolute 0.1 0.0 - 0.5 K/uL   Basophils Relative 0 %   Basophils Absolute 0.0 0.0 - 0.1 K/uL   Immature Granulocytes 0 %   Abs Immature Granulocytes 0.02 0.00 - 0.07 K/uL    Comment: Performed at [facility], [place] [address], [place], [place] [address]  Basic metabolic panel     Status: Abnormal   Collection Time: 05/07/20 10:31 PM  Result Value Ref Range   Sodium 143 135 - 145 mmol/L   Potassium 2.4 (LL) 3.5 - 5.1 mmol/L   Chloride 97 (L) 98 - 111 mmol/L   CO2 31 22 - 32 mmol/L   Glucose, Bld 135 (H) 70 - 99 mg/dL    Comment: Glucose reference range applies only to samples taken after fasting for at least 8 hours.   BUN 20 8 - 23 mg/dL   Creatinine, Ser 2.80 (H) 0.44 - 1.00 mg/dL   Calcium 8.7 (L) 8.9 - 10.3 mg/dL   GFR, Estimated 19 (L) >60 mL/min    Comment: (NOTE) Calculated using the CKD-EPI Creatinine Equation (2021)    Anion gap 15 5 - 15    Comment: Performed at [facility], [place] [address], [place], [place] [address]  Urinalysis, Routine w reflex microscopic     Status: Abnormal   Collection Time: 05/08/20 12:02 AM  Result Value Ref Range   Color, Urine RED (A) YELLOW    Comment: BIOCHEMICALS MAY BE AFFECTED BY COLOR   APPearance TURBID (A) CLEAR   Specific Gravity, Urine   1.005 - 1.030    TEST NOT REPORTED DUE TO COLOR INTERFERENCE OF URINE PIGMENT   pH  5.0 - 8.0    TEST NOT REPORTED DUE TO COLOR INTERFERENCE OF URINE PIGMENT   Glucose, UA (A) NEGATIVE mg/dL    TEST NOT REPORTED DUE TO COLOR INTERFERENCE OF URINE PIGMENT   Hgb urine dipstick (A) NEGATIVE    TEST NOT  REPORTED DUE TO COLOR INTERFERENCE OF URINE PIGMENT   Bilirubin Urine (A) NEGATIVE    TEST NOT REPORTED DUE TO COLOR INTERFERENCE OF URINE PIGMENT   Ketones, ur (A) NEGATIVE mg/dL    TEST NOT REPORTED DUE TO COLOR INTERFERENCE OF URINE PIGMENT   Protein, ur (A) NEGATIVE mg/dL    TEST NOT REPORTED DUE TO COLOR INTERFERENCE OF URINE PIGMENT   Nitrite (A) NEGATIVE    TEST NOT REPORTED DUE TO COLOR INTERFERENCE OF URINE PIGMENT   Leukocytes,Ua (A) NEGATIVE    TEST NOT REPORTED DUE TO COLOR INTERFERENCE OF URINE PIGMENT    Comment: Performed at [facility], [place] [address], [place], [place] [address]  Protime-INR     Status: None   Collection Time: 05/08/20 12:02 AM  Result Value Ref Range   Prothrombin Time 14.5 11.4 - 15.2 seconds   INR 1.2 0.8 - 1.2    Comment: (NOTE) INR goal varies based on device and disease states. Performed at [facility], [place] [address] [place], [place] [address]   APTT     Status: None   Collection Time: 05/08/20 12:02 AM  Result Value Ref Range   aPTT 31 24 - 36 seconds    Comment: Performed at [facility], [place] [address], [place], [place] [address]  Urinalysis, Microscopic (reflex)     Status: Abnormal   Collection Time: 05/08/20 12:02 AM  Result Value Ref Range   RBC / HPF >50 0 - 5 RBC/hpf   WBC, UA 21-50 0 - 5 WBC/hpf   Bacteria, UA RARE (A) NONE SEEN   Squamous Epithelial / LPF 0-5 0 - 5    Comment: Performed at [facility], [place] [address], [place], [place] [address]  Resp Panel by RT-PCR (Flu A&B, Covid) Nasopharyngeal Swab     Status: None   Collection Time: 05/08/20  12:38 AM   Specimen: Nasopharyngeal Swab; Nasopharyngeal(NP) swabs in vial transport medium  Result Value Ref Range   SARS Coronavirus 2 by RT PCR NEGATIVE NEGATIVE    Comment: (NOTE) SARS-CoV-2 target nucleic acids are NOT DETECTED.  The SARS-CoV-2 RNA is generally detectable in upper respiratory specimens during the acute phase of infection. The lowest concentration of SARS-CoV-2 viral copies this assay can detect is 138 copies/mL. A negative result does not preclude SARS-Cov-2 infection and should not be used as the sole basis for treatment or other patient management decisions. A negative result may occur with  improper specimen collection/handling, submission of specimen other than nasopharyngeal swab, presence of viral mutation(s) within the areas targeted by this assay, and inadequate number of viral copies(<138 copies/mL). A negative result must be combined with clinical observations, patient history, and epidemiological information. The expected result is Negative.  Fact Sheet for Patients:  EntrepreneurPulse.com.au  Fact Sheet for Healthcare Providers:  IncredibleEmployment.be  This test is no t yet approved or cleared by the [place] FDA and  has been authorized for detection and/or diagnosis of SARS-CoV-2 by FDA under an Emergency Use Authorization (EUA). This EUA will remain  in effect (meaning this test can be used) for the duration of the COVID-19 declaration under Section 564(b)(1) of the Act, 21 U.S.C.section 360bbb-3(b)(1), unless the authorization is terminated  or revoked sooner.       Influenza A by PCR NEGATIVE NEGATIVE   Influenza B by PCR NEGATIVE NEGATIVE    Comment: (NOTE) The Xpert Xpress SARS-CoV-2/FLU/RSV plus assay is intended as an aid in  the diagnosis of influenza from Nasopharyngeal swab specimens and should not be used as a sole basis for treatment. Nasal washings and aspirates are unacceptable for  Xpert Xpress SARS-CoV-2/FLU/RSV testing.  Fact Sheet for Patients: EntrepreneurPulse.com.au  Fact Sheet for Healthcare Providers: IncredibleEmployment.be  This test is not yet approved or cleared by the [place] FDA and has been authorized for detection and/or diagnosis of SARS-CoV-2 by FDA under an Emergency Use Authorization (EUA). This EUA will remain in effect (meaning this test can be used) for the duration of the COVID-19 declaration under Section 564(b)(1) of the Act, 21 U.S.C. section 360bbb-3(b)(1), unless the authorization is terminated or revoked.  Performed at [facility], [place] [address] [place], [place] [address]    No results found.  Pending Labs Unresulted Labs (From admission, onward)          Start     Ordered   05/08/20 0500  CBC  Tomorrow morning,   R        05/08/20 0126   05/08/20 5176  Basic metabolic panel  Tomorrow morning,   R        05/08/20 0126   05/08/20 0057  Vitamin B12  (Anemia Panel (PNL))  Add-on,   AD        05/08/20 0057   05/08/20 0057  Folate  (Anemia Panel (PNL))  Add-on,   AD        05/08/20 0057   05/08/20 0057  Iron and TIBC  (Anemia Panel (PNL))  Add-on,   AD        05/08/20 0057   05/08/20 0057  Ferritin  (Anemia Panel (PNL))  Add-on,   AD        05/08/20 0057   05/08/20 0056  Magnesium  Add-on,   AD        05/08/20 0056   05/08/20 0036  Prepare RBC (crossmatch)  (Emergency Blood Administration - Red Blood Cells)  Once,   R       Question Answer Comment  # of Units 2 units   Transfusion Indications Symptomatic Anemia   Number of Units to Keep Ahead NO units ahead   If emergent release call blood bank [facility] [phone]      05/08/20 0038   05/07/20 2358  ABO/Rh  Once,   R        05/07/20 2358   05/07/20 2347  Type and screen [place]  Once,   STAT       Comments: [place]    05/07/20 2346   05/07/20 2231   Urine culture  ONCE - STAT,   STAT        05/07/20 2230          Vitals/Pain Today's Vitals   05/08/20 0036 05/08/20 0100 05/08/20 0130 05/08/20 0200  BP: 133/65 120/74 129/74 124/70  Pulse: 81 77 82 80  Resp: 15 11 (!) 24 12  Temp: 97.6 F (36.4 C)     TempSrc: Oral     SpO2: 95% 93% 100% 98%  Weight:      Height:      PainSc:        Isolation Precautions No active isolations  Medications Medications  0.9 %  sodium chloride infusion (Manually program via Guardrails IV Fluids) (0 mLs Intravenous Hold 05/08/20 0143)  potassium chloride 10 mEq in 100 mL IVPB (0 mEq Intravenous Stopped 05/08/20 0231)  acetaminophen (TYLENOL) tablet 650 mg (has no administration  in time range)    Or  acetaminophen (TYLENOL) suppository 650 mg (has no administration in time range)  ondansetron (ZOFRAN) tablet 4 mg ( Oral See Alternative 05/08/20 0148)    Or  ondansetron (ZOFRAN) injection 4 mg (4 mg Intravenous Given 05/08/20 0148)  ferrous sulfate tablet 650 mg (has no administration in time range)  hydrOXYzine (ATARAX/VISTARIL) tablet 25 mg (has no administration in time range)  morphine (MSIR) tablet 15 mg (has no administration in time range)  pravastatin (PRAVACHOL) tablet 20 mg (has no administration in time range)  sertraline (ZOLOFT) tablet 100 mg (has no administration in time range)  insulin aspart (novoLOG) injection 0-9 Units (has no administration in time range)  potassium chloride 20 MEQ/15ML (10%) solution 60 mEq (60 mEq Oral Given 05/08/20 0148)    Mobility walks

## 2020-05-08 NOTE — Care Management Obs Status (Signed)
[place] NOTIFICATION   Patient Details  Name: Aryan Bello MRN: [id number] Date of Birth: [birth year]   Medicare Observation Status Notification Given:  Yes    Purcell Mouton, RN 05/08/2020, 12:33 PM

## 2020-05-08 NOTE — Plan of Care (Signed)
POC initiated 

## 2020-05-08 NOTE — Progress Notes (Signed)
[facility] PROGRESS NOTE   Latoya Green KVQ:[id number] DOB: [birth year] DOA: 05/07/2020  PCP: Caren Macadam, MD  Brief History/Interval Summary: 64 y.o. 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 Patient has been having ongoing intermittent vaginal bleeding and hematuria secondary to her invasive cervical cancer.   Patient has been seen by oncology, Dr. Alvy Bimler, and radiation/oncology Dr. Sondra Come.  She was planned to start chemotherapy and radiation however MRI pelvis 03/21/2020 showed that the uterine cervix mass was invading the bladder as well as bilateral hydroureteronephrosis.  She was seen by urology, Dr. Tresa Moore, with plan for cystoscopy with retrograde pyelogram/ureteral stent placement before any further therapy could be initiated however patient did not keep her procedure appointment.  Patient mentioned that she did not want to pursue any invasive procedures chemotherapy or radiation.  She is interested in hospice services.    Reason for Visit: Vaginal bleeding as a result of invasive cervical cancer  Consultants: Palliative care  Procedures: None yet  Antibiotics: Anti-infectives (From admission, onward)   None      Subjective/Interval History: Patient denies any pain.  Has been having bleeding from her vaginal area for the last 2 to 3 days.  Interested in hospice services.     Assessment/Plan:  Acute kidney injury with hypokalemia Presented with a creatinine of 2.8.  Creatinine was normal a month ago.  This is most likely related to her invasive cervical cancer involving the urinary bladder.  She has known bilateral hydroureteronephrosis.  Apparently cystoscopy stent was planned however patient did not keep that appointment.  It looks based on oncology notes patient has not been oncologist  as well.  Patient mentions that she does not want to go any further testing for procedures.  She is interested in speaking with palliative care and perhaps consider hospice. We will give her IV fluids for now.  Recheck labs tomorrow.  Renal ultrasound was done which showed bilateral hydronephrosis and a mass in the urinary bladder.  Acute on chronic blood loss anemia This is likely secondary to her vaginal bleeding from the cervical cancer.  Hemoglobin was 6.7.  She was transfused 1 unit of PRBC.  We will recheck labs tomorrow.  Invasive squamous cell carcinoma of the cervix The tumor apparently involves her urinary bladder causing bilateral hydronephrosis.  Patient followed by medical oncology surgical service radiation oncology Dr. Randa Ngo.  Dr. Tammi Klippel from urology was also consulted.  Patient mentioned to the admitting doctor that she did not want any chemotherapy radiation for invasive procedures.  Palliative care has been consulted.  She is DNR.  Pain control as needed.  Diabetes mellitus type 2 Holding Metformin. Sensitive SSI.  No recent HbA1c in our system.  Essential hypertension Holding lisinopril.  Monitor blood pressures closely.  Hyperlipidemia Continue statin.  History of depression and anxiety Continue sertraline.  DVT Prophylaxis: SCDs Code Status: DNR Family Communication: Discussed with the patient.  No family at bedside. Disposition Plan: Unclear  Status is: Observation  The patient will require care spanning > 2 midnights and should be moved to inpatient because: IV treatments appropriate due to intensity of illness or inability to take PO and Inpatient level of care appropriate due to severity of illness  Dispo: The patient is from: Home              Anticipated d/c is to: To  be determined              Anticipated d/c date is: 1 day              Patient currently is not medically stable to d/c.        Medications:  Scheduled: . sodium chloride    Intravenous Once  . ferrous sulfate  650 mg Oral Daily  . insulin aspart  0-9 Units Subcutaneous TID WC  . pravastatin  20 mg Oral Daily  . sertraline  100 mg Oral Daily   Continuous:  PPJ:[id number] **OR** acetaminophen, hydrOXYzine, morphine, ondansetron **OR** ondansetron (ZOFRAN) IV   Objective:  Vital Signs  Vitals:   05/08/20 0311 05/08/20 0342 05/08/20 0525 05/08/20 0908  BP:  114/72  (!) 123/54  Pulse:  79  79  Resp:  16  19  Temp:  97.7 F (36.5 C)  (!) 97.5 F (36.4 C)  TempSrc:    Oral  SpO2:  94%  94%  Weight: 77.1 kg  77.1 kg   Height: 5\' 9"  (1.753 m)  5\' 9"  (1.753 m)     Intake/Output Summary (Last 24 hours) at 05/08/2020 0949 Last data filed at 05/08/2020 0554 Gross per 24 hour  Intake 785 ml  Output 125 ml  Net 660 ml   Filed Weights   05/07/20 2042 05/08/20 0311 05/08/20 0525  Weight: 77.1 kg 77.1 kg 77.1 kg    General appearance: Awake alert.  In no distress Resp: Clear to auscultation bilaterally.  Normal effort Cardio: S1-S2 is normal regular.  No S3-S4.  No rubs murmurs or bruit GI: Abdomen is soft.  Nontender nondistended.  Bowel sounds are present normal.  No masses organomegaly Extremities: No edema.  Full range of motion of lower extremities. Neurologic: .  No focal neurological deficits.    Lab Results:  Data Reviewed: I have personally reviewed following labs and imaging studies  CBC: Recent Labs  Lab 05/07/20 2231  WBC 7.3  NEUTROABS 6.1  HGB 6.7*  HCT 22.0*  MCV 73.8*  PLT 983    Basic Metabolic Panel: Recent Labs  Lab 05/07/20 2231  NA 143  K 2.4*  CL 97*  CO2 31  GLUCOSE 135*  BUN 20  CREATININE 2.80*  CALCIUM 8.7*    GFR: Estimated Creatinine Clearance: 21.8 mL/min (A) (by C-G formula based on SCr of 2.8 mg/dL (H)).   Coagulation Profile: Recent Labs  Lab 05/08/20 0002  INR 1.2      Recent Results (from the past 240 hour(s))  Resp Panel by RT-PCR (Flu A&B, Covid) Nasopharyngeal Swab      Status: None   Collection Time: 05/08/20 12:38 AM   Specimen: Nasopharyngeal Swab; Nasopharyngeal(NP) swabs in vial transport medium  Result Value Ref Range Status   SARS Coronavirus 2 by RT PCR NEGATIVE NEGATIVE Final    Comment: (NOTE) SARS-CoV-2 target nucleic acids are NOT DETECTED.  The SARS-CoV-2 RNA is generally detectable in upper respiratory specimens during the acute phase of infection. The lowest concentration of SARS-CoV-2 viral copies this assay can detect is 138 copies/mL. A negative result does not preclude SARS-Cov-2 infection and should not be used as the sole basis for treatment or other patient management decisions. A negative result may occur with  improper specimen collection/handling, submission of specimen other than nasopharyngeal swab, presence of viral mutation(s) within the areas targeted by this assay, and inadequate number of viral copies(<138 copies/mL). A negative result must be combined with clinical observations, patient  history, and epidemiological information. The expected result is Negative.  Fact Sheet for Patients:  EntrepreneurPulse.com.au  Fact Sheet for Healthcare Providers:  IncredibleEmployment.be  This test is no t yet approved or cleared by the [place] FDA and  has been authorized for detection and/or diagnosis of SARS-CoV-2 by FDA under an Emergency Use Authorization (EUA). This EUA will remain  in effect (meaning this test can be used) for the duration of the COVID-19 declaration under Section 564(b)(1) of the Act, 21 U.S.C.section 360bbb-3(b)(1), unless the authorization is terminated  or revoked sooner.       Influenza A by PCR NEGATIVE NEGATIVE Final   Influenza B by PCR NEGATIVE NEGATIVE Final    Comment: (NOTE) The Xpert Xpress SARS-CoV-2/FLU/RSV plus assay is intended as an aid in the diagnosis of influenza from Nasopharyngeal swab specimens and should not be used as a sole basis  for treatment. Nasal washings and aspirates are unacceptable for Xpert Xpress SARS-CoV-2/FLU/RSV testing.  Fact Sheet for Patients: EntrepreneurPulse.com.au  Fact Sheet for Healthcare Providers: IncredibleEmployment.be  This test is not yet approved or cleared by the [place] FDA and has been authorized for detection and/or diagnosis of SARS-CoV-2 by FDA under an Emergency Use Authorization (EUA). This EUA will remain in effect (meaning this test can be used) for the duration of the COVID-19 declaration under Section 564(b)(1) of the Act, 21 U.S.C. section 360bbb-3(b)(1), unless the authorization is terminated or revoked.  Performed at [facility], [place] [address], [place], [place] [address]       Radiology Studies: US RENAL  Result Date: 05/08/2020 CLINICAL DATA:  Acute kidney injury. History of hypertension and diabetes. EXAM: RENAL / URINARY TRACT ULTRASOUND COMPLETE COMPARISON:  None. FINDINGS: Right Kidney: Renal measurements: 13.5 x 6 x 7.6 cm = volume: 320 mL. Moderate right hydronephrosis. Mild diffuse parenchymal thinning. No mass lesions. Left Kidney: Renal measurements: 15.7 x 6.9 x 7.6 cm = volume: 425 mL. Moderate left renal hydronephrosis. Mild diffuse parenchymal thinning. No focal mass lesions. Bladder: The bladder is decompressed around a large heterogeneous mass lesion. The mass measures about 11.4 x 7 x 9 cm. This could represent an intrinsic bladder mass or polyp, or a large intraluminal blood clot. Correlate with urinalysis. Cystoscopy may be useful for further evaluation. Other: None. IMPRESSION: 1. Bilateral renal parenchymal thinning with moderate bilateral hydronephrosis. 2. Large heterogeneous mass in the bladder. Consider polyp, blood clot, or other mass. Cystoscopy may be useful for further evaluation. Electronically Signed   By: Lucienne Capers M.D.   On: 05/08/2020 06:38       LOS: 0 days    [facility] Pager on [email or web address]  05/08/2020, 9:49 AM

## 2020-05-08 NOTE — Progress Notes (Signed)
Report received from [facility] RN/ED. Pt received to unit rm [id number] via stretcher. Pt handbook given to pt and education initiated on use of call bell for needs/safety

## 2020-05-08 NOTE — ED Notes (Signed)
CRITICAL VALUE STICKER  CRITICAL VALUE: K+ 2.4  RECEIVER (on-site recipient of call): Maylon Cos T RN  DATE & TIME NOTIFIED: 05/08/20 1229a  MESSENGER (representative from lab): Pam  MD NOTIFIED: Nehemiah Settle PA  [place]: [id number]  RESPONSE: see orders

## 2020-05-09 ENCOUNTER — Ambulatory Visit: Payer: Medicare Other

## 2020-05-09 DIAGNOSIS — C539 Malignant neoplasm of cervix uteri, unspecified: Secondary | ICD-10-CM | POA: Diagnosis not present

## 2020-05-09 DIAGNOSIS — D62 Acute posthemorrhagic anemia: Secondary | ICD-10-CM

## 2020-05-09 DIAGNOSIS — G893 Neoplasm related pain (acute) (chronic): Secondary | ICD-10-CM | POA: Diagnosis not present

## 2020-05-09 DIAGNOSIS — N179 Acute kidney failure, unspecified: Secondary | ICD-10-CM | POA: Diagnosis not present

## 2020-05-09 LAB — MAGNESIUM: Magnesium: 2.3 mg/dL (ref 1.7–2.4)

## 2020-05-09 LAB — COMPREHENSIVE METABOLIC PANEL
ALT: 10 U/L (ref 0–44)
AST: 17 U/L (ref 15–41)
Albumin: 3.4 g/dL — ABNORMAL LOW (ref 3.5–5.0)
Alkaline Phosphatase: 62 U/L (ref 38–126)
Anion gap: 13 (ref 5–15)
BUN: 19 mg/dL (ref 8–23)
CO2: 27 mmol/L (ref 22–32)
Calcium: 8.7 mg/dL — ABNORMAL LOW (ref 8.9–10.3)
Chloride: 101 mmol/L (ref 98–111)
Creatinine, Ser: 2.87 mg/dL — ABNORMAL HIGH (ref 0.44–1.00)
GFR, Estimated: 18 mL/min — ABNORMAL LOW (ref >60)
Glucose, Bld: 102 mg/dL — ABNORMAL HIGH (ref 70–99)
Potassium: 3 mmol/L — ABNORMAL LOW (ref 3.5–5.1)
Sodium: 141 mmol/L (ref 135–145)
Total Bilirubin: 0.5 mg/dL (ref 0.3–1.2)
Total Protein: 6.8 g/dL (ref 6.5–8.1)

## 2020-05-09 LAB — GLUCOSE, CAPILLARY
Glucose-Capillary: 83 mg/dL (ref 70–99)
Glucose-Capillary: 115 mg/dL — ABNORMAL HIGH (ref 70–99)
Glucose-Capillary: 120 mg/dL — ABNORMAL HIGH (ref 70–99)

## 2020-05-09 LAB — CBC
HCT: 26.4 % — ABNORMAL LOW (ref 36.0–46.0)
Hemoglobin: 8.2 g/dL — ABNORMAL LOW (ref 12.0–15.0)
MCH: 24.3 pg — ABNORMAL LOW (ref 26.0–34.0)
MCHC: 31.1 g/dL (ref 30.0–36.0)
MCV: 78.3 fL — ABNORMAL LOW (ref 80.0–100.0)
Platelets: 353 10*3/uL (ref 150–400)
RBC: 3.37 MIL/uL — ABNORMAL LOW (ref 3.87–5.11)
RDW: 17.6 % — ABNORMAL HIGH (ref 11.5–15.5)
WBC: 11 10*3/uL — ABNORMAL HIGH (ref 4.0–10.5)
nRBC: 0 % (ref 0.0–0.2)

## 2020-05-09 MED ORDER — LORAZEPAM 2 MG/ML IJ SOLN
1.0000 mg | INTRAMUSCULAR | Status: DC | PRN
  Administered 2020-05-09 – 2020-05-12 (×3): 1 mg via INTRAVENOUS
  Filled 2020-05-09 (×3): qty 1

## 2020-05-09 MED ORDER — POTASSIUM CHLORIDE CRYS ER 20 MEQ PO TBCR
40.0000 meq | EXTENDED_RELEASE_TABLET | Freq: Once | ORAL | Status: AC
  Administered 2020-05-09: 40 meq via ORAL
  Filled 2020-05-09: qty 2

## 2020-05-09 MED ORDER — HALOPERIDOL LACTATE 5 MG/ML IJ SOLN: 2.0000 mg | Freq: Four times a day (QID) | INTRAMUSCULAR | Status: DC | PRN

## 2020-05-09 MED ORDER — HALOPERIDOL LACTATE 5 MG/ML IJ SOLN
5.0000 mg | Freq: Once | INTRAMUSCULAR | Status: AC
  Administered 2020-05-09: 5 mg via INTRAVENOUS
  Filled 2020-05-09: qty 1

## 2020-05-09 MED ORDER — HYDROMORPHONE HCL 1 MG/ML IJ SOLN
1.0000 mg | INTRAMUSCULAR | Status: DC | PRN
  Administered 2020-05-09 – 2020-05-13 (×8): 1 mg via INTRAVENOUS
  Filled 2020-05-09 (×8): qty 1

## 2020-05-09 MED ORDER — POTASSIUM CHLORIDE 10 MEQ/100ML IV SOLN
10.0000 meq | INTRAVENOUS | Status: AC
  Administered 2020-05-09 (×2): 10 meq via INTRAVENOUS
  Filled 2020-05-09 (×2): qty 100

## 2020-05-09 MED ORDER — LORAZEPAM 2 MG/ML IJ SOLN
1.0000 mg | Freq: Once | INTRAMUSCULAR | Status: AC
  Administered 2020-05-09: 1 mg via INTRAVENOUS
  Filled 2020-05-09: qty 1

## 2020-05-09 NOTE — Consult Note (Signed)
GYNECOLOGIC ONCOLOGY INPATIENT CONSULTATION  Date of Service: 05/09/20 Consulting Provider: Jeral Pinch, MD  HISTORY OF PRESENT ILLNESS: Latoya Green is a 64 y.o. woman who is seen in consultation at the request of Dr. [place] Pink for evaluation of ongoing vaginal bleeding in the setting of known advanced cervical cancer.  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  PET scan showed an avid cervical mass with suspected bladder invasion as well as bilateral hydroureter.  MRI of the pelvis performed in early October showed a 5.4 cm circumferential cervical mass with parametrial invasion, invasion into the bladder trigone, and hydroureteronephrosis bilaterally.  She was scheduled for cystoscopy with ureteral stent placement with plan to initiate chemoradiation.  Unfortunately, she missed multiple appointments and ultimately never initiated treatment.  She presented to the emergency department on the twenty-first with heavy vaginal bleeding and was found to be anemic with a hemoglobin of 6.7 and to have acute kidney injury with a creatinine of 2.8.  Latoya Green voiced on admission and subsequently desire not to pursue any cancer directed treatment or invasive procedures.  Given continued vaginal bleeding and plan for palliative care consultation today, I was called to ask about any options for her vaginal bleeding.  On my visit today, the patient is oriented to time and place, but either does not answer questions or answers them inappropriately.  She is very confused and falls asleep several times during our conversation.  She spends most of the time talking about whether I can take a check to pay her phone bill to [place].  She denies any pain, endorses vaginal bleeding as well as urinary symptoms, although cannot verbalize what symptoms she is having.  Asked for a  diaper because of her urinary issues.  PAST MEDICAL HISTORY: Past Medical History:  Diagnosis Date  . Anemia   . Anxiety   . Cervical cancer ([place])   . Depression   . Diabetes mellitus without complication ([place])    ? type   . Epilepsy ([place])    1994  . Hypertension     PAST SURGICAL HISTORY: Past Surgical History:  Procedure Laterality Date  . skin ca removed      OB/GYN HISTORY: OB History  Gravida Para Term Preterm AB Living  1 1          SAB TAB Ectopic Multiple Live Births               # Outcome Date GA Lbr Len/2nd Weight Sex Delivery Anes PTL Lv  1 Para             MEDICATIONS:  Current Facility-Administered Medications:  .  0.45 % sodium chloride infusion, , Intravenous, Continuous, Bonnielee Haff, MD, Last Rate: 100 mL/hr at 05/08/20 2041, New Bag at 05/08/20 2041 .  0.9 %  sodium chloride infusion (Manually program via Guardrails IV Fluids), , Intravenous, Once, Charlann Lange, PA-C, Held at 05/08/20 0143 .  acetaminophen (TYLENOL) tablet 650 mg, 650 mg, Oral, Q6H PRN **OR** acetaminophen (TYLENOL) suppository 650 mg, 650 mg, Rectal, Q6H PRN, Zada Finders R, MD .  carbamazepine (TEGRETOL) tablet 200 mg, 200 mg, Oral, Daily, Bonnielee Haff, MD, 200 mg at 05/09/20 1009 .  ferrous sulfate tablet 650 mg, 650 mg, Oral, Daily, Zada Finders R, MD, 650 mg at 05/09/20 1009 .  hydrOXYzine (ATARAX/VISTARIL) tablet 25 mg, 25 mg, Oral,  BID PRN, Lenore Cordia, MD, 25 mg at 05/08/20 2039 .  insulin aspart (novoLOG) injection 0-9 Units, 0-9 Units, Subcutaneous, TID WC, Patel, Vishal R, MD .  morphine (MSIR) tablet 15 mg, 15 mg, Oral, Q4H PRN, Lenore Cordia, MD, 15 mg at 05/09/20 0120 .  ondansetron (ZOFRAN) tablet 4 mg, 4 mg, Oral, Q6H PRN, 4 mg at 05/08/20 1129 **OR** ondansetron (ZOFRAN) injection 4 mg, 4 mg, Intravenous, Q6H PRN, Lenore Cordia, MD, 4 mg at 05/08/20 0148 .  pravastatin (PRAVACHOL) tablet 20 mg, 20 mg, Oral, Daily, Zada Finders R, MD, 20 mg at 05/09/20  1010 .  sertraline (ZOLOFT) tablet 100 mg, 100 mg, Oral, Daily, Zada Finders R, MD, 100 mg at 05/09/20 1010  ALLERGIES: Allergies  Allergen Reactions  . Keflex [Cephalexin] Swelling    Pain in her throught.  . Penicillins Other (See Comments)    Childhood allergy - has never actually taken medication  Has patient had a PCN reaction causing immediate rash, facial/tongue/throat swelling, SOB or lightheadedness with hypotension: Unknown Has patient had a PCN reaction causing severe rash involving mucus membranes or skin necrosis: Unknown Has patient had a PCN reaction that required hospitalization: No Has patient had a PCN reaction occurring within the last 10 years: No If all of the above answers are "NO", then may proceed with Cephalosporin    FAMILY HISTORY: Family History  Problem Relation Age of Onset  . Leukemia Father   . Cancer Sister        unsure type, thinks may be GYN  . Breast cancer Maternal Aunt   . Breast cancer Maternal Aunt   . Colon cancer Neg Hx     SOCIAL HISTORY: Social History   Socioeconomic History  . Marital status: Divorced    Spouse name: Not on file  . Number of children: Not on file  . Years of education: 1  . Highest education level: Not on file  Occupational History  . Not on file  Tobacco Use  . Smoking status: Current Every Day Smoker    Packs/day: 1.00    Years: 35.00    Pack years: 35.00    Types: Cigarettes  . Smokeless tobacco: Never Used  Vaping Use  . Vaping Use: Never used  Substance and Sexual Activity  . Alcohol use: No  . Drug use: No  . Sexual activity: Not Currently    Birth control/protection: Abstinence  Other Topics Concern  . Not on file  Social History Narrative  . Not on file   Social Determinants of Health   Financial Resource Strain:   . Difficulty of Paying Living Expenses: Not on file  Food Insecurity:   . Worried About Charity fundraiser in the Last Year: Not on file  . Ran Out of Food in the Last  Year: Not on file  Transportation Needs:   . Lack of Transportation (Medical): Not on file  . Lack of Transportation (Non-Medical): Not on file  Physical Activity:   . Days of Exercise per Week: Not on file  . Minutes of Exercise per Session: Not on file  Stress:   . Feeling of Stress : Not on file  Social Connections:   . Frequency of Communication with Friends and Family: Not on file  . Frequency of Social Gatherings with Friends and Family: Not on file  . Attends Religious Services: Not on file  . Active Member of Clubs or Organizations: Not on file  . Attends Club or  Organization Meetings: Not on file  . Marital Status: Not on file  Intimate Partner Violence:   . Fear of Current or Ex-Partner: Not on file  . Emotionally Abused: Not on file  . Physically Abused: Not on file  . Sexually Abused: Not on file    REVIEW OF SYSTEMS: Unable to perform given patient's confusion  PHYSICAL EXAM: BP (!) 157/65 (BP Location: Right Arm)   Pulse 92   Temp 99.3 F (37.4 C) (Oral)   Resp 16   Ht 5\' 9"  (1.753 m)   Wt 169 lb 15.6 oz (77.1 kg)   SpO2 97%   BMI 25.10 kg/m  General: Falling asleep during our discussion, oriented only to time and place, no acute distress. HEENT: Normocephalic, atraumatic.  Sclera anicteric, mild conjunctival pallor. Chest: Unlabored breathing on room air.  LABORATORY AND RADIOLOGIC DATA:     Component Value Date/Time   WBC 11.0 (H) 05/09/2020 0439   RBC 3.37 (L) 05/09/2020 0439   HGB 8.2 (L) 05/09/2020 0439   HCT 26.4 (L) 05/09/2020 0439   PLT 353 05/09/2020 0439   MCV 78.3 (L) 05/09/2020 0439   MCH 24.3 (L) 05/09/2020 0439   MCHC 31.1 05/09/2020 0439   RDW 17.6 (H) 05/09/2020 0439   LYMPHSABS 0.9 05/07/2020 2231   MONOABS 0.3 05/07/2020 2231   EOSABS 0.1 05/07/2020 2231   BASOSABS 0.0 05/07/2020 2231   CMP Latest Ref Rng & Units 05/09/2020 05/08/2020 05/07/2020  Glucose 70 - 99 mg/dL 102(H) 113(H) 135(H)  BUN 8 - 23 mg/dL 19 19 20    Creatinine 0.44 - 1.00 mg/dL 2.87(H) 2.94(H) 2.80(H)  Sodium 135 - 145 mmol/L 141 139 143  Potassium 3.5 - 5.1 mmol/L 3.0(L) 2.8(L) 2.4(LL)  Chloride 98 - 111 mmol/L 101 100 97(L)  CO2 22 - 32 mmol/L 27 27 31   Calcium 8.9 - 10.3 mg/dL 8.7(L) 8.4(L) 8.7(L)  Total Protein 6.5 - 8.1 g/dL 6.8 - -  Total Bilirubin 0.3 - 1.2 mg/dL 0.5 - -  Alkaline Phos 38 - 126 U/L 62 - -  AST 15 - 41 U/L 17 - -  ALT 0 - 44 U/L 10 - -    ASSESSMENT AND PLAN: Latoya Green is a 64 y.o. woman with advanced stage cervical cancer admitted with significant vaginal bleeding in the setting of a known necrotic cervical tumor with associated AKI.  The patient's presentation is consistent with progressive and advanced stage cervical cancer.  Unfortunately, I anticipate she will continue to have vaginal bleeding and may have worsening of her bleeding without any cancer directed intervention.  Options to help decrease bleeding would be short course of palliative radiation or consideration for uterine artery embolization.  The patient is quite confused today and not able to participate in any way in a discussion about her current status or either of these temporizing interventions.  She has voiced on multiple occasions this hospitalization that she does not want any cancer directed therapy and wants to focus on quality of life.  While she has not voiced this to me, her many missed appointments at the cancer center, support her decision not to proceed with cancer directed therapy.  At this time, I do not think that the patient is capable of making a decision about a temporizing procedure for her vaginal bleeding.  I suspect that her cancer will cause her death in the near future from either hypovolemia due to ongoing blood loss or kidney failure, likely due to progression of her tumor causing  significant dilation and backup of her urinary tract.  This is especially true outside of the hospital setting.  I support palliative care  involvement for discussion about end-of-life care options.  The above assessment and plan was communicated to the patient's primary team.  Jeral Pinch MD Gynecologic Oncology

## 2020-05-09 NOTE — Consult Note (Signed)
Palliative Care Consult Note  64 yo woman with advanced stage cervical cancer and significant vaginal bleeding and pain from extensive local invasion also causing ureteral obstruction and renal failure. She has missed many appointments in the cancer center and did not show up for planned urological intervention and stent placement. She presented to the ED via EMS from home with severe anemia and AKI. She has voiced a desire to not pursue treatment of her cancer or undergo any procedures.  On my visit this evening the patient is agitated, when I enter her room there is blood all over her bed and the floor, she is ruffling through her purse and there are personal papers and old newspaper clippings scattered on her bed. I introduced myself as a palliative care doctor and she asked what that was. She in general acted suspicious of my visit and somewhat anxious. She was trying to pull her hair out of her face and said "on my drivers license I look like a real person and have a real face" and showed me her license. She didn't make good eye contact with me. She was able to tell me that she had cervical cancer and that it was terminal. I asked her what her thoughts were about treatment options that been presented to her and she told me that she had read about them and did not agree to those "because of what they would do to her body".  I asked her what she expected her cancer to do to her body and she did not respond.   During our conversation I noticed that she had tangential thoughts that did not pertain to the subject that we were discussing-she talked about having to pay her rent and her phone bill.  When asked her about family support seem to trigger episode of coherent speech, possibly delusional ideas.  She told me that she had a son but that "his parents were switched at birth".  She told me he was a "disabled and handicapped little boy".  I asked her about her past psychiatric history-she tells me that she  is "just on some antidepressants just like lots of women". She seemed to be having word finding issues as well and was trying very hard to convince me that her thinking was not "altered". Randomly she told me that they replaced her potassium today out of context of our discussion.   She was hesitant to provide any relative or emergency contact information. She bolted out of the bed to the bathroom and again bled all over the floor during our meeting. I discussed this with nursing today and the patient has refused to keep pads in place or to not get out of bed with assistance. She throws the pads in the floor and has throughout the day become increasingly agitated and difficult to redirect.  Given the clinical state I found her in I was concerned for psychosis and in deeper chart review discovered that she has an extensive psychiatric history- schizoaffective disorder, major depression, suicidal ideation and involuntary commitment. Decompensated mental illness along with a poor support system will make treatment and interventions decisions extremely difficult. She does not have a legal guardian to my knowledge or any ACP documents. She historically has been followed at [facility].  RN was able to obtain the patient's Aunts infomration:  [phone] - [facility])  I made a phone call to Ms. Wynetta Emery who is the patient's Elenor Legato, she is 64 years old and she made a commitment  to the patient's mother to "look after her". Ms. Wynetta Emery rents a small home to the patient. Ms. Wynetta Emery tells me Ms. Petree has a long history of mental illness and has largely not been able to maintain relationships with any of her family members. Her pattern is usually "checking into a [facility] hospital about every 2 months". This started when her husband was killed in a car accident at a very young age and shortly after their son was born. Their son was diagnosed with mental retardation and Ms. Guajardo was a single mom and cared for him  all of his life until APS "took her son" after he was left alone while she had prolonged psych hospitalizations. He now lives in an adult foster care group home.   Ms. Wynetta Emery states that she would like to come visit the patient tomorrow. Her neighbor had called when EMS came out, Ms. Wynetta Emery called [facility] thinking that was where she had been admitted. Given patient's confusion and lack of any other contact I shared with Ms. Wynetta Emery that the patient had terminal cancer. The patient had not shared any of this but has been more withdrawn over the past few months.  Recommendations:  1. Her bleeding is rapid and I anticipate her Hb will fall precipitously leading to decreased responsiveness and worsening renal failure. With no intervention for the bleeding and no stenting- her prognosis could be <2 weeks.  2. She will need a hospice facility for EOL care.  3. She does complain of severe pain intermittently- will use IV hydromorphone.  4. Anxiolytics and Anti-psychotics: Gave Haldol 5mg  X1 for acute psychosis. Will also order PRN Ativan and additional haldol PRN for agitation. She may be able to sleep and clear or improve her psychosis.  Comfort care is most appropriate in this situation.   Lane Hacker, DO Palliative Medicine

## 2020-05-09 NOTE — Progress Notes (Addendum)
[facility] PROGRESS NOTE   Latoya Green YIF:[id number] DOB: 02-Aug-1955 DOA: 05/07/2020  PCP: Caren Macadam, MD  Brief History/Interval Summary: 65 y.o. 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 Patient has been having ongoing intermittent vaginal bleeding and hematuria secondary to her invasive cervical cancer.   Patient has been seen by oncology, Dr. Alvy Bimler, and radiation/oncology Dr. Sondra Come.  She was planned to start chemotherapy and radiation however MRI pelvis 03/21/2020 showed that the uterine cervix mass was invading the bladder as well as bilateral hydroureteronephrosis.  She was seen by urology, Dr. Tresa Moore, with plan for cystoscopy with retrograde pyelogram/ureteral stent placement before any further therapy could be initiated however patient did not keep her procedure appointment.  Patient mentioned that she did not want to pursue any invasive procedures chemotherapy or radiation.  She is interested in hospice services.    Reason for Visit: Vaginal bleeding as a result of invasive cervical cancer  Consultants: Palliative care  Procedures: None yet  Antibiotics: Anti-infectives (From admission, onward)   None      Subjective/Interval History: Patient noted to be distracted this morning.  Apparently has been having vaginal bleeding also at night.  Also complaining of lower abdominal pain.  No nausea vomiting.       Assessment/Plan:  Acute kidney injury likely due to obstruction/hypokalemia Presented with a creatinine of 2.8.  Creatinine was normal a month ago.  This is most likely related to her invasive cervical cancer involving the urinary bladder.  She has known bilateral hydroureteronephrosis.  Apparently cystoscopy and stent was planned however patient did not keep that appointment.   It looks based on oncology notes patient has not been following up with oncologist as well.   Renal ultrasound was done which showed bilateral hydronephrosis and a mass in the urinary bladder. Patient mentioned that she did not want any further testing or treatment for her cancer.  She was interested in speaking with palliative care and perhaps consider hospice.  Waiting on palliative care input. She is noted to have hematuria most likely due to her invasive cervical cancer.  Renal function is stable.  Prognosis is very poor at this time.  Continue IV fluids. Potassium level better than yesterday but remains low.  Will be supplemented.  Magnesium 2.3.  Acute on chronic blood loss anemia This is likely secondary to her vaginal bleeding from the cervical cancer.  Noted to be actively bleeding.  Hemoglobin was 6.7.  She was transfused 1 unit of PRBC.   Hemoglobin noted to be 8.2.  Invasive squamous cell carcinoma of the cervix The tumor apparently involves her urinary bladder causing bilateral hydronephrosis.  Patient followed by medical oncology, Dr. Alvy Bimler, radiation oncology Dr. Sondra Come.  Dr. Tammi Klippel from urology was also involed.  Patient mentioned to the admitting doctor that she did not want any chemotherapy radiation for invasive procedures.  Palliative care has been consulted.  She is DNR.  Pain control as needed.  We will try and discuss with GYN oncology to see if there is any therapeutic option available to try and control her bleeding. Discussed with GYN oncology (Dr. Berline Lopes) who will try to talk to the patient as the only options to stop the bleeding would be either radiation treatment or embolization or combination of both.  Patient previously has mentioned that she is unwilling to consider any invasive  procedures or treatment.  Diabetes mellitus type 2 Holding Metformin. SSI.  Monitor CBGs  Essential hypertension Holding lisinopril due to renal failure.  Occasional high blood pressure  readings noted.  Continue to monitor.  Hyperlipidemia Continue statin.  History of depression and anxiety Continue sertraline.  DVT Prophylaxis: SCDs Code Status: DNR Family Communication: Discussed with the patient.  No family at bedside. Disposition Plan: Unclear  Status is: Inpatient  Remains inpatient appropriate because:Ongoing active pain requiring inpatient pain management and Inpatient level of care appropriate due to severity of illness   Dispo: The patient is from: Home              Anticipated d/c is to: TBD              Anticipated d/c date is: 2 days              Patient currently is not medically stable to d/c.        Medications:  Scheduled: . sodium chloride   Intravenous Once  . carbamazepine  200 mg Oral Daily  . ferrous sulfate  650 mg Oral Daily  . insulin aspart  0-9 Units Subcutaneous TID WC  . pravastatin  20 mg Oral Daily  . sertraline  100 mg Oral Daily   Continuous: . sodium chloride 100 mL/hr at 05/08/20 2041   NLZ:[id number] **OR** acetaminophen, hydrOXYzine, morphine, ondansetron **OR** ondansetron (ZOFRAN) IV   Objective:  Vital Signs  Vitals:   05/08/20 1431 05/08/20 1708 05/08/20 2045 05/09/20 0631  BP: 125/68 136/73 (!) 141/73 (!) 159/88  Pulse: 81 89 83 90  Resp: 18 18 18 18   Temp: 98.1 F (36.7 C) 98.3 F (36.8 C) 97.7 F (36.5 C) 98.4 F (36.9 C)  TempSrc: Oral Oral Oral Oral  SpO2: 95% 96% 92% 97%  Weight:      Height:        Intake/Output Summary (Last 24 hours) at 05/09/2020 0922 Last data filed at 05/08/2020 1800 Gross per 24 hour  Intake 1777.17 ml  Output 500 ml  Net 1277.17 ml   Filed Weights   05/07/20 2042 05/08/20 0311 05/08/20 0525  Weight: 77.1 kg 77.1 kg 77.1 kg    General appearance: Awake alert.  In no distress.  Noted to be distracted today. Resp: Clear to auscultation bilaterally.  Normal effort Cardio: S1-S2 is normal regular.  No S3-S4.  No rubs murmurs or bruit GI: Abdomen soft  but tender in the lower regions without any rebound rigidity or guarding.  No obvious masses organomegaly Bloody urine output noted.   Extremities: No edema.  Full range of motion of lower extremities. Neurologic:No focal neurological deficits.     Lab Results:  Data Reviewed: I have personally reviewed following labs and imaging studies  CBC: Recent Labs  Lab 05/07/20 2231 05/08/20 2242 05/09/20 0439  WBC 7.3 10.9* 11.0*  NEUTROABS 6.1  --   --   HGB 6.7* 7.5* 8.2*  HCT 22.0* 24.3* 26.4*  MCV 73.8* 77.9* 78.3*  PLT 357 341 097    Basic Metabolic Panel: Recent Labs  Lab 05/07/20 2231 05/08/20 2242 05/09/20 0439  NA 143 139 141  K 2.4* 2.8* 3.0*  CL 97* 100 101  CO2 31 27 27   GLUCOSE 135* 113* 102*  BUN 20 19 19   CREATININE 2.80* 2.94* 2.87*  CALCIUM 8.7* 8.4* 8.7*  MG  --  2.1 2.3    GFR: Estimated Creatinine Clearance: 21.2 mL/min (A) (by C-G formula based on SCr  of 2.87 mg/dL (H)).   Coagulation Profile: Recent Labs  Lab 05/08/20 0002  INR 1.2      Recent Results (from the past 240 hour(s))  Urine culture     Status: Abnormal   Collection Time: 05/08/20 12:02 AM   Specimen: Urine, Random  Result Value Ref Range Status   Specimen Description   Final    URINE, RANDOM Performed at [facility] [address] [place], [place] [address]    Special Requests   Final    NONE Performed at [facility], [place] [address], [place], [place] [address]    Culture MULTIPLE SPECIES PRESENT, SUGGEST RECOLLECTION (A)  Final   Report Status 05/08/2020 FINAL  Final  Resp Panel by RT-PCR (Flu A&B, Covid) Nasopharyngeal Swab     Status: None   Collection Time: 05/08/20 12:38 AM   Specimen: Nasopharyngeal Swab; Nasopharyngeal(NP) swabs in vial transport medium  Result Value Ref Range Status   SARS Coronavirus 2 by RT PCR NEGATIVE NEGATIVE Final    Comment: (NOTE) SARS-CoV-2 target nucleic acids are NOT DETECTED.  The  SARS-CoV-2 RNA is generally detectable in upper respiratory specimens during the acute phase of infection. The lowest concentration of SARS-CoV-2 viral copies this assay can detect is 138 copies/mL. A negative result does not preclude SARS-Cov-2 infection and should not be used as the sole basis for treatment or other patient management decisions. A negative result may occur with  improper specimen collection/handling, submission of specimen other than nasopharyngeal swab, presence of viral mutation(s) within the areas targeted by this assay, and inadequate number of viral copies(<138 copies/mL). A negative result must be combined with clinical observations, patient history, and epidemiological information. The expected result is Negative.  Fact Sheet for Patients:  EntrepreneurPulse.com.au  Fact Sheet for Healthcare Providers:  IncredibleEmployment.be  This test is no t yet approved or cleared by the [place] FDA and  has been authorized for detection and/or diagnosis of SARS-CoV-2 by FDA under an Emergency Use Authorization (EUA). This EUA will remain  in effect (meaning this test can be used) for the duration of the COVID-19 declaration under Section 564(b)(1) of the Act, 21 U.S.C.section 360bbb-3(b)(1), unless the authorization is terminated  or revoked sooner.       Influenza A by PCR NEGATIVE NEGATIVE Final   Influenza B by PCR NEGATIVE NEGATIVE Final    Comment: (NOTE) The Xpert Xpress SARS-CoV-2/FLU/RSV plus assay is intended as an aid in the diagnosis of influenza from Nasopharyngeal swab specimens and should not be used as a sole basis for treatment. Nasal washings and aspirates are unacceptable for Xpert Xpress SARS-CoV-2/FLU/RSV testing.  Fact Sheet for Patients: EntrepreneurPulse.com.au  Fact Sheet for Healthcare Providers: IncredibleEmployment.be  This test is not yet approved or  cleared by the [place] FDA and has been authorized for detection and/or diagnosis of SARS-CoV-2 by FDA under an Emergency Use Authorization (EUA). This EUA will remain in effect (meaning this test can be used) for the duration of the COVID-19 declaration under Section 564(b)(1) of the Act, 21 U.S.C. section 360bbb-3(b)(1), unless the authorization is terminated or revoked.  Performed at [facility], [place] [address], [place], [place] [address]       Radiology Studies: US RENAL  Result Date: 05/08/2020 CLINICAL DATA:  Acute kidney injury. History of hypertension and diabetes. EXAM: RENAL / URINARY TRACT ULTRASOUND COMPLETE COMPARISON:  None. FINDINGS: Right Kidney: Renal measurements: 13.5 x 6 x 7.6 cm = volume: 320  mL. Moderate right hydronephrosis. Mild diffuse parenchymal thinning. No mass lesions. Left Kidney: Renal measurements: 15.7 x 6.9 x 7.6 cm = volume: 425 mL. Moderate left renal hydronephrosis. Mild diffuse parenchymal thinning. No focal mass lesions. Bladder: The bladder is decompressed around a large heterogeneous mass lesion. The mass measures about 11.4 x 7 x 9 cm. This could represent an intrinsic bladder mass or polyp, or a large intraluminal blood clot. Correlate with urinalysis. Cystoscopy may be useful for further evaluation. Other: None. IMPRESSION: 1. Bilateral renal parenchymal thinning with moderate bilateral hydronephrosis. 2. Large heterogeneous mass in the bladder. Consider polyp, blood clot, or other mass. Cystoscopy may be useful for further evaluation. Electronically Signed   By: Lucienne Capers M.D.   On: 05/08/2020 06:38       LOS: 1 day   [facility] Pager on [email or web address]  05/09/2020, 9:22 AM

## 2020-05-09 NOTE — Plan of Care (Signed)
Patient has been very impulsive today. She has been redirected several times about getting up alone and she continues to jump up and go into the bathroom. Has been told several times that she needs to wear a feminine pad to catch all the blood and she is non complaint. Safety maintained, will continue to monitor.  Problem: Education: Goal: Knowledge of General Education information will improve Description: Including pain rating scale, medication(s)/side effects and non-pharmacologic comfort measures Outcome: Not Progressing   Problem: Health Behavior/Discharge Planning: Goal: Ability to manage health-related needs will improve Outcome: Not Progressing   Problem: Clinical Measurements: Goal: Ability to maintain clinical measurements within normal limits will improve Outcome: Not Progressing Goal: Will remain free from infection Outcome: Not Progressing Goal: Diagnostic test results will improve Outcome: Not Progressing Goal: Respiratory complications will improve Outcome: Not Progressing Goal: Cardiovascular complication will be avoided Outcome: Not Progressing   Problem: Activity: Goal: Risk for activity intolerance will decrease Outcome: Not Progressing   Problem: Nutrition: Goal: Adequate nutrition will be maintained Outcome: Not Progressing   Problem: Coping: Goal: Level of anxiety will decrease Outcome: Not Progressing   Problem: Elimination: Goal: Will not experience complications related to bowel motility Outcome: Not Progressing Goal: Will not experience complications related to urinary retention Outcome: Not Progressing   Problem: Pain Managment: Goal: General experience of comfort will improve Outcome: Not Progressing   Problem: Safety: Goal: Ability to remain free from injury will improve Outcome: Not Progressing   Problem: Skin Integrity: Goal: Risk for impaired skin integrity will decrease Outcome: Not Progressing

## 2020-05-10 ENCOUNTER — Ambulatory Visit: Payer: Medicare Other

## 2020-05-10 DIAGNOSIS — N179 Acute kidney failure, unspecified: Secondary | ICD-10-CM | POA: Diagnosis not present

## 2020-05-10 NOTE — Progress Notes (Signed)
Palliative Care Progress Note  Latoya Green is much weaker appearing today. She is pale and much more lethargic. She continues to have significant vaginal bleeding, continuous, now with large clots and worsening pelvic pain.  Her Aunt visited this AM. The visit was short by patient's choice and she was apparently writing checks to settle her affairs. She is oriented to person and place, she is able to tell me she has terminal cancer. She says "its been a glorious life". She continues to have itching and dry skin especially on her scalp. She tells me it is "African Skin Disease unknown in the [place]". She is more directable today and is not getting up without assistance.  I discussed hospice with her directly and she is agreeable. She continue to refuse interventions or treatments for her cancer -even those specifically for management of her bleeding and symptoms.   Suspect her Hb will continue to fall precipitously. Prognosis <2 weeks. Will place referral for hospice [facility] facility. She will need 24/7 care to manage bleeding and for pain and symptom management.  Continue current pain medication and sedation prn.  Lane Hacker, DO Palliative Medicine  Time: 25 minutes Greater than 50%  of this time was spent counseling and coordinating care related to the above assessment and plan.

## 2020-05-10 NOTE — Progress Notes (Signed)
PROGRESS NOTE    Latoya Green  SNK:[id number] DOB: 09-Sep-1955 DOA: 05/07/2020 PCP: Caren Macadam, MD   Chief Complaint  Patient presents with  . Vaginal Bleeding    Brief Narrative:   64 y.o. 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 Patient has been having ongoing intermittent vaginal bleeding and hematuria secondary to her invasive cervical cancer.   Patient has been seen by oncology, Dr. Alvy Bimler, and radiation/oncology Dr. Sondra Come.  She was planned to start chemotherapy and radiation however MRI pelvis 03/21/2020 showed that the uterine cervix mass was invading the bladder as well as bilateral hydroureteronephrosis.  She was seen by urology, Dr. Tresa Moore, with plan for cystoscopy with retrograde pyelogram/ureteral stent placement before any further therapy could be initiated however patient did not keep her procedure appointment.  Patient mentioned that she did not want to pursue any invasive procedures chemotherapy or radiation.  She is interested in hospice services.  Assessment & Plan:   Principal Problem:   AKI (acute kidney injury) ([place]) Active Problems:   Malignant neoplasm of exocervix ([place])   Cancer associated pain   Hypokalemia   Acute on chronic blood loss anemia   Type 2 diabetes mellitus ([place])   Hypertension associated with diabetes ([place])   Hyperlipidemia associated with type 2 diabetes mellitus ([place])   Goals of care, counseling/discussion   Cervical cancer ([place])  Goals of care: palliative care following, planning for inpatient hospice. Comfort measures, appreciate palliative care assistance  Acute kidney injury likely due to obstruction/hypokalemia Presented with Chara Marquard creatinine of 2.8.  Creatinine was normal Mahlia Fernando month ago.  This is most likely related to her invasive cervical cancer involving the  urinary bladder.  She has known bilateral hydroureteronephrosis.  Apparently cystoscopy and stent was planned however patient did not keep that appointment.  It looks based on oncology notes patient has not been following up with oncologist as well.  Renal ultrasound was done which showed bilateral hydronephrosis and Pearlee Arvizu mass in the urinary bladder.  Patient mentioned that she did not want any further testing or treatment for her cancer. As noted above, comfort measures only  Acute on chronic blood loss anemia This is likely secondary to her vaginal bleeding from the cervical cancer.  Noted to be actively bleeding.  Hemoglobin was 6.7.  She was transfused 1 unit of PRBC.   Hemoglobin noted to be 8.2. Comfort measures as above   Invasive squamous cell carcinoma of the cervix The tumor apparently involves her urinary bladder causing bilateral hydronephrosis.  Patient followed by medical oncology, Dr. Alvy Bimler, radiation oncology Dr. Sondra Come.  Dr. Tammi Klippel from urology was also involed.  Patient mentioned to the admitting doctor that she did not want any chemotherapy radiation for invasive procedures.  Palliative care has been consulted.  She is DNR.  Pain control as needed.  We will try and discuss with GYN oncology to see if there is any therapeutic option available to try and control her bleeding. Discussed with GYN oncology (Dr. Berline Lopes) who will try to talk to the patient as the only options to stop the bleeding would be either radiation treatment or embolization or combination of both.  Patient previously has mentioned that she is unwilling to consider any invasive procedures or treatment.   Diabetes mellitus type 2 Holding Metformin. SSI.  Monitor CBGs   Essential hypertension Holding lisinopril due to  renal failure.  Occasional high blood pressure readings noted.  Continue to monitor.   Hyperlipidemia Continue statin.   History of depression and anxiety Continue sertraline.  DVT prophylaxis:  none, comfort measures Code Status: DNR Family Communication: aunt at bedside Disposition:   Status is: Inpatient  Remains inpatient appropriate because:Inpatient level of care appropriate due to severity of illness   Dispo: The patient is from: Home              Anticipated d/c is to: hospice              Anticipated d/c date is: 1 day              Patient currently is not medically stable to d/c.   Consultants:   Palliative care  Gyn onc  Procedures:  none  Antimicrobials Anti-infectives (From admission, onward)   None     Subjective: "I don't know anything else except that i'm dying" She expresses interest in hospice, not interested in therapy for her cancer  Objective: Vitals:   05/09/20 1204 05/09/20 1900 05/09/20 2007 05/10/20 1528  BP: (!) 157/65  (!) 158/86 135/77  Pulse: 92  86 86  Resp: 16 (!) 22 20 18   Temp: 99.3 F (37.4 C)  98.4 F (36.9 C) 99.7 F (37.6 C)  TempSrc: Oral  Oral Oral  SpO2: 97%  98% 95%  Weight:      Height:        Intake/Output Summary (Last 24 hours) at 05/10/2020 1533 Last data filed at 05/10/2020 1000 Gross per 24 hour  Intake 460 ml  Output --  Net 460 ml   Filed Weights   05/07/20 2042 05/08/20 0311 05/08/20 0525  Weight: 77.1 kg 77.1 kg 77.1 kg    Examination:  Limited exam with focus on comofrt General exam: Appears calm and comfortable, pale Respiratory system: unlabored Cardiovascular system: RRR Gastrointestinal system: nondistended Central nervous system: Alert. No focal neurological deficits. Extremities: no LEE  Data Reviewed: I have personally reviewed following labs and imaging studies  CBC: Recent Labs  Lab 05/07/20 2231 05/08/20 2242 05/09/20 0439  WBC 7.3 10.9* 11.0*  NEUTROABS 6.1  --   --   HGB 6.7* 7.5* 8.2*  HCT 22.0* 24.3* 26.4*  MCV 73.8* 77.9* 78.3*  PLT 357 341 947    Basic Metabolic Panel: Recent Labs  Lab 05/07/20 2231 05/08/20 2242 05/09/20 0439  NA 143 139 141  K  2.4* 2.8* 3.0*  CL 97* 100 101  CO2 31 27 27   GLUCOSE 135* 113* 102*  BUN 20 19 19   CREATININE 2.80* 2.94* 2.87*  CALCIUM 8.7* 8.4* 8.7*  MG  --  2.1 2.3    GFR: Estimated Creatinine Clearance: 21.2 mL/min (Ary Rudnick) (by C-G formula based on SCr of 2.87 mg/dL (H)).  Liver Function Tests: Recent Labs  Lab 05/09/20 0439  AST 17  ALT 10  ALKPHOS 62  BILITOT 0.5  PROT 6.8  ALBUMIN 3.4*    CBG: Recent Labs  Lab 05/08/20 1701 05/08/20 2041 05/09/20 0742 05/09/20 1200 05/09/20 1703  GLUCAP 120* 108* 83 115* 120*     Recent Results (from the past 240 hour(s))  Urine culture     Status: Abnormal   Collection Time: 05/08/20 12:02 AM   Specimen: Urine, Random  Result Value Ref Range Status   Specimen Description   Final    URINE, RANDOM Performed at [facility], [place] [address], [place], [place] [address]  Special Requests   Final    NONE Performed at [facility], [place] [address], [place], [place] [address]    Culture MULTIPLE SPECIES PRESENT, SUGGEST RECOLLECTION (Anitta Tenny)  Final   Report Status 05/08/2020 FINAL  Final  Resp Panel by RT-PCR (Flu Keenon Leitzel&B, Covid) Nasopharyngeal Swab     Status: None   Collection Time: 05/08/20 12:38 AM   Specimen: Nasopharyngeal Swab; Nasopharyngeal(NP) swabs in vial transport medium  Result Value Ref Range Status   SARS Coronavirus 2 by RT PCR NEGATIVE NEGATIVE Final    Comment: (NOTE) SARS-CoV-2 target nucleic acids are NOT DETECTED.  The SARS-CoV-2 RNA is generally detectable in upper respiratory specimens during the acute phase of infection. The lowest concentration of SARS-CoV-2 viral copies this assay can detect is 138 copies/mL. Evyn Kooyman negative result does not preclude SARS-Cov-2 infection and should not be used as the sole basis for treatment or other patient management decisions. Ayoub Arey negative result may occur with  improper specimen collection/handling, submission of specimen other than nasopharyngeal  swab, presence of viral mutation(s) within the areas targeted by this assay, and inadequate number of viral copies(<138 copies/mL). Tahra Hitzeman negative result must be combined with clinical observations, patient history, and epidemiological information. The expected result is Negative.  Fact Sheet for Patients:  EntrepreneurPulse.com.au  Fact Sheet for Healthcare Providers:  IncredibleEmployment.be  This test is no t yet approved or cleared by the [place] FDA and  has been authorized for detection and/or diagnosis of SARS-CoV-2 by FDA under an Emergency Use Authorization (EUA). This EUA will remain  in effect (meaning this test can be used) for the duration of the COVID-19 declaration under Section 564(b)(1) of the Act, 21 U.S.C.section 360bbb-3(b)(1), unless the authorization is terminated  or revoked sooner.       Influenza Ronold Hardgrove by PCR NEGATIVE NEGATIVE Final   Influenza B by PCR NEGATIVE NEGATIVE Final    Comment: (NOTE) The Xpert Xpress SARS-CoV-2/FLU/RSV plus assay is intended as an aid in the diagnosis of influenza from Nasopharyngeal swab specimens and should not be used as Aliceson Dolbow sole basis for treatment. Nasal washings and aspirates are unacceptable for Xpert Xpress SARS-CoV-2/FLU/RSV testing.  Fact Sheet for Patients: EntrepreneurPulse.com.au  Fact Sheet for Healthcare Providers: IncredibleEmployment.be  This test is not yet approved or cleared by the [place] FDA and has been authorized for detection and/or diagnosis of SARS-CoV-2 by FDA under an Emergency Use Authorization (EUA). This EUA will remain in effect (meaning this test can be used) for the duration of the COVID-19 declaration under Section 564(b)(1) of the Act, 21 U.S.C. section 360bbb-3(b)(1), unless the authorization is terminated or revoked.  Performed at [facility] [place] [address] [place], [place] [address]            Radiology Studies: No results found.      Scheduled Meds: . sodium chloride   Intravenous Once  . carbamazepine  200 mg Oral Daily  . sertraline  100 mg Oral Daily   Continuous Infusions:    LOS: 2 days    Time spent: over 30 min    Fayrene Helper, MD [facility]   To contact the attending provider between 7A-7P or the covering provider during after hours 7P-7A, please log into the web site [email or web address] and access using universal [place] password for that web site. If you do not have the password, please call the hospital operator.  05/10/2020, 3:33 PM

## 2020-05-11 DIAGNOSIS — N179 Acute kidney failure, unspecified: Secondary | ICD-10-CM | POA: Diagnosis not present

## 2020-05-11 NOTE — Progress Notes (Signed)
Manufacturing engineer [facility])  Referral received for residential hospice at [facility].  [facility] does not have any bed availability today.  [facility] will update TOC and family once bed status changes.  Thank you, Venia Carbon RN, BSN, [facility] Liaison

## 2020-05-11 NOTE — TOC Progression Note (Signed)
Transition of Care [facility]) - Progression Note    Patient Details  Name: Latoya Green MRN: [id number] Date of Birth: May 08, 1956  Transition of Care [facility]) CM/SW Contact  Ross Ludwig, [place] Phone Number: 05/11/2020, 5:01 PM  Clinical Narrative:     CSW spoke to patient and provided choice for hospice facility, she chose [facility].  CSW contacted [facility], they do not have any beds available today, they will let CSW know when bed available.  Patient expressed understanding and did not want to go to a different facility.    Expected Discharge Plan and [place] once bed is available.                                               Social Determinants of Health (SDOH) Interventions    Readmission Risk Interventions No flowsheet data found.

## 2020-05-11 NOTE — Progress Notes (Signed)
PROGRESS NOTE    Latoya Green  AOZ:[id number] DOB: 03/16/1956 DOA: 05/07/2020 PCP: Caren Macadam, MD   Chief Complaint  Patient presents with  . Vaginal Bleeding    Brief Narrative:   64 y.o. 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 Patient has been having ongoing intermittent vaginal bleeding and hematuria secondary to her invasive cervical cancer.   Patient has been seen by oncology, Dr. Alvy Bimler, and radiation/oncology Dr. Sondra Come.  She was planned to start chemotherapy and radiation however MRI pelvis 03/21/2020 showed that the uterine cervix Green was invading the Green as well as bilateral hydroureteronephrosis.  She was seen by urology, Dr. Tresa Moore, with plan for cystoscopy with retrograde pyelogram/ureteral stent placement before any further therapy could be initiated however patient did not keep her procedure appointment.  Patient mentioned that she did not want to pursue any invasive procedures chemotherapy or radiation.  She is interested in hospice services.  Assessment & Plan:   Principal Problem:   AKI (acute kidney injury) ([place]) Active Problems:   Malignant neoplasm of exocervix ([place])   Cancer associated pain   Hypokalemia   Acute on chronic blood loss anemia   Type 2 diabetes mellitus ([place])   Hypertension associated with diabetes ([place])   Hyperlipidemia associated with type 2 diabetes mellitus ([place])   Goals of care, counseling/discussion   Cervical cancer ([place])  Goals of care: palliative care following, planning for inpatient hospice. Comfort measures, appreciate palliative care assistance  Acute kidney injury likely due to obstruction/hypokalemia Presented with Latoya Green creatinine of 2.8.  Creatinine was normal Latoya Green month ago.  This is most likely related to her invasive cervical cancer involving the  urinary Green.  She has known bilateral hydroureteronephrosis.  Apparently cystoscopy and stent was planned however patient did not keep that appointment.  It looks based on oncology notes patient has not been following up with oncologist as well.  Renal ultrasound was done which showed bilateral hydronephrosis and Latoya Green.  Patient mentioned that she did not want any further testing or treatment for her cancer. As noted above, comfort measures only  Acute on chronic blood loss anemia This is likely secondary to her vaginal bleeding from the cervical cancer.  Noted to be actively bleeding.  Hemoglobin was 6.7.  She was transfused 1 unit of PRBC.   Hemoglobin noted to be 8.2. Comfort measures as above   Invasive squamous cell carcinoma of the cervix The tumor apparently involves her urinary Green causing bilateral hydronephrosis.  Patient followed by medical oncology, Dr. Alvy Bimler, radiation oncology Dr. Sondra Come.  Dr. Tammi Klippel from urology was also involed.  Patient mentioned to the admitting doctor that she did not want any chemotherapy radiation for invasive procedures.  Palliative care has been consulted.  She is DNR.  Pain control as needed.  We will try and discuss with GYN oncology to see if there is any therapeutic option available to try and control her bleeding. Discussed with GYN oncology (Dr. Berline Lopes) who will try to talk to the patient as the only options to stop the bleeding would be either radiation treatment or embolization or combination of both.  Patient previously has mentioned that she is unwilling to consider any invasive procedures or treatment.   Diabetes mellitus type 2 Holding Metformin. SSI.  Monitor CBGs   Essential hypertension Holding lisinopril due to  renal failure.  Occasional high blood pressure readings noted.  Continue to monitor.   Hyperlipidemia Continue statin.   History of depression and anxiety Continue sertraline.  DVT prophylaxis:  none, comfort measures Code Status: DNR Family Communication: aunt at bedside Disposition:   Status is: Inpatient  Remains inpatient appropriate because:Inpatient level of care appropriate due to severity of illness   Dispo: The patient is from: Home              Anticipated d/c is to: hospice              Anticipated d/c date is: 1 day              Patient currently is not medically stable to d/c.   Consultants:   Palliative care  Gyn onc  Procedures:  none  Antimicrobials Anti-infectives (From admission, onward)   None     Subjective: No new complaints Asking when she might go to hospice  Objective: Vitals:   05/09/20 1900 05/09/20 2007 05/10/20 1528 05/10/20 2038  BP:  (!) 158/86 135/77 (!) 160/87  Pulse:  86 86 92  Resp: (!) 22 20 18 20   Temp:  98.4 F (36.9 C) 99.7 F (37.6 C) 98 F (36.7 C)  TempSrc:  Oral Oral Axillary  SpO2:  98% 95% 99%  Weight:      Height:        Intake/Output Summary (Last 24 hours) at 05/11/2020 1319 Last data filed at 05/10/2020 2200 Gross per 24 hour  Intake 236 ml  Output --  Net 236 ml   Filed Weights   05/07/20 2042 05/08/20 0311 05/08/20 0525  Weight: 77.1 kg 77.1 kg 77.1 kg    Examination:  Limited exam with focus on comofrt General: No acute distress. Lungs: unlabored Neurological: Alert and oriented 3. Moves all extremities 4 Cranial nerves II through XII grossly intact. Skin: pale Extremities: No clubbing or cyanosis. No edema.   Data Reviewed: I have personally reviewed following labs and imaging studies  CBC: Recent Labs  Lab 05/07/20 2231 05/08/20 2242 05/09/20 0439  WBC 7.3 10.9* 11.0*  NEUTROABS 6.1  --   --   HGB 6.7* 7.5* 8.2*  HCT 22.0* 24.3* 26.4*  MCV 73.8* 77.9* 78.3*  PLT 357 341 130    Basic Metabolic Panel: Recent Labs  Lab 05/07/20 2231 05/08/20 2242 05/09/20 0439  NA 143 139 141  K 2.4* 2.8* 3.0*  CL 97* 100 101  CO2 31 27 27   GLUCOSE 135* 113* 102*  BUN 20 19 19    CREATININE 2.80* 2.94* 2.87*  CALCIUM 8.7* 8.4* 8.7*  MG  --  2.1 2.3    GFR: Estimated Creatinine Clearance: 21.2 mL/min (Latoya Green) (by C-G formula based on SCr of 2.87 mg/dL (H)).  Liver Function Tests: Recent Labs  Lab 05/09/20 0439  AST 17  ALT 10  ALKPHOS 62  BILITOT 0.5  PROT 6.8  ALBUMIN 3.4*    CBG: Recent Labs  Lab 05/08/20 1701 05/08/20 2041 05/09/20 0742 05/09/20 1200 05/09/20 1703  GLUCAP 120* 108* 83 115* 120*     Recent Results (from the past 240 hour(s))  Urine culture     Status: Abnormal   Collection Time: 05/08/20 12:02 AM   Specimen: Urine, Random  Result Value Ref Range Status   Specimen Description   Final    URINE, RANDOM Performed at [facility], [place] [address], [place], [place] [address]    Special Requests   Final  NONE Performed at [facility], [place] [address], [place], [place] [address]    Culture MULTIPLE SPECIES PRESENT, SUGGEST RECOLLECTION (Dajha Urquilla)  Final   Report Status 05/08/2020 FINAL  Final  Resp Panel by RT-PCR (Flu Francella Barnett&B, Covid) Nasopharyngeal Swab     Status: None   Collection Time: 05/08/20 12:38 AM   Specimen: Nasopharyngeal Swab; Nasopharyngeal(NP) swabs in vial transport medium  Result Value Ref Range Status   SARS Coronavirus 2 by RT PCR NEGATIVE NEGATIVE Final    Comment: (NOTE) SARS-CoV-2 target nucleic acids are NOT DETECTED.  The SARS-CoV-2 RNA is generally detectable in upper respiratory specimens during the acute phase of infection. The lowest concentration of SARS-CoV-2 viral copies this assay can detect is 138 copies/mL. Pansie Guggisberg negative result does not preclude SARS-Cov-2 infection and should not be used as the sole basis for treatment or other patient management decisions. Promyse Ardito negative result may occur with  improper specimen collection/handling, submission of specimen other than nasopharyngeal swab, presence of viral mutation(s) within the areas targeted by this assay, and  inadequate number of viral copies(<138 copies/mL). Hayla Hinger negative result must be combined with clinical observations, patient history, and epidemiological information. The expected result is Negative.  Fact Sheet for Patients:  EntrepreneurPulse.com.au  Fact Sheet for Healthcare Providers:  IncredibleEmployment.be  This test is no t yet approved or cleared by the [place] FDA and  has been authorized for detection and/or diagnosis of SARS-CoV-2 by FDA under an Emergency Use Authorization (EUA). This EUA will remain  in effect (meaning this test can be used) for the duration of the COVID-19 declaration under Section 564(b)(1) of the Act, 21 U.S.C.section 360bbb-3(b)(1), unless the authorization is terminated  or revoked sooner.       Influenza Salvator Seppala by PCR NEGATIVE NEGATIVE Final   Influenza B by PCR NEGATIVE NEGATIVE Final    Comment: (NOTE) The Xpert Xpress SARS-CoV-2/FLU/RSV plus assay is intended as an aid in the diagnosis of influenza from Nasopharyngeal swab specimens and should not be used as Gavyn Ybarra sole basis for treatment. Nasal washings and aspirates are unacceptable for Xpert Xpress SARS-CoV-2/FLU/RSV testing.  Fact Sheet for Patients: EntrepreneurPulse.com.au  Fact Sheet for Healthcare Providers: IncredibleEmployment.be  This test is not yet approved or cleared by the [place] FDA and has been authorized for detection and/or diagnosis of SARS-CoV-2 by FDA under an Emergency Use Authorization (EUA). This EUA will remain in effect (meaning this test can be used) for the duration of the COVID-19 declaration under Section 564(b)(1) of the Act, 21 U.S.C. section 360bbb-3(b)(1), unless the authorization is terminated or revoked.  Performed at [facility], [place] [address], [place], [place] [address]          Radiology Studies: No results found.      Scheduled Meds: .  sodium chloride   Intravenous Once  . carbamazepine  200 mg Oral Daily  . sertraline  100 mg Oral Daily   Continuous Infusions:   LOS: 3 days    Time spent: over 30 min    Fayrene Helper, MD [facility]   To contact the attending provider between 7A-7P or the covering provider during after hours 7P-7A, please log into the web site [email or web address] and access using universal [place] password for that web site. If you do not have the password, please call the hospital operator.  05/11/2020, 1:19 PM

## 2020-05-12 DIAGNOSIS — N179 Acute kidney failure, unspecified: Secondary | ICD-10-CM | POA: Diagnosis not present

## 2020-05-12 LAB — BPAM RBC
Blood Product Expiration Date: 202112092359
Blood Product Expiration Date: 202112092359
ISSUE DATE / TIME: 202111221359
Unit Type and Rh: 6200
Unit Type and Rh: 6200

## 2020-05-12 LAB — TYPE AND SCREEN
ABO/RH(D): A POS
Antibody Screen: NEGATIVE
Unit division: 0
Unit division: 0

## 2020-05-12 MED ORDER — POLYETHYLENE GLYCOL 3350 17 G PO PACK
17.0000 g | PACK | Freq: Two times a day (BID) | ORAL | Status: DC
  Administered 2020-05-12 – 2020-05-13 (×2): 17 g via ORAL
  Filled 2020-05-12 (×2): qty 1

## 2020-05-12 NOTE — Consult Note (Signed)
[facility] [place]: Referral received for [facility]. Pt has been approved for GIP level of care by Dr. Beverlyn Roux. SW Randall Hiss made aware that pt has been approved if she is willing to come to [facility]. He will discuss and get back to RN if in agreement. Please call with any questions or needs. [place], [place] [phone]

## 2020-05-12 NOTE — Progress Notes (Signed)
Palliative Care Progress Note  Ms. Wachs is resting peacefully, she is having more pelvic pain and using more PRN doses of hydromorphone and Ativan. Her bleeding is now just intermittent and she is passing clots vs. Continuous bleeding. She appears pale and is weaker each day. Noted notes about agitation re: smoking-ok to place a nicotine patch for her comfort and to prevent withdrawal-also appropriate to use Ativan. I have not been able to reach her Aunt since my initial consult or following her visit earlier this week. Recommend continuing PRN comfort medications for now. She remains appropriate for a hospice IPU. She has little or no support system and lived alone prior to admission. She has a son who has "mental retardation" who lives in an adult care home- I asked her if she felt like he was taken care and she said yes but did not offer additional details or express concerns. She appears to remain in acceptance of plans for hospice IPU.  Lane Hacker, DO Palliative Medicine  Time: 20 min Greater than 50%  of this time was spent counseling and coordinating care related to the above assessment and plan.

## 2020-05-12 NOTE — Progress Notes (Signed)
Sleeping without distress noted.

## 2020-05-12 NOTE — Progress Notes (Signed)
PROGRESS NOTE    Latoya Green  WGY:[id number] DOB: 07/17/1955 DOA: 05/07/2020 PCP: Caren Macadam, MD   Chief Complaint  Patient presents with  . Vaginal Bleeding    Brief Narrative:   64 y.o. 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 Patient has been having ongoing intermittent vaginal bleeding and hematuria secondary to her invasive cervical cancer.   Patient has been seen by oncology, Dr. Alvy Bimler, and radiation/oncology Dr. Sondra Come.  She was planned to start chemotherapy and radiation however MRI pelvis 03/21/2020 showed that the uterine cervix mass was invading the bladder as well as bilateral hydroureteronephrosis.  She was seen by urology, Dr. Tresa Moore, with plan for cystoscopy with retrograde pyelogram/ureteral stent placement before any further therapy could be initiated however patient did not keep her procedure appointment.  Patient mentioned that she did not want to pursue any invasive procedures chemotherapy or radiation.  She is interested in hospice services.  Assessment & Plan:   Principal Problem:   AKI (acute kidney injury) ([place]) Active Problems:   Malignant neoplasm of exocervix ([place])   Cancer associated pain   Hypokalemia   Acute on chronic blood loss anemia   Type 2 diabetes mellitus ([place])   Hypertension associated with diabetes ([place])   Hyperlipidemia associated with type 2 diabetes mellitus ([place])   Goals of care, counseling/discussion   Cervical cancer ([place])  Goals of care: palliative care following, planning for inpatient hospice. Comfort measures, appreciate palliative care assistance  Acute kidney injury likely due to obstruction/hypokalemia Presented with Latoya Green creatinine of 2.8.  Creatinine was normal Latoya Green month ago.  This is most likely related to her invasive cervical cancer involving the  urinary bladder.  She has known bilateral hydroureteronephrosis.  Apparently cystoscopy and stent was planned however patient did not keep that appointment.  It looks based on oncology notes patient has not been following up with oncologist as well.  Renal ultrasound was done which showed bilateral hydronephrosis and Latoya Green mass in the urinary bladder.  Patient mentioned that she did not want any further testing or treatment for her cancer. As noted above, comfort measures only  Acute on chronic blood loss anemia This is likely secondary to her vaginal bleeding from the cervical cancer.  Was bleeding at presentation, but this seems to have slowed. Hemoglobin was 6.7.  She was transfused 1 unit of PRBC.   Hemoglobin noted to be 8.2. Comfort measures as above   Invasive squamous cell carcinoma of the cervix The tumor apparently involves her urinary bladder causing bilateral hydronephrosis.  Patient followed by medical oncology, Dr. Alvy Bimler, radiation oncology Dr. Sondra Come.  Dr. Tammi Klippel from urology was also involed.  Patient mentioned to the admitting doctor that she did not want any chemotherapy radiation for invasive procedures.  Palliative care has been consulted.  She is DNR.  Pain control as needed.  We will try and discuss with GYN oncology to see if there is any therapeutic option available to try and control her bleeding. Discussed with GYN oncology (Dr. Berline Lopes) who will try to talk to the patient as the only options to stop the bleeding would be either radiation treatment or embolization or combination of both.  Patient previously has mentioned that she is unwilling to consider any invasive procedures or treatment.   Diabetes mellitus type 2 Holding Metformin. SSI.  Monitor CBGs   Essential hypertension  Holding lisinopril due to renal failure.  Occasional high blood pressure readings noted.  Continue to monitor.   Hyperlipidemia Continue statin.   History of depression and anxiety Continue  sertraline.  DVT prophylaxis: none, comfort measures Code Status: DNR Family Communication: aunt over phone 11/26 Disposition:   Status is: Inpatient  Remains inpatient appropriate because:Inpatient level of care appropriate due to severity of illness   Dispo: The patient is from: Home              Anticipated d/c is to: hospice              Anticipated d/c date is: 1 day              Patient currently is not medically stable to d/c.   Consultants:   Palliative care  Gyn onc  Procedures:  none  Antimicrobials Anti-infectives (From admission, onward)   None     Subjective: Constipation No other complaints  Objective: Vitals:   05/11/20 1421 05/11/20 2110 05/11/20 2145 05/12/20 0511  BP: 132/62 (!) 185/100 (!) 150/78 (!) 176/91  Pulse: 83 83 88 92  Resp: 18 18 18 18   Temp: 99.1 F (37.3 C) 98.5 F (36.9 C)    TempSrc: Oral Oral    SpO2: 97% 95% 96% 91%  Weight:      Height:        Intake/Output Summary (Last 24 hours) at 05/12/2020 1004 Last data filed at 05/12/2020 0600 Gross per 24 hour  Intake 620 ml  Output --  Net 620 ml   Filed Weights   05/07/20 2042 05/08/20 0311 05/08/20 0525  Weight: 77.1 kg 77.1 kg 77.1 kg    Examination:  Limited exam with focus on comofrt General: NAD Lungs: unlabored Neurological: alert and oriented, no focal neuro deficits Skin: pale, warm and dry Extremities: no lEE  Data Reviewed: I have personally reviewed following labs and imaging studies  CBC: Recent Labs  Lab 05/07/20 2231 05/08/20 2242 05/09/20 0439  WBC 7.3 10.9* 11.0*  NEUTROABS 6.1  --   --   HGB 6.7* 7.5* 8.2*  HCT 22.0* 24.3* 26.4*  MCV 73.8* 77.9* 78.3*  PLT 357 341 053    Basic Metabolic Panel: Recent Labs  Lab 05/07/20 2231 05/08/20 2242 05/09/20 0439  NA 143 139 141  K 2.4* 2.8* 3.0*  CL 97* 100 101  CO2 31 27 27   GLUCOSE 135* 113* 102*  BUN 20 19 19   CREATININE 2.80* 2.94* 2.87*  CALCIUM 8.7* 8.4* 8.7*  MG  --  2.1 2.3     GFR: Estimated Creatinine Clearance: 21.2 mL/min (Latoya Green) (by C-G formula based on SCr of 2.87 mg/dL (H)).  Liver Function Tests: Recent Labs  Lab 05/09/20 0439  AST 17  ALT 10  ALKPHOS 62  BILITOT 0.5  PROT 6.8  ALBUMIN 3.4*    CBG: Recent Labs  Lab 05/08/20 1701 05/08/20 2041 05/09/20 0742 05/09/20 1200 05/09/20 1703  GLUCAP 120* 108* 83 115* 120*     Recent Results (from the past 240 hour(s))  Urine culture     Status: Abnormal   Collection Time: 05/08/20 12:02 AM   Specimen: Urine, Random  Result Value Ref Range Status   Specimen Description   Final    URINE, RANDOM Performed at [facility], [place] [address] [place],  [address]    Special Requests   Final    NONE Performed at [facility], [address] [place], [place]  [address]    Culture MULTIPLE SPECIES PRESENT, SUGGEST RECOLLECTION (Fabrizio Filip)  Final   Report Status 05/08/2020 FINAL  Final  Resp Panel by RT-PCR (Flu Milta Croson&B, Covid) Nasopharyngeal Swab     Status: None   Collection Time: 05/08/20 12:38 AM   Specimen: Nasopharyngeal Swab; Nasopharyngeal(NP) swabs in vial transport medium  Result Value Ref Range Status   SARS Coronavirus 2 by RT PCR NEGATIVE NEGATIVE Final    Comment: (NOTE) SARS-CoV-2 target nucleic acids are NOT DETECTED.  The SARS-CoV-2 RNA is generally detectable in upper respiratory specimens during the acute phase of infection. The lowest concentration of SARS-CoV-2 viral copies this assay can detect is 138 copies/mL. Kirti Carl negative result does not preclude SARS-Cov-2 infection and should not be used as the sole basis for treatment or other patient management decisions. Etta Gassett negative result may occur with  improper specimen collection/handling, submission of specimen other than nasopharyngeal swab, presence of viral mutation(s) within the areas targeted by this assay, and inadequate number of viral copies(<138 copies/mL). Meghanne Pletz negative result must be  combined with clinical observations, patient history, and epidemiological information. The expected result is Negative.  Fact Sheet for Patients:  EntrepreneurPulse.com.au  Fact Sheet for Healthcare Providers:  IncredibleEmployment.be  This test is no t yet approved or cleared by the [place] FDA and  has been authorized for detection and/or diagnosis of SARS-CoV-2 by FDA under an Emergency Use Authorization (EUA). This EUA will remain  in effect (meaning this test can be used) for the duration of the COVID-19 declaration under Section 564(b)(1) of the Act, 21 U.S.C.section 360bbb-3(b)(1), unless the authorization is terminated  or revoked sooner.       Influenza Areatha Kalata by PCR NEGATIVE NEGATIVE Final   Influenza B by PCR NEGATIVE NEGATIVE Final    Comment: (NOTE) The Xpert Xpress SARS-CoV-2/FLU/RSV plus assay is intended as an aid in the diagnosis of influenza from Nasopharyngeal swab specimens and should not be used as Janina Trafton sole basis for treatment. Nasal washings and aspirates are unacceptable for Xpert Xpress SARS-CoV-2/FLU/RSV testing.  Fact Sheet for Patients: EntrepreneurPulse.com.au  Fact Sheet for Healthcare Providers: IncredibleEmployment.be  This test is not yet approved or cleared by the [place] FDA and has been authorized for detection and/or diagnosis of SARS-CoV-2 by FDA under an Emergency Use Authorization (EUA). This EUA will remain in effect (meaning this test can be used) for the duration of the COVID-19 declaration under Section 564(b)(1) of the Act, 21 U.S.C. section 360bbb-3(b)(1), unless the authorization is terminated or revoked.  Performed at [facility], [place] [address] [place], [place] [address]          Radiology Studies: No results found.      Scheduled Meds: . sodium chloride   Intravenous Once  . carbamazepine  200 mg Oral Daily  .  polyethylene glycol  17 g Oral BID  . sertraline  100 mg Oral Daily   Continuous Infusions:   LOS: 4 days    Time spent: over 30 min    Fayrene Helper, MD [facility]   To contact the attending provider between 7A-7P or the covering provider during after hours 7P-7A, please log into the web site [email or web address] and access using universal [place] password for that web site. If you do not have the password, please call the hospital operator.  05/12/2020, 10:04 AM

## 2020-05-12 NOTE — Progress Notes (Addendum)
Manufacturing engineer [facility]) Hospital Liaison note.    Addendum: Chart was reviewed by [facility] MD who deemed pt BP appropriate.  Spoke by phone previously with Mrs Wynetta Emery, pt's aunt, to discuss home with hospice as a possible option.  Multiple calls to Mrs. Wynetta Emery to discuss possibly transferring pt tomorrow to [facility], pending bed availability but unable to speak with Mrs. Wynetta Emery.  Voicemail left.  TOC Eric updated.   [facility] is unable to offer a room today. Hospital Liaison will follow up tomorrow or sooner if a room becomes available. Please do not hesitate to call with questions.    Thank you for the opportunity to participate in this patient's care.  Chrislyn Edison Pace, BSN, RN [place] (listed on [place] under [facility]ice[facility])    [phone]

## 2020-05-12 NOTE — Progress Notes (Signed)
Getting anxious wanting to go smoke. Explanation given cannot smoke in hospital or outside of the hospital. Medicated with Ativan 1mg  IVP given. Will cont to assess neuro status.

## 2020-05-12 NOTE — Plan of Care (Signed)
  Problem: Health Behavior/Discharge Planning: ?Goal: Ability to manage health-related needs will improve ?Outcome: Progressing ?  ?Problem: Clinical Measurements: ?Goal: Will remain free from infection ?Outcome: Progressing ?  ?Problem: Activity: ?Goal: Risk for activity intolerance will decrease ?Outcome: Progressing ?  ?Problem: Nutrition: ?Goal: Adequate nutrition will be maintained ?Outcome: Progressing ?  ?Problem: Coping: ?Goal: Level of anxiety will decrease ?Outcome: Progressing ?  ?Problem: Pain Managment: ?Goal: General experience of comfort will improve ?Outcome: Progressing ?  ?Problem: Safety: ?Goal: Ability to remain free from injury will improve ?Outcome: Progressing ?  ?Problem: Skin Integrity: ?Goal: Risk for impaired skin integrity will decrease ?Outcome: Progressing ?  ?

## 2020-05-12 NOTE — TOC Progression Note (Signed)
Transition of Care [facility]) - Progression Note    Patient Details  Name: Latoya Green MRN: [id number] Date of Birth: May 14, 1956  [facility]) CM/SW Contact  Ross Ludwig, [place] Phone Number: 05/12/2020, 6:22 PM  Clinical Narrative:     CSW received phone call that [facility] may have a bed available over the weekend.  CSW updated physician, bedside nurse, patient, and her aunt.  Weekend TOC to follow up with [facility] for bed availablity.     Expected Discharge Plan and [place] for residential hospice facility placement.                                               Social Determinants of Health (SDOH) Interventions    Readmission Risk Interventions No flowsheet data found.

## 2020-05-13 DIAGNOSIS — N179 Acute kidney failure, unspecified: Secondary | ICD-10-CM | POA: Diagnosis not present

## 2020-05-13 NOTE — Progress Notes (Signed)
[facility] ([facility]) Hospital Liaison note.     This patient is approved to transfer to [facility] today.    [facility] will notify TOC when registration paperwork has been completed to arrange transport.    RN please call report to [phone].   Thank you,     Mary Anne Robertson, RN, CCM       [facility] Hospital Liaison (listed on [facility] under [facility])     [phone]    

## 2020-05-13 NOTE — Progress Notes (Signed)
Report called to Achille Rich, RN at [facility].

## 2020-05-13 NOTE — Progress Notes (Addendum)
CSW received a call from Audrea Muscat with Hospice at [facility] (at ph:Ph: [phone]) stating pt has a bed available today and that if pt is D/C'ing they will need a D/C summary.  Per Bevely Palmer, she will talk to family/obtain consents and will update the CSW when the pt is ready to be transported by [facility].   10:58 AM CSW called [facility] [employer] at ph:[phone] to speak to pt's RN Kim RN [phone]  CSW will continue to follow for D/C needs.  Alphonse Guild. Lynsi Dooner  MSW, LCSW, LCAS, CCS Transitions of Care Clinical Social Worker Care Coordination Department Ph: [phone]

## 2020-05-13 NOTE — Discharge Summary (Signed)
Physician Discharge Summary  Latoya Green TMH:[id number] DOB: Oct 24, 1955 DOA: 05/07/2020  PCP: Latoya Macadam, MD  Admit date: 05/07/2020 Discharge date: 05/13/2020  Time spent: 40 minutes  Recommendations for Outpatient Follow-up:  1. 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[place])   Cancer associated pain   Hypokalemia   Acute on chronic blood loss anemia   Type 2 diabetes mellitus ([place])   Hypertension associated with diabetes ([place])   Hyperlipidemia associated with type 2 diabetes mellitus ([place])   Goals of care, counseling/discussion   Cervical cancer [facility])   Discharge Condition: stable  Diet recommendation: diabetic  Filed Weights   05/07/20 2042 05/08/20 0311 05/08/20 0525  Weight: 77.1 kg 77.1 kg 77.1 kg    History of present illness:  64 y.o.femalewith medical history significant forrecently diagnosed 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 Patient has been having ongoing intermittent vaginal bleeding and hematuria secondary to her invasive cervical cancer.  Patient has been seen by oncology, Latoya Green, and radiation/oncology Latoya Green. She was plannedto start chemotherapy and radiation however MRI pelvis 03/21/2020 showed that the uterine cervix mass was invading the bladder as well as bilateral hydroureteronephrosis. She was seen by urology, Latoya Green, with plan for cystoscopy with retrograde pyelogram/ureteral stent placement before any further therapy could be initiated however patient did not keep her procedure appointment. Patient mentioned that she did not want to pursue any invasive procedures chemotherapy or radiation. She is interested in hospice services.  She was admitted with acute kidney injury, acute on chronic  blood loss anemia related to invasive squamous cell carcinoma of the cervix.  She was seem by palliative care and decided on comfort care, she's declined intervention and treatment for her malignancy.  Plan for inpatient hospice.   Hospital Course:  Goals of care: palliative care following, planning for inpatient hospice -> transfer today. Comfort measures, appreciate palliative care assistance  Acute kidney injurylikely due to obstruction/hypokalemia Presented with Latoya Green creatinine of 2.8. Creatinine was normal Latoya Green month ago. This is most likely related to her invasive cervical cancer involving the urinary bladder. She has known bilateral hydroureteronephrosis. Apparently cystoscopy andstent was planned however patient did not keep that appointment. It looks based on oncology notes patient has not beenfollowing up withoncologist as well. Renal ultrasound was done which showed bilateral hydronephrosis and Latoya Green mass in the urinary bladder.  Patient mentioned that she did not want any further testing or treatment for her cancer. As noted above, comfort measures only  Acute on chronic blood loss anemia This is likely secondary to her vaginal bleeding from the cervical cancer.Was bleeding at presentation, but this seems to have slowed. Hemoglobin was 6.7. She was transfused 1 unit of PRBC.  Hemoglobin noted to be 8.2. Comfort measures as above  Invasive squamous cell carcinoma of the cervix The tumor apparently involves her urinary bladder causing bilateral hydronephrosis. Patient followed by medical oncology, Latoya Green oncology Latoya Green. Latoya Green from urology was also involed. Patient mentioned to the admitting doctor that she did not want any chemotherapy radiation for invasive procedures. Palliative care has been consulted. She is DNR. Pain control as needed. We will try and discuss with GYN oncology to see if there is any therapeutic option available to try and  control her bleeding. Discussed with GYN oncology(Latoya Green  will try to talk to the patient as the only options to stopthe bleeding would be either radiation treatment or embolization or combination of both. Patient previously has mentioned that she is unwilling to consider any invasive procedures or treatment.  Diabetes mellitus type 2 Holding Metformin. SSI.Monitor CBGs  Essential hypertension Holding lisinopril due to renal failure. Occasional high blood pressure readings noted. Continue to monitor.  Hyperlipidemia Continue statin.  History of depression and anxiety Continue sertraline.  Procedures:  none  Consultations:  Palliative care  Gyn onc  Discharge Exam: Vitals:   05/12/20 1312 05/13/20 0716  BP: (!) 172/99 (!) 174/92  Pulse: 81 84  Resp: 20 18  Temp: 98 F (36.7 C) 98.1 F (36.7 C)  SpO2: 97% 98%   No new complaints Called aunt, no answer  Limited exam due to comfort measures General: No acute distress. Lungs: unlabored Neurological: Alert and oriented 3. Moves all extremities 4. Cranial nerves II through XII grossly intact. Skin: Warm and dry. No rashes or lesions. Extremities: No clubbing or cyanosis. No edema.  Discharge Instructions   Discharge Instructions    Diet - low sodium heart healthy   Complete by: As directed    Discharge instructions   Complete by: As directed    We're planning to discharge you to hospice for comfort measures.   Increase activity slowly   Complete by: As directed      Allergies as of 05/13/2020      Reactions   Keflex [cephalexin] Swelling   Pain in her throught.   Penicillins Other (See Comments)   Childhood allergy - has never actually taken medication  Has patient had [name] PCN reaction causing immediate rash, facial/tongue/throat swelling, SOB or lightheadedness with hypotension: Unknown Has patient had [name] PCN reaction causing severe rash involving mucus membranes or skin necrosis:  Unknown Has patient had [name] PCN reaction that required hospitalization: No Has patient had [name] PCN reaction occurring within the last 10 years: No If all of the above answers are "NO", then may proceed with Cephalosporin      Medication List    STOP taking these medications   ibuprofen 200 MG tablet Commonly known as: ADVIL   lisinopril 2.5 MG tablet Commonly known as: ZESTRIL   metFORMIN 1000 MG tablet Commonly known as: GLUCOPHAGE   pravastatin 20 MG tablet Commonly known as: PRAVACHOL     TAKE these medications   Accu-Chek Guide test strip Generic drug: glucose blood USE TO CHECK BLOOD SUGAR DAILY   Accu-Chek Softclix Lancets lancets SMARTSIG:1 Topical Daily   acetaminophen 500 MG tablet Commonly known as: TYLENOL Take 500 mg by mouth every 6 (six) hours as needed.   carbamazepine 200 MG tablet Commonly known as: TEGRETOL Take 200 mg by mouth daily.   FeroSul 325 (65 FE) MG tablet Generic drug: ferrous sulfate Take 650 mg by mouth daily.   hydrOXYzine 25 MG tablet Commonly known as: ATARAX/VISTARIL Take 25 mg by mouth 2 (two) times daily as needed.   morphine 15 MG tablet Commonly known as: MSIR Take 1 tablet (15 mg total) by mouth every 4 (four) hours as needed for severe pain.   sertraline 100 MG tablet Commonly known as: ZOLOFT Take 100 mg by mouth daily.      Allergies  Allergen Reactions  . Keflex [Cephalexin] Swelling    Pain in her throught.  . Penicillins Other (See Comments)    Childhood allergy - has never actually taken medication  Has patient had Joelee Snoke  PCN reaction causing immediate rash, facial/tongue/throat swelling, SOB or lightheadedness with hypotension: Unknown Has patient had Sanaii Caporaso PCN reaction causing severe rash involving mucus membranes or skin necrosis: Unknown Has patient had Adasia Hoar PCN reaction that required hospitalization: No Has patient had Markee Matera PCN reaction occurring within the last 10 years: No If all of the above answers are "NO", then  may proceed with Cephalosporin      The results of significant diagnostics from this hospitalization (including imaging, microbiology, ancillary and laboratory) are listed below for reference.    Significant Diagnostic Studies: US RENAL  Result Date: 05/08/2020 CLINICAL DATA:  Acute kidney injury. History of hypertension and diabetes. EXAM: RENAL / URINARY TRACT ULTRASOUND COMPLETE COMPARISON:  None. FINDINGS: Right Kidney: Renal measurements: 13.5 x 6 x 7.6 cm = volume: 320 mL. Moderate right hydronephrosis. Mild diffuse parenchymal thinning. No mass lesions. Left Kidney: Renal measurements: 15.7 x 6.9 x 7.6 cm = volume: 425 mL. Moderate left renal hydronephrosis. Mild diffuse parenchymal thinning. No focal mass lesions. Bladder: The bladder is decompressed around Daysi Boggan large heterogeneous mass lesion. The mass measures about 11.4 x 7 x 9 cm. This could represent an intrinsic bladder mass or polyp, or Andera Cranmer large intraluminal blood clot. Correlate with urinalysis. Cystoscopy may be useful for further evaluation. Other: None. IMPRESSION: 1. Bilateral renal parenchymal thinning with moderate bilateral hydronephrosis. 2. Large heterogeneous mass in the bladder. Consider polyp, blood clot, or other mass. Cystoscopy may be useful for further evaluation. Electronically Signed   By: Lucienne Capers M.D.   On: 05/08/2020 06:38    Microbiology: Recent Results (from the past 240 hour(s))  Urine culture     Status: Abnormal   Collection Time: 05/08/20 12:02 AM   Specimen: Urine, Random  Result Value Ref Range Status   Specimen Description   Final    URINE, RANDOM Performed at [facility] [address] [place], [place] [address]    Special Requests   Final    NONE Performed at [facility], [place] [address], [place], [place] [address]    Culture MULTIPLE SPECIES PRESENT, SUGGEST RECOLLECTION (Demiya Magno)  Final   Report Status 05/08/2020 FINAL  Final  Resp Panel by RT-PCR  (Flu Rondo Spittler&B, Covid) Nasopharyngeal Swab     Status: None   Collection Time: 05/08/20 12:38 AM   Specimen: Nasopharyngeal Swab; Nasopharyngeal(NP) swabs in vial transport medium  Result Value Ref Range Status   SARS Coronavirus 2 by RT PCR NEGATIVE NEGATIVE Final    Comment: (NOTE) SARS-CoV-2 target nucleic acids are NOT DETECTED.  The SARS-CoV-2 RNA is generally detectable in upper respiratory specimens during the acute phase of infection. The lowest concentration of SARS-CoV-2 viral copies this assay can detect is 138 copies/mL. Renald Haithcock negative result does not preclude SARS-Cov-2 infection and should not be used as the sole basis for treatment or other patient management decisions. Noal Abshier negative result may occur with  improper specimen collection/handling, submission of specimen other than nasopharyngeal swab, presence of viral mutation(s) within the areas targeted by this assay, and inadequate number of viral copies(<138 copies/mL). Jearline Hirschhorn negative result must be combined with clinical observations, patient history, and epidemiological information. The expected result is Negative.  Fact Sheet for Patients:  EntrepreneurPulse.com.au  Fact Sheet for Healthcare Providers:  IncredibleEmployment.be  This test is no t yet approved or cleared by the [place] FDA and  has been authorized for detection and/or diagnosis of SARS-CoV-2 by FDA under an Emergency Use Authorization (EUA). This EUA will  remain  in effect (meaning this test can be used) for the duration of the COVID-19 declaration under Section 564(b)(1) of the Act, 21 U.S.C.section 360bbb-3(b)(1), unless the authorization is terminated  or revoked sooner.       Influenza Annalyse Langlais by PCR NEGATIVE NEGATIVE Final   Influenza B by PCR NEGATIVE NEGATIVE Final    Comment: (NOTE) The Xpert Xpress SARS-CoV-2/FLU/RSV plus assay is intended as an aid in the diagnosis of influenza from Nasopharyngeal swab specimens  and should not be used as Elleanor Guyett sole basis for treatment. Nasal washings and aspirates are unacceptable for Xpert Xpress SARS-CoV-2/FLU/RSV testing.  Fact Sheet for Patients: EntrepreneurPulse.com.au  Fact Sheet for Healthcare Providers: IncredibleEmployment.be  This test is not yet approved or cleared by the [place] FDA and has been authorized for detection and/or diagnosis of SARS-CoV-2 by FDA under an Emergency Use Authorization (EUA). This EUA will remain in effect (meaning this test can be used) for the duration of the COVID-19 declaration under Section 564(b)(1) of the Act, 21 U.S.C. section 360bbb-3(b)(1), unless the authorization is terminated or revoked.  Performed at [facility], [place] [address], [place], [place] [address]      Labs: Basic Metabolic Panel: Recent Labs  Lab 05/07/20 2231 05/08/20 2242 05/09/20 0439  NA 143 139 141  K 2.4* 2.8* 3.0*  CL 97* 100 101  CO2 31 27 27   GLUCOSE 135* 113* 102*  BUN 20 19 19   CREATININE 2.80* 2.94* 2.87*  CALCIUM 8.7* 8.4* 8.7*  MG  --  2.1 2.3   Liver Function Tests: Recent Labs  Lab 05/09/20 0439  AST 17  ALT 10  ALKPHOS 62  BILITOT 0.5  PROT 6.8  ALBUMIN 3.4*   No results for input(s): LIPASE, AMYLASE in the last 168 hours. No results for input(s): AMMONIA in the last 168 hours. CBC: Recent Labs  Lab 05/07/20 2231 05/08/20 2242 05/09/20 0439  WBC 7.3 10.9* 11.0*  NEUTROABS 6.1  --   --   HGB 6.7* 7.5* 8.2*  HCT 22.0* 24.3* 26.4*  MCV 73.8* 77.9* 78.3*  PLT 357 341 353   Cardiac Enzymes: No results for input(s): CKTOTAL, CKMB, CKMBINDEX, TROPONINI in the last 168 hours. BNP: BNP (last 3 results) No results for input(s): BNP in the last 8760 hours.  ProBNP (last 3 results) No results for input(s): PROBNP in the last 8760 hours.  CBG: Recent Labs  Lab 05/08/20 1701 05/08/20 2041 05/09/20 0742 05/09/20 1200 05/09/20 1703  GLUCAP  120* 108* 83 115* 120*       Signed:  Fayrene Helper MD.  [facility] 05/13/2020, 11:15 AM

## 2020-05-15 ENCOUNTER — Ambulatory Visit: Payer: Medicare Other

## 2020-05-16 ENCOUNTER — Ambulatory Visit: Payer: Medicare Other

## 2020-05-17 ENCOUNTER — Ambulatory Visit: Payer: Medicare Other

## 2020-05-18 ENCOUNTER — Ambulatory Visit: Payer: Medicare Other

## 2020-05-19 ENCOUNTER — Ambulatory Visit: Payer: Medicare Other

## 2020-05-22 ENCOUNTER — Ambulatory Visit: Payer: Medicare Other

## 2020-05-23 ENCOUNTER — Ambulatory Visit: Payer: Medicare Other

## 2020-05-24 ENCOUNTER — Ambulatory Visit: Payer: Medicare Other

## 2020-05-25 ENCOUNTER — Ambulatory Visit: Payer: Medicare Other

## 2020-05-26 ENCOUNTER — Ambulatory Visit: Payer: Medicare Other

## 2020-05-29 ENCOUNTER — Ambulatory Visit: Payer: Medicare Other

## 2020-05-30 ENCOUNTER — Ambulatory Visit: Payer: Medicare Other

## 2020-05-31 ENCOUNTER — Ambulatory Visit: Payer: Medicare Other

## 2020-06-01 ENCOUNTER — Ambulatory Visit: Payer: Medicare Other

## 2020-06-02 ENCOUNTER — Ambulatory Visit: Payer: Medicare Other

## 2020-06-05 ENCOUNTER — Ambulatory Visit: Payer: Medicare Other

## 2020-06-06 ENCOUNTER — Ambulatory Visit: Payer: Medicare Other

## 2020-06-07 ENCOUNTER — Ambulatory Visit: Payer: Medicare Other

## 2020-06-08 ENCOUNTER — Ambulatory Visit: Payer: Medicare Other

## 2020-06-09 ENCOUNTER — Ambulatory Visit: Payer: Medicare Other

## 2020-06-12 ENCOUNTER — Ambulatory Visit: Payer: Medicare Other

## 2020-06-13 ENCOUNTER — Ambulatory Visit: Payer: Medicare Other

## 2020-06-14 ENCOUNTER — Ambulatory Visit: Payer: Medicare Other

## 2020-06-15 ENCOUNTER — Ambulatory Visit: Payer: Medicare Other

## 2020-06-16 ENCOUNTER — Ambulatory Visit: Payer: Medicare Other

## 2020-06-17 DEATH — deceased

## 2020-06-19 ENCOUNTER — Ambulatory Visit: Payer: Medicare Other

## 2020-06-20 ENCOUNTER — Ambulatory Visit: Payer: Medicare Other

## 2020-06-21 ENCOUNTER — Ambulatory Visit: Payer: Medicare Other

## 2021-09-09 IMAGING — MR MR PELVIS LIMTED W/O CM
2 series · 33 of 48 positions shown · IV contrast (agent unspecified)
Comparison: 03/09/2020 PET-CT.

CLINICAL DATA: 64-year-old female with new diagnosis of cervical
cancer. Pelvic staging requested.

EXAM:
MRI PELVIS WITHOUT CONTRAST
TECHNIQUE: Incomplete multiplanar multisequence MR imaging of the pelvis was
performed. Due to patient anxiety, the examination is limited and
was discontinued. Only two precontrast MR sequences of the pelvis
were obtained.
CONTRAST:  None.

[Series 2: T2 · coronal · 5.0mm · 1.56mm/px · 21 of 30 slices shown (1 of 2)]
[im 1/30]
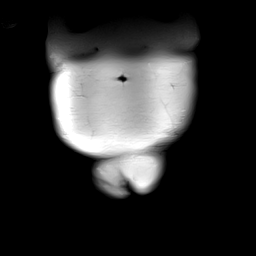
[im 2/30]
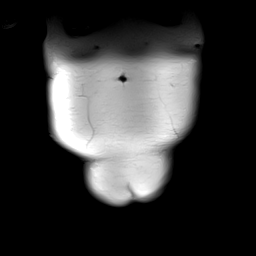
[im 3/30]
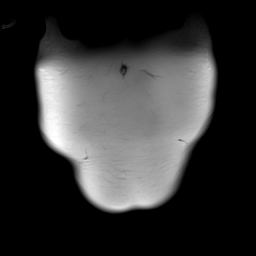
[im 5/30]
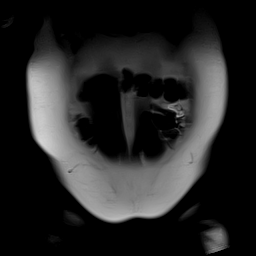
[im 6/30]
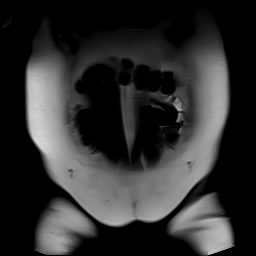
[im 8/30]
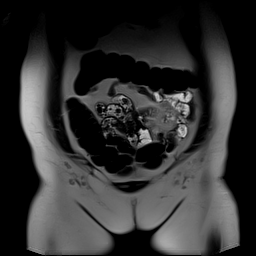
[im 9/30]
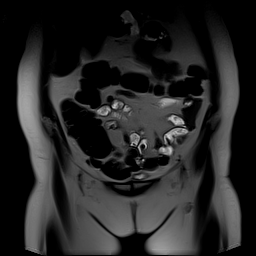
[im 11/30]
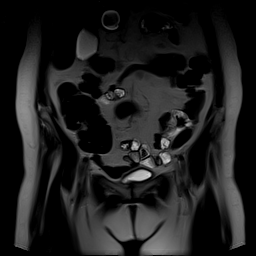
[im 12/30]
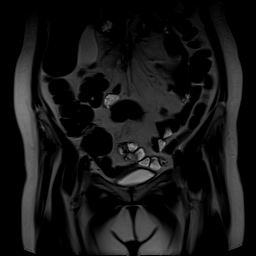
[im 14/30]
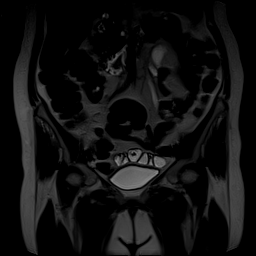
[im 15/30]
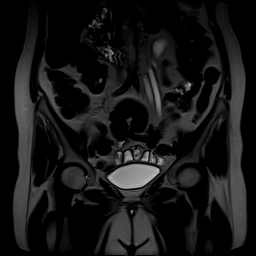
[im 16/30]
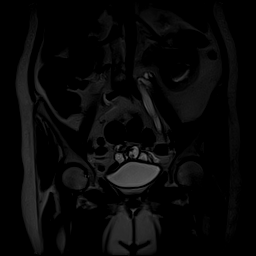
[im 18/30]
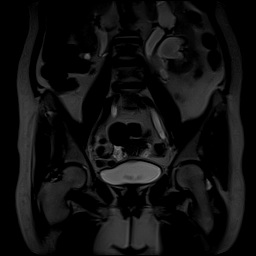
[im 19/30]
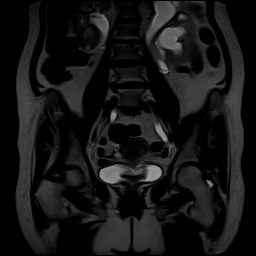
[im 21/30]
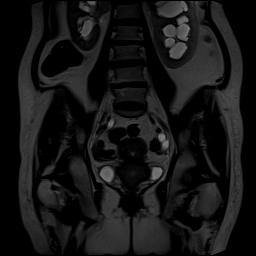
[im 22/30]
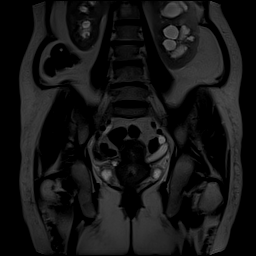
[im 24/30]
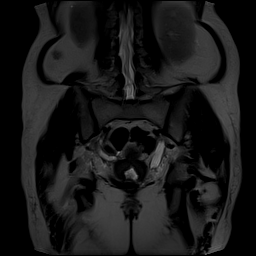
[im 25/30]
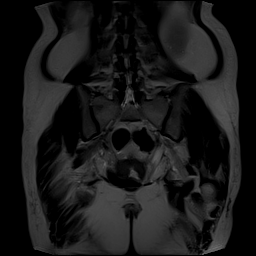
[im 27/30]
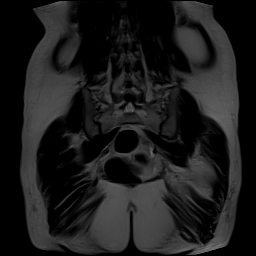
[im 28/30]
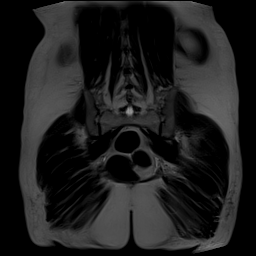
[im 30/30]
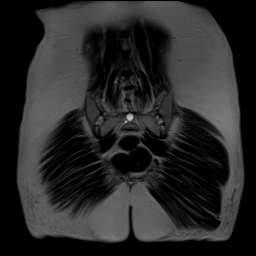

[Series 3: T2 · sagittal · 5.0mm · 0.55mm/px · 12 of 40 slices shown (2 of 2)]
[im 1/40]
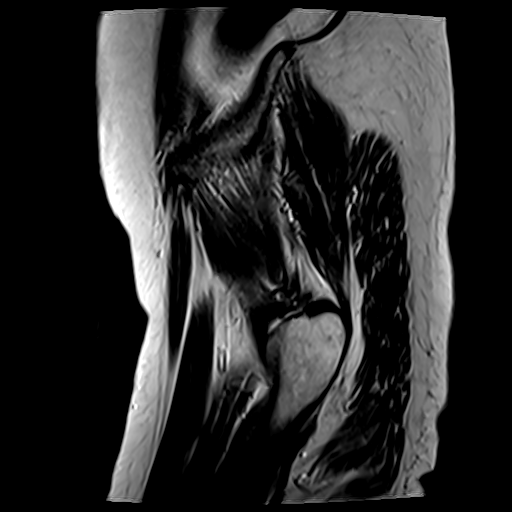
[im 2/40]
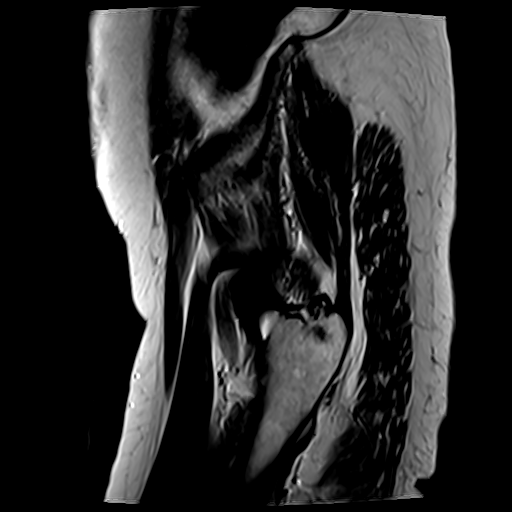
[im 7/40]
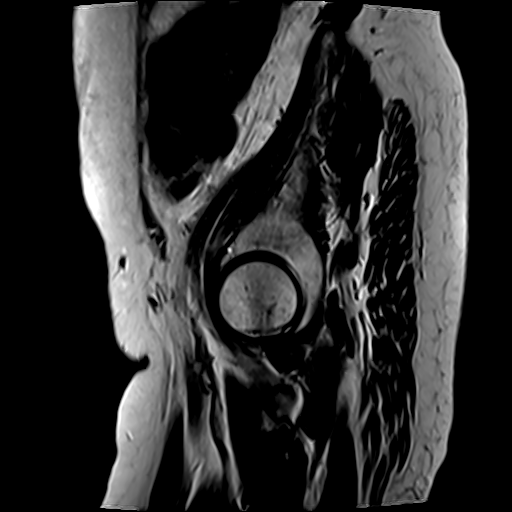
[im 8/40]
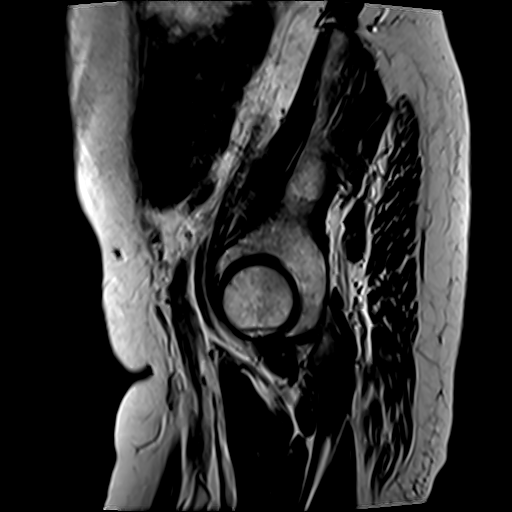
[im 13/40]
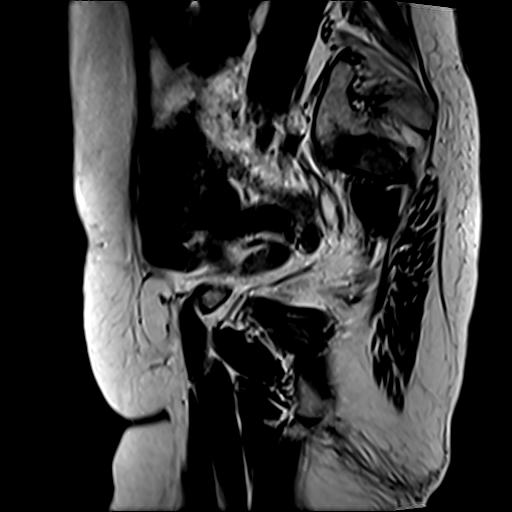
[im 17/40]
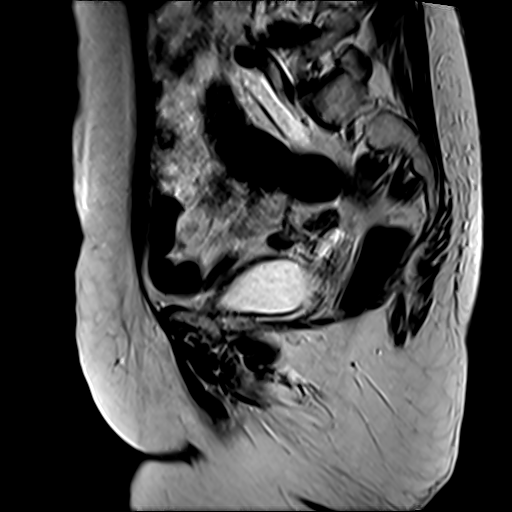
[im 20/40]
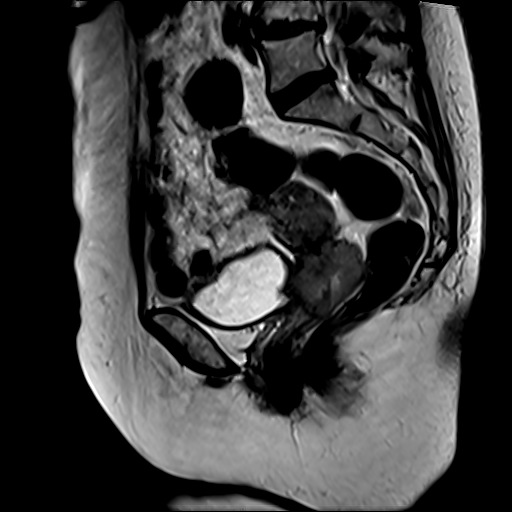
[im 23/40]
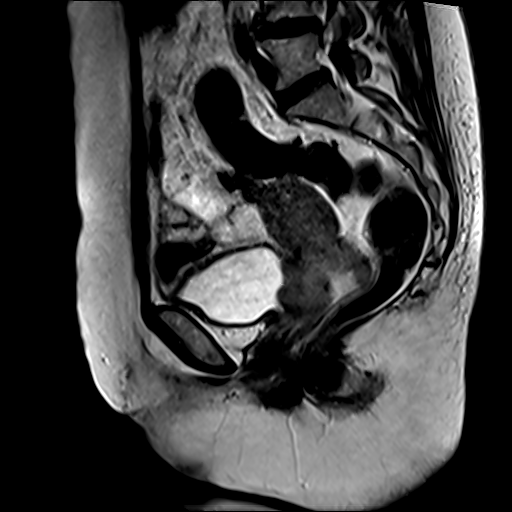
[im 28/40]
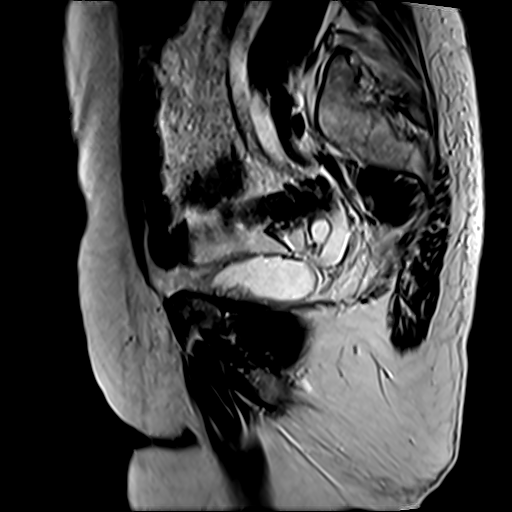
[im 32/40]
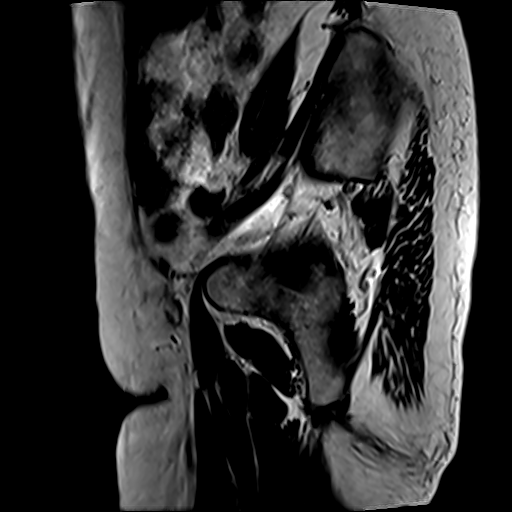
[im 34/40]
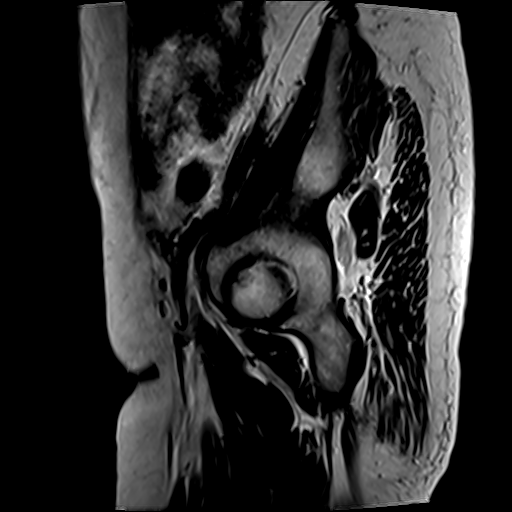
[im 38/40]
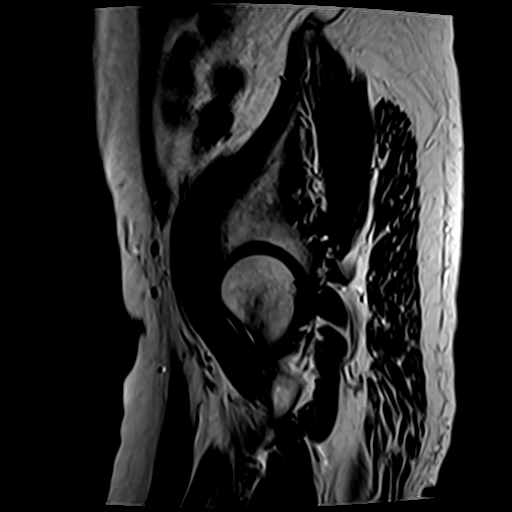

[33 of 48 positions shown; findings below may reference images not displayed]

FINDINGS: Urinary Tract: There is direct invasion of the bladder trigone by
the uterine cervix mass with tumor encasement of the ureterovesical
junctions bilaterally, with apparent focally full thickness bladder
wall invasion in the left trigone on these limited views (series
3/image 25). Moderate right hydroureteronephrosis to the level of
the right UVJ. Severe left hydroureteronephrosis of the completely
duplicated left renal collecting system to the level of the left
UVJ. Grossly normal urethra.

Bowel: The uterine cervix mass abuts the anterior rectal wall,
without definite rectal wall invasion. No evidence of bowel
dilatation or wall thickening. Mild sigmoid diverticulosis.

Vascular/Lymphatic: No pathologically enlarged lymph nodes in the
pelvis. No appreciable acute vascular abnormality on this limited
noncontrast study.

Reproductive:

Uterus: The anteverted uterus measures 7.1 x 3.9 x 4.6 cm. No
uterine fibroids. Endometrium measures 5 mm in bilayer thickness,
which is top-normal.

There is a circumferential 5.4 x 3.5 x 5.1 cm mass of the uterine
cervix (series 3/image 22), which appears to demonstrate slight
parametrial invasion beyond the cervical fibrous stroma in the
posterior right portion (series 2/image 25). There is direct
invasion of the bladder trigone by the mass as detailed above. There
is probable invasion of upper half of the vagina by the cervical
mass, without involvement of the lower half of the vagina.

Ovaries and Adnexa: No adnexal masses on these limited views.

Other: No abnormal free fluid in the pelvis. No focal pelvic fluid
collection.

Musculoskeletal: No aggressive appearing focal osseous lesions.
IMPRESSION: 1. Very limited incomplete noncontrast MRI pelvis study, see
comments.
2. Circumferential 5.4 x 3.5 x 5.1 cm uterine cervix mass, which
appears to demonstrate slight parametrial invasion in the posterior
right portion of the uterine cervix, and direct invasion of the
bladder trigone by the cervical mass. Probable focally full
thickness bladder wall invasion in the left trigone on this limited
study.
3. Moderate right hydroureteronephrosis to the level of the right
UVJ. Severe left hydroureteronephrosis of the completely duplicated
left renal collecting system to the level of the left UVJ.
4. No appreciable pelvic adenopathy or other sites of pelvic
metastatic disease.

## 2021-10-27 IMAGING — US US RENAL
1 series · 14 of 25 positions shown · non-contrast
Comparison: None.

CLINICAL DATA: Acute kidney injury. History of hypertension and
diabetes.

EXAM:
RENAL / URINARY TRACT ULTRASOUND COMPLETE

[Series 1: us renal · 82 acquisitions, 14 frames shown]
[im 1/82]
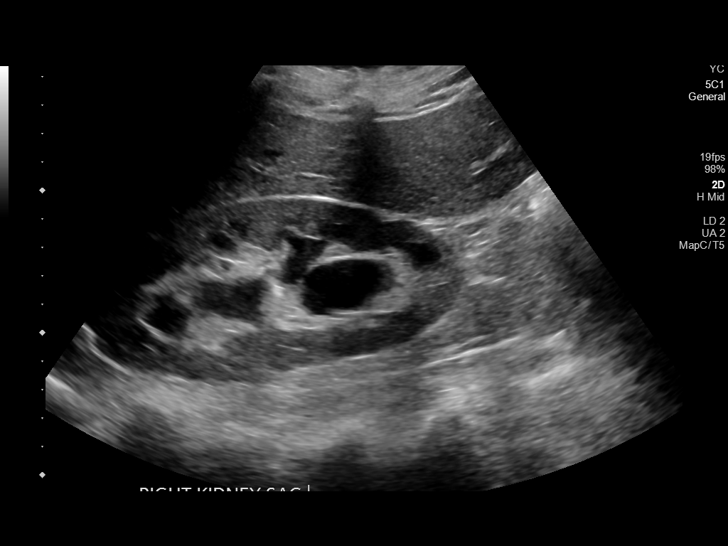
[im 7/82]
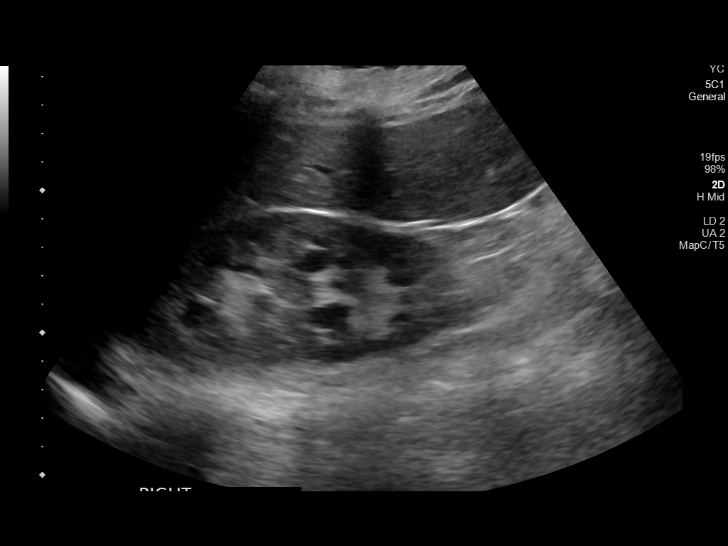
[im 14/82]
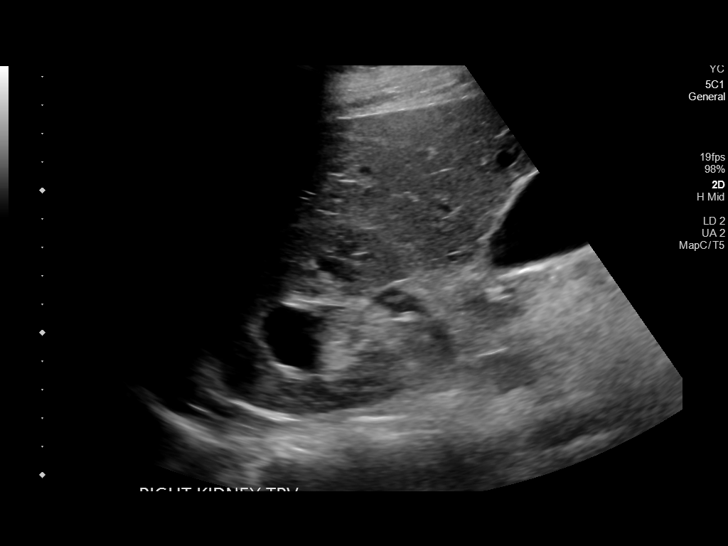
[im 21/82]
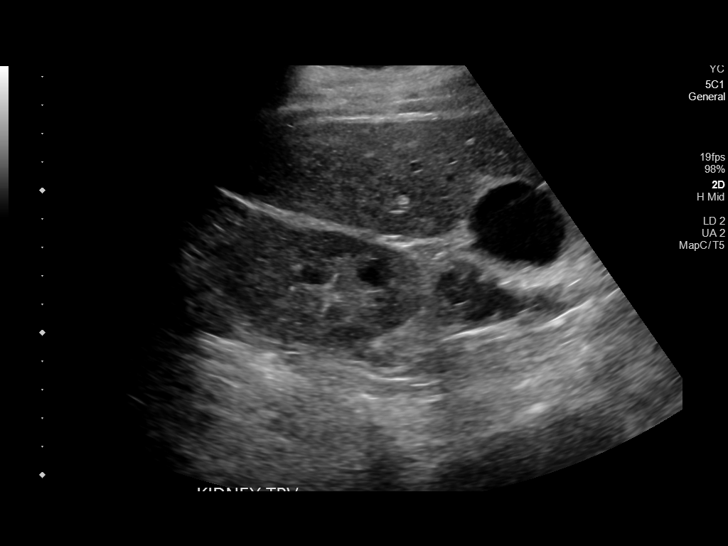
[im 28/82]
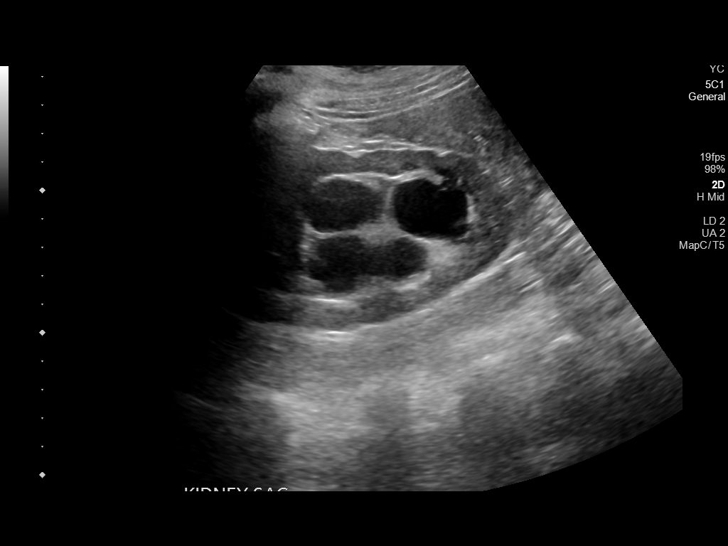
[im 31/82]
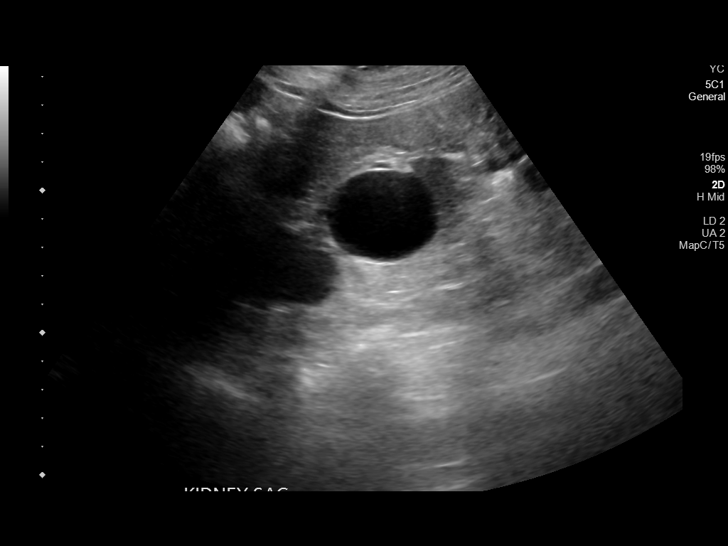
[im 38/82]
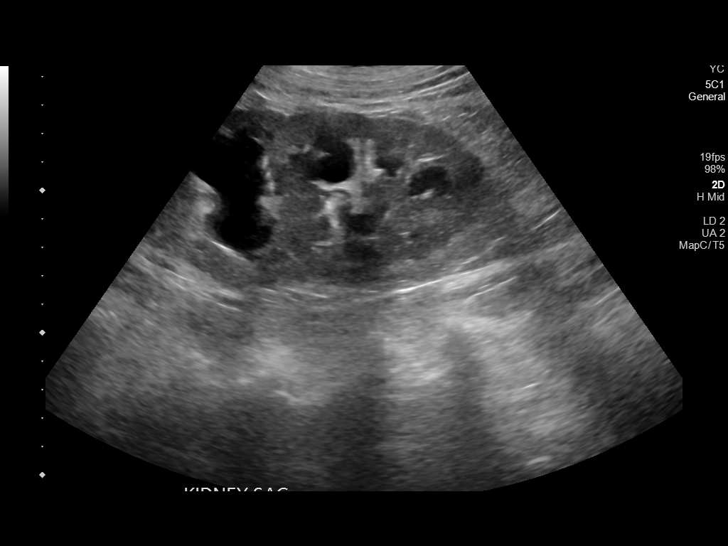
[im 44/82]
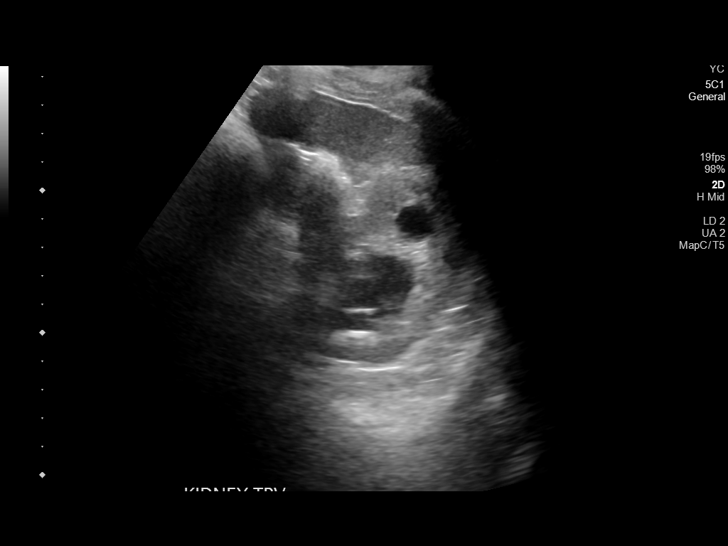
[im 51/82]
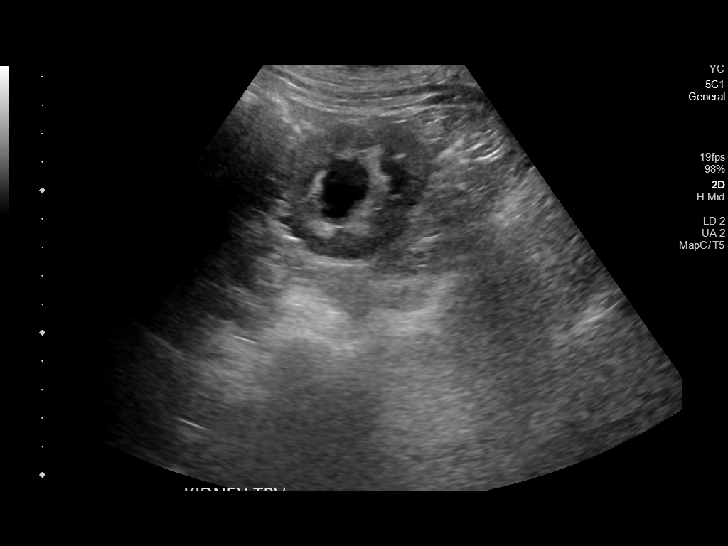
[im 55/82]
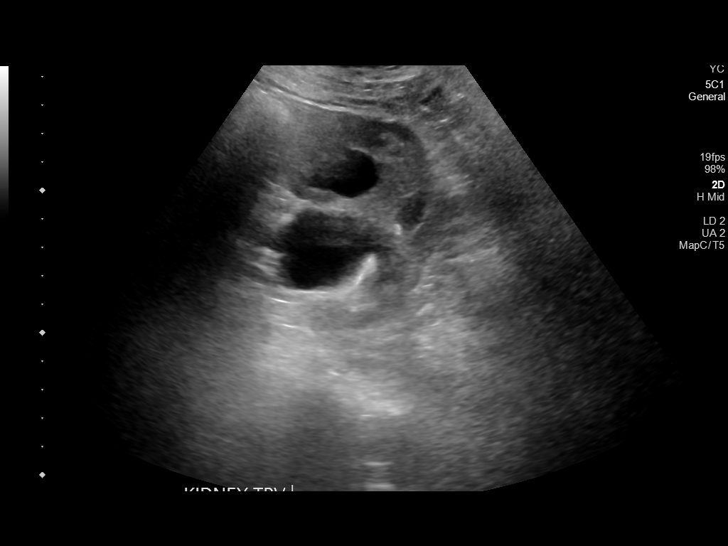
[im 61/82]
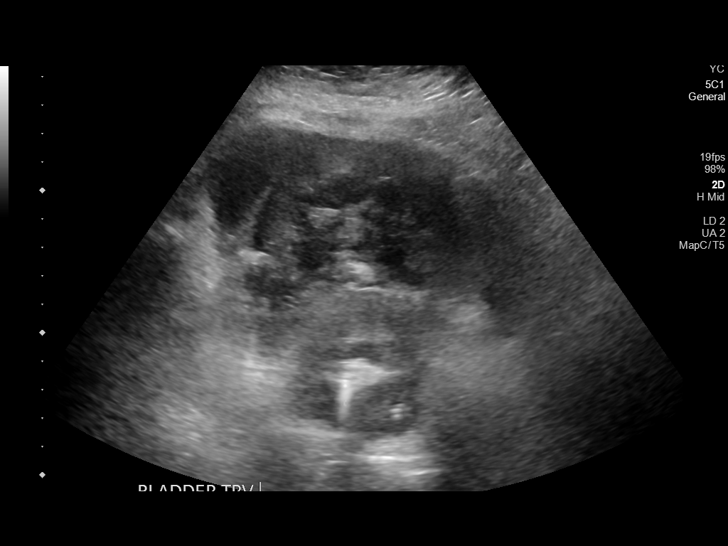
[im 68/82]
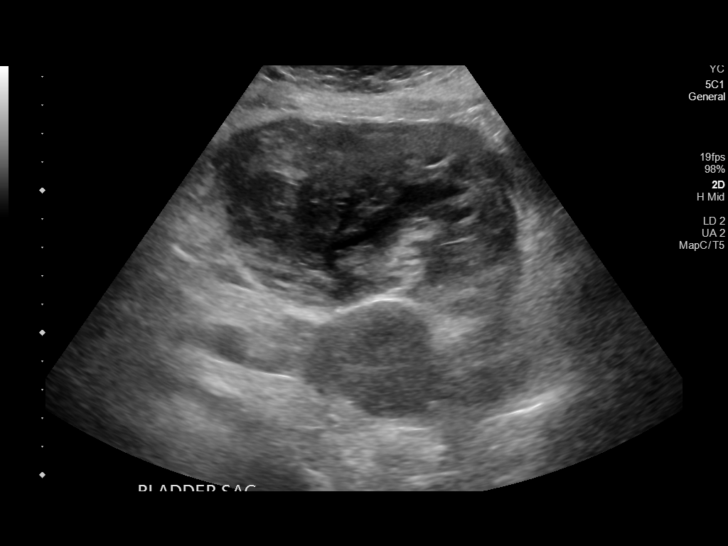
[im 75/82]
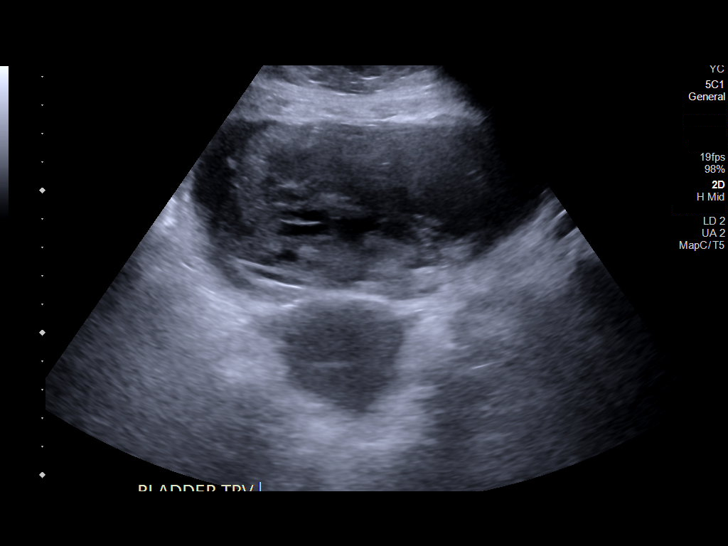
[im 82/82]
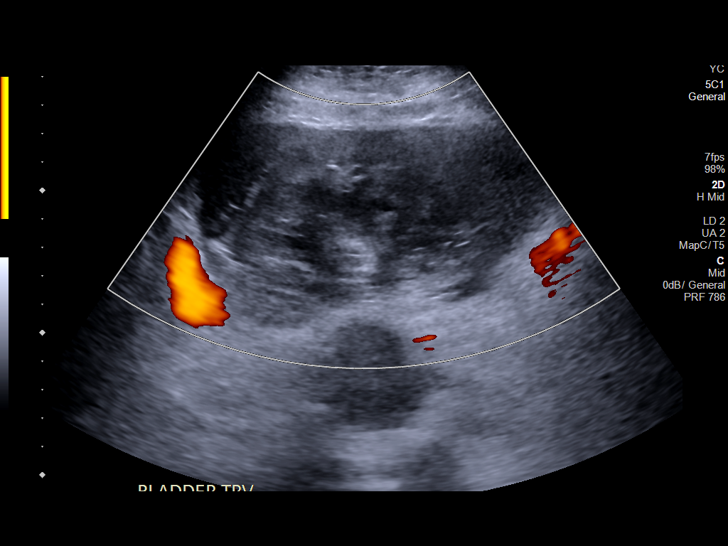

[14 of 25 positions shown; findings below may reference images not displayed]

FINDINGS: Right Kidney:

Renal measurements: 13.5 x 6 x 7.6 cm = volume: 320 mL. Moderate
right hydronephrosis. Mild diffuse parenchymal thinning. No mass
lesions.

Left Kidney:

Renal measurements: 15.7 x 6.9 x 7.6 cm = volume: 425 mL. Moderate
left renal hydronephrosis. Mild diffuse parenchymal thinning. No
focal mass lesions.

Bladder:

The bladder is decompressed around a large heterogeneous mass
lesion. The mass measures about 11.4 x 7 x 9 cm. This could
represent an intrinsic bladder mass or polyp, or a large
intraluminal blood clot. Correlate with urinalysis. Cystoscopy may
be useful for further evaluation.

Other:

None.
IMPRESSION: 1. Bilateral renal parenchymal thinning with moderate bilateral
hydronephrosis.
2. Large heterogeneous mass in the bladder. Consider polyp, blood
clot, or other mass. Cystoscopy may be useful for further
evaluation.
